# Patient Record
Sex: Female | Born: 1955 | Race: White | Hispanic: No | Marital: Married | Smoking: Never smoker
Health system: Southern US, Community
[De-identification: ages and names within clinical notes are randomized; demographics above are authoritative.]

## PROBLEM LIST (undated history)

## (undated) DIAGNOSIS — K219 Gastro-esophageal reflux disease without esophagitis: Secondary | ICD-10-CM

## (undated) DIAGNOSIS — IMO0001 Reserved for inherently not codable concepts without codable children: Secondary | ICD-10-CM

## (undated) DIAGNOSIS — T4145XA Adverse effect of unspecified anesthetic, initial encounter: Secondary | ICD-10-CM

## (undated) DIAGNOSIS — M1990 Unspecified osteoarthritis, unspecified site: Secondary | ICD-10-CM

## (undated) DIAGNOSIS — T8859XA Other complications of anesthesia, initial encounter: Secondary | ICD-10-CM

## (undated) DIAGNOSIS — G4730 Sleep apnea, unspecified: Secondary | ICD-10-CM

## (undated) DIAGNOSIS — R51 Headache: Secondary | ICD-10-CM

## (undated) DIAGNOSIS — F419 Anxiety disorder, unspecified: Secondary | ICD-10-CM

## (undated) DIAGNOSIS — F329 Major depressive disorder, single episode, unspecified: Secondary | ICD-10-CM

## (undated) DIAGNOSIS — F32A Depression, unspecified: Secondary | ICD-10-CM

## (undated) DIAGNOSIS — R519 Headache, unspecified: Secondary | ICD-10-CM

## (undated) DIAGNOSIS — I10 Essential (primary) hypertension: Secondary | ICD-10-CM

## (undated) DIAGNOSIS — R002 Palpitations: Secondary | ICD-10-CM

## (undated) HISTORY — DX: Reserved for inherently not codable concepts without codable children: IMO0001

## (undated) HISTORY — DX: Essential (primary) hypertension: I10

## (undated) HISTORY — PX: NOVASURE ABLATION: SHX5394

## (undated) HISTORY — DX: Palpitations: R00.2

## (undated) HISTORY — PX: KIDNEY STONE SURGERY: SHX686

## (undated) HISTORY — DX: Morbid (severe) obesity due to excess calories: E66.01

## (undated) HISTORY — DX: Depression, unspecified: F32.A

## (undated) HISTORY — DX: Gastro-esophageal reflux disease without esophagitis: K21.9

## (undated) HISTORY — DX: Major depressive disorder, single episode, unspecified: F32.9

## (undated) HISTORY — DX: Anxiety disorder, unspecified: F41.9

## (undated) HISTORY — PX: ANKLE FRACTURE SURGERY: SHX122

## (undated) HISTORY — PX: WISDOM TOOTH EXTRACTION: SHX21

## (undated) HISTORY — PX: OTHER SURGICAL HISTORY: SHX169

---

## 1999-06-06 ENCOUNTER — Encounter: Admission: RE | Admit: 1999-06-06 | Discharge: 1999-06-06 | Payer: Self-pay | Admitting: Obstetrics and Gynecology

## 1999-06-06 ENCOUNTER — Encounter: Payer: Self-pay | Admitting: Obstetrics and Gynecology

## 1999-10-30 ENCOUNTER — Other Ambulatory Visit: Admission: RE | Admit: 1999-10-30 | Discharge: 1999-10-30 | Payer: Self-pay | Admitting: Obstetrics and Gynecology

## 2000-04-10 ENCOUNTER — Encounter: Admission: RE | Admit: 2000-04-10 | Discharge: 2000-04-10 | Payer: Self-pay | Admitting: Emergency Medicine

## 2000-04-10 ENCOUNTER — Encounter: Payer: Self-pay | Admitting: Emergency Medicine

## 2007-06-05 ENCOUNTER — Encounter: Admission: RE | Admit: 2007-06-05 | Discharge: 2007-06-05 | Payer: Self-pay | Admitting: Emergency Medicine

## 2007-06-17 ENCOUNTER — Encounter: Admission: RE | Admit: 2007-06-17 | Discharge: 2007-06-17 | Payer: Self-pay | Admitting: Emergency Medicine

## 2007-06-17 ENCOUNTER — Encounter: Payer: Self-pay | Admitting: Family Medicine

## 2007-09-01 ENCOUNTER — Encounter: Admission: RE | Admit: 2007-09-01 | Discharge: 2007-09-01 | Payer: Self-pay | Admitting: Emergency Medicine

## 2007-09-03 ENCOUNTER — Encounter: Admission: RE | Admit: 2007-09-03 | Discharge: 2007-09-03 | Payer: Self-pay | Admitting: Cardiology

## 2008-08-26 ENCOUNTER — Ambulatory Visit: Payer: Self-pay | Admitting: Family Medicine

## 2008-08-26 DIAGNOSIS — I10 Essential (primary) hypertension: Secondary | ICD-10-CM | POA: Insufficient documentation

## 2008-08-26 DIAGNOSIS — M25519 Pain in unspecified shoulder: Secondary | ICD-10-CM | POA: Insufficient documentation

## 2009-01-13 ENCOUNTER — Ambulatory Visit: Payer: Self-pay | Admitting: Family Medicine

## 2009-01-13 DIAGNOSIS — J309 Allergic rhinitis, unspecified: Secondary | ICD-10-CM | POA: Insufficient documentation

## 2009-01-13 DIAGNOSIS — K219 Gastro-esophageal reflux disease without esophagitis: Secondary | ICD-10-CM | POA: Insufficient documentation

## 2009-03-15 ENCOUNTER — Ambulatory Visit: Payer: Self-pay | Admitting: Family Medicine

## 2009-03-15 DIAGNOSIS — R252 Cramp and spasm: Secondary | ICD-10-CM | POA: Insufficient documentation

## 2009-03-16 LAB — CONVERTED CEMR LAB
ALT: 14 units/L (ref 0–35)
AST: 13 units/L (ref 0–37)
CO2: 24 meq/L (ref 19–32)
Calcium: 9.1 mg/dL (ref 8.4–10.5)
Chloride: 107 meq/L (ref 96–112)
Lymphs Abs: 2.1 10*3/uL (ref 0.7–4.0)
Monocytes Relative: 6 % (ref 3–12)
Neutro Abs: 3.2 10*3/uL (ref 1.7–7.7)
Neutrophils Relative %: 55 % (ref 43–77)
Platelets: 295 10*3/uL (ref 150–400)
RBC: 4.55 M/uL (ref 3.87–5.11)
Sodium: 142 meq/L (ref 135–145)
Total Bilirubin: 0.5 mg/dL (ref 0.3–1.2)
Total CK: 54 units/L (ref 7–177)
Total Protein: 7 g/dL (ref 6.0–8.3)
WBC: 5.8 10*3/uL (ref 4.0–10.5)

## 2009-03-22 ENCOUNTER — Telehealth: Payer: Self-pay | Admitting: Family Medicine

## 2009-03-23 ENCOUNTER — Encounter: Payer: Self-pay | Admitting: Family Medicine

## 2009-03-26 LAB — CONVERTED CEMR LAB
Creatinine, Urine: 282.6 mg/dL
Microalb Creat Ratio: 3.5 mg/g (ref 0.0–30.0)
RBC / HPF: NONE SEEN (ref <3)

## 2009-05-23 ENCOUNTER — Ambulatory Visit: Payer: Self-pay | Admitting: Family Medicine

## 2009-05-23 DIAGNOSIS — M79609 Pain in unspecified limb: Secondary | ICD-10-CM | POA: Insufficient documentation

## 2009-05-24 ENCOUNTER — Encounter: Payer: Self-pay | Admitting: Family Medicine

## 2009-05-24 LAB — CONVERTED CEMR LAB
Folate: 10.9 ng/mL
LDL Cholesterol: 112 mg/dL — ABNORMAL HIGH (ref 0–99)
Triglycerides: 80 mg/dL (ref <150)
VLDL: 16 mg/dL (ref 0–40)
Vit D, 25-Hydroxy: 14 ng/mL — ABNORMAL LOW (ref 30–89)
Vitamin B-12: 237 pg/mL (ref 211–911)

## 2009-07-14 ENCOUNTER — Ambulatory Visit: Payer: Self-pay | Admitting: Family Medicine

## 2009-07-14 DIAGNOSIS — E559 Vitamin D deficiency, unspecified: Secondary | ICD-10-CM | POA: Insufficient documentation

## 2009-07-14 DIAGNOSIS — H6690 Otitis media, unspecified, unspecified ear: Secondary | ICD-10-CM | POA: Insufficient documentation

## 2009-07-15 ENCOUNTER — Encounter: Payer: Self-pay | Admitting: Family Medicine

## 2009-07-24 ENCOUNTER — Telehealth: Payer: Self-pay | Admitting: Family Medicine

## 2009-08-01 ENCOUNTER — Ambulatory Visit: Payer: Self-pay | Admitting: Family Medicine

## 2009-08-01 DIAGNOSIS — R002 Palpitations: Secondary | ICD-10-CM | POA: Insufficient documentation

## 2009-08-03 ENCOUNTER — Encounter: Payer: Self-pay | Admitting: Family Medicine

## 2009-08-04 ENCOUNTER — Encounter: Payer: Self-pay | Admitting: Family Medicine

## 2009-08-15 ENCOUNTER — Telehealth: Payer: Self-pay | Admitting: Family Medicine

## 2009-08-16 ENCOUNTER — Encounter: Payer: Self-pay | Admitting: Family Medicine

## 2009-08-22 ENCOUNTER — Telehealth: Payer: Self-pay | Admitting: Family Medicine

## 2009-10-25 ENCOUNTER — Telehealth: Payer: Self-pay | Admitting: Family Medicine

## 2009-11-03 ENCOUNTER — Telehealth: Payer: Self-pay | Admitting: Family Medicine

## 2009-11-07 ENCOUNTER — Ambulatory Visit: Payer: Self-pay | Admitting: Family Medicine

## 2009-11-07 DIAGNOSIS — F329 Major depressive disorder, single episode, unspecified: Secondary | ICD-10-CM | POA: Insufficient documentation

## 2009-11-07 DIAGNOSIS — F411 Generalized anxiety disorder: Secondary | ICD-10-CM | POA: Insufficient documentation

## 2010-01-09 ENCOUNTER — Ambulatory Visit: Payer: Self-pay | Admitting: Family Medicine

## 2010-01-12 ENCOUNTER — Telehealth: Payer: Self-pay | Admitting: Family Medicine

## 2010-07-15 ENCOUNTER — Encounter: Payer: Self-pay | Admitting: Emergency Medicine

## 2010-07-20 ENCOUNTER — Ambulatory Visit
Admission: RE | Admit: 2010-07-20 | Discharge: 2010-07-20 | Payer: Self-pay | Source: Home / Self Care | Attending: Family Medicine | Admitting: Family Medicine

## 2010-07-22 LAB — CONVERTED CEMR LAB
ALT: 12 units/L (ref 0–35)
AST: 11 units/L (ref 0–37)
Albumin: 4.6 g/dL (ref 3.5–5.2)
Alkaline Phosphatase: 71 units/L (ref 39–117)
MCHC: 32 g/dL (ref 30.0–36.0)
MCV: 90.4 fL (ref 78.0–100.0)
Platelets: 297 10*3/uL (ref 150–400)
Potassium: 4.2 meq/L (ref 3.5–5.3)
RBC: 4.98 M/uL (ref 3.87–5.11)
RDW: 13.9 % (ref 11.5–15.5)
Sodium: 139 meq/L (ref 135–145)
Total Bilirubin: 0.6 mg/dL (ref 0.3–1.2)
Total Protein: 7.2 g/dL (ref 6.0–8.3)

## 2010-07-24 ENCOUNTER — Telehealth (INDEPENDENT_AMBULATORY_CARE_PROVIDER_SITE_OTHER): Payer: Self-pay | Admitting: *Deleted

## 2010-07-24 ENCOUNTER — Telehealth: Payer: Self-pay | Admitting: Family Medicine

## 2010-07-24 NOTE — Progress Notes (Signed)
Summary: Cardiology Results  Phone Note Call from Patient Call back at Home Phone [phone]   Caller: Patient Call For: [name] MD Reason for Call: Lab or Test Results Summary of Call: Patient called and left voice message requesting cardiology test results.  Initial call taken by: Glendell Docker CMA,  August 22, 2009 1:47 PM  Follow-up for Phone Call        Call placed to patient she states she wore a heart monitor about 2 weeks, she states she was informed by Selena Batten the cardiologist that was to have read the test was out of town, and he had not signed off on it. Patient states she was calling back because she had not heard anything Follow-up by: Glendell Docker CMA,  August 22, 2009 1:50 PM  Additional Follow-up for Phone Call Additional follow up Details #1::        Please Call [facility] for report.   Additional Follow-up by: [name] MD,  August 22, 2009 2:48 PM    Additional Follow-up for Phone Call Additional follow up Details #2::    spoke with [facility] at [facility] medical records, she states the report has not been signed off on yet. She is going to check into and have the results faxed to the office once the report is signed off on Follow-up by: Glendell Docker CMA,  August 22, 2009 3:06 PM  Additional Follow-up for Phone Call Additional follow up Details #3:: Details for Additional Follow-up Action Taken: Call pt: WE called and got results today.  NSR, no arrythmias.  The palpitations is just an occ fast rate but not from an arrythmia which is good news. If still having the palpitations then can consider a betablocker to slow the HR down and often this will improve sxs. This would be a daily medication.   08/23/2009 @ 8:07am-Pt given results. Says it actually has not been happening lately and wonders if it was stressed related- I told her it could be but pt will hold off on the med for now and will call back if symptoms start back again. KJ  LPN Additional Follow-up by: [name] MD,  August 22, 2009 4:09 PM   Appended Document: Cardiology Results Pt [facility] stating that she refused medication for heart about 1 month ago but now thinks she needs to start med. Pt would like Rx sent if you still think she needs it. Please advise.Arvilla Market CMAMarcelino Duster October 20, 2009 2:42 PM   April 29, 20113:27 PM OK, med sent. F/U with Dr. B in 1 months to make sure working and tolerating it well. Jarrid Lienhard MD, Santina Evans [Prescriptions]   Pt aware of the above.Arvilla Market CMA, Michelle October 20, 2009 4:19 PM

## 2010-07-24 NOTE — Progress Notes (Signed)
  Phone Note Call from Patient   Summary of Call: Stopped the Metoprolol due to Dr. Cathey Endow thought it was due to caffeine and now heart racing has started back- instructed pt to restart the med and followup with Dr. Linford Arnold. Pt agrees to plan of care Initial call taken by: Kathlene November,  January 12, 2010 8:10 AM

## 2010-07-24 NOTE — Assessment & Plan Note (Signed)
Summary: LOM   Vital Signs:  Patient profile:   55 year old female Height:      67.2 inches Weight:      311 pounds Pulse rate:   84 / minute BP sitting:   130 / 71  (left arm) Cuff size:   regular  Vitals Entered By: Kathlene November (July 14, 2009 11:03 AM) CC: left earache- throbbing, popping and ringing in ear for 3 weeks now   Primary Care Provider:  [name] MD  CC:  left earache- throbbing and popping and ringing in ear for 3 weeks now.  History of Present Illness: left earache- throbbing, popping and ringing in ear for 3 weeks now.  Still feels stopped up.  Pain radiates into her neck.  Has had a dull HA behind the left eye. No fever. Feels "tight in her nose"  Inside of lips are cracking from sleeping wiht mouth open. No dysphagia.  No fever. Not taking any meds for it.  Feels like can't hear out of that ear well. No drainage. No worsenign sxs. Warm heating pad on that side of her face and ear helps her sxs. No ear trauma or prior hx of OM recently.    Current Medications (verified): 1)  Lisinopril 20 Mg Tabs (Lisinopril) .... Take One Tablet By Mouth Once A Day 2)  Calan Sr 240 Mg Cr-Tabs (Verapamil Hcl) .... Take 1 Tablet By Mouth Once A Day 3)  Torsemide 20 Mg Tabs (Torsemide) .... Take One Tablet By Mouth Once A Day As Needed 4)  Zyrtec Allergy 10 Mg Tabs (Cetirizine Hcl) .... Take One Tablet By Mouth Once A Day  Allergies (verified): No Known Drug Allergies  Comments:  Nurse/Medical Assistant: The patient's medications and allergies were reviewed with the patient and were updated in the Medication and Allergy Lists. 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  Has 1 daughter.   Physical Exam  General:  Well-developed,well-nourished,in no acute distress; alert,appropriate and cooperative throughout examination Head:   Normocephalic and atraumatic without obvious abnormalities. No apparent alopecia or balding. Eyes:  No corneal or conjunctival inflammation noted. EOMI. Perrla. Ears:  External ear exam shows no significant lesions or deformities.  Right TM is clear.  Left TM is retracted with yellow fluid. No ertythem or active driange.  Nose:  External nasal examination shows no deformity or inflammation. Nasal mucosa are pink and moist without lesions or exudates. Mouth:  good dentition and no gingival abnormalities.  Left tonsils is swollen.  Neck:  No deformities, masses, or tenderness noted. Lungs:  Normal respiratory effort, chest expands symmetrically. Lungs are clear to auscultation, no crackles or wheezes. Heart:  Normal rate and regular rhythm. S1 and S2 normal without gallop, murmur, click, rub or other extra sounds. Skin:  no rashes.   Cervical Nodes:  No lymphadenopathy noted Psych:  Cognition and judgment appear intact. Alert and cooperative with normal attention span and concentration. No apparent delusions, illusions, hallucinations   Impression & Recommendations:  Problem # 1:  LOM (ICD-382.9) Will treat with augmentin and nasal steroid. Call if not better in one week. Will refer to ENT if not better.  Her updated medication list for this problem includes:    Augmentin 875-125 Mg Tabs (Amoxicillin-pot clavulanate) .Marland Kitchen... Take 1 tablet by mouth two times a day for 10 days  Complete Medication List:  1)  Lisinopril 20 Mg Tabs (Lisinopril) .... Take one tablet by mouth once a day 2)  Calan Sr 240 Mg Cr-tabs (Verapamil hcl) .... Take 1 tablet by mouth once a day 3)  Torsemide 20 Mg Tabs (Torsemide) .... Take one tablet by mouth once a day as needed 4)  Zyrtec Allergy 10 Mg Tabs (Cetirizine hcl) .... Take one tablet by mouth once a day 5)  Augmentin 875-125 Mg Tabs (Amoxicillin-pot clavulanate) .... Take 1 tablet by mouth two times a day for 10 days 6)  Fluticasone Propionate 50 Mcg/act Susp  (Fluticasone propionate) .... 2 sprays in each nostril dialy  Other Orders: T-Vitamin D (25-Hydroxy) ([id number]) Prescriptions: FLUTICASONE PROPIONATE 50 MCG/ACT SUSP (FLUTICASONE PROPIONATE) 2 sprays in each nostril dialy  #1 bottle x 0   Entered and Authorized by:   [name] MD   Signed by:   [name] MD on 07/14/2009   Method used:   [facility]  [address] [phone]       [address]       [place], [place]  [address]       Ph: [phone] or [phone]       Fax: [phone]   RxID:   [id number] AUGMENTIN 875-125 MG TABS (AMOXICILLIN-POT CLAVULANATE) Take 1 tablet by mouth two times a day for 10 days  #20 x 0   Entered and Authorized by:   [name] MD   Signed by:   [name] MD on 07/14/2009   Method used:   [facility] [phone]* (retail)       [address]       [place], [place]  [address]       Ph: [phone] or [phone]       Fax: [phone]   RxID:   [id number]

## 2010-07-24 NOTE — Procedures (Signed)
Summary: [facility]   Imported By: Lanelle Bal 08/28/2009 14:18:34  _____________________________________________________________________  External Attachment:    Type:   Image     Comment:   External Document

## 2010-07-24 NOTE — Letter (Signed)
Summary: [facility]  [facility]   Imported By: Lanelle Bal 08/23/2009 10:13:15  _____________________________________________________________________  External Attachment:    Type:   Image     Comment:   External Document

## 2010-07-24 NOTE — Progress Notes (Signed)
Summary: No changes since starting metoprolol   Phone Note Call from Patient   Caller: Patient Summary of Call: Pt called stating she started the Metoprolol as directed for rapid heart beat. Pt states she has not noticed any changes in the heart rate or palpataions. She states she is still having rapid heart beat in the morning. I advised Pt that she has not been on medication very long and it may not have had enough time to get into her system but I would ask Dr. Cathey Endow if she agrees or has any other suggestions. Please advise. Initial call taken by: Payton Spark CMA,  Nov 03, 2009 9:39 AM  Follow-up for Phone Call        she has not been seen since Feb. Stay on current dose and schedule OV next wk. Follow-up by: Seymour Bars DO,  Nov 03, 2009 10:11 AM     Appended Document: No changes since starting metoprolol  Pt aware. Will call back to schedule apt

## 2010-07-24 NOTE — Progress Notes (Signed)
Summary: BP and meds  Phone Note Call from Patient Call back at Home Phone [phone]   Caller: Patient Call For: [name] MD Summary of Call: Pt calls and states you put her on Metoprolol and she was wondering does she need to keep taking her regular BP meds along with this. She says at times her BP is 112/72 and didn't want it to drop real low so confused as to what to do. Please advise Initial call taken by: Kathlene November,  Oct 25, 2009 8:50 AM  Follow-up for Phone Call        Yes this would be in addition to other meds.  BP is OK but if feeling lightheaded then can cut the lisinopril in half and this should compensate for the new medicine.  Follow-up by: [name] MD,  Oct 25, 2009 10:06 AM  Additional Follow-up for Phone Call Additional follow up Details #1::        Pt notified of above instructions. kJ LPN Additional Follow-up by: Kathlene November,  Oct 25, 2009 10:17 AM

## 2010-07-24 NOTE — Progress Notes (Signed)
Summary: Referral to ENT  Phone Note Call from Patient Call back at Home Phone [phone]   Caller: Patient Call For: [name] MD Summary of Call: pt seen and given antibiotic for her ear and has finished the med but still having pain behind the ear and was told you would refer if still hurting Initial call taken by: Kathlene November,  July 24, 2009 11:26 AM  Follow-up for Phone Call        OK. printed.  Follow-up by: [name] MD,  July 24, 2009 11:58 AM

## 2010-07-24 NOTE — Assessment & Plan Note (Signed)
Summary: f/u palptations/ wt   Vital Signs:  Patient profile:   55 year old female Height:      67.2 inches Weight:      314 pounds BMI:     49.06 O2 Sat:      100 % on Room air Pulse rate:   65 / minute BP sitting:   139 / 75  (left arm) Cuff size:   large  Vitals Entered By: Payton Spark CMA (Nov 07, 2009 11:03 AM)  O2 Flow:  Room air CC: Discuss palpitations and metoprolol   Primary Care Provider:  [name] MD  CC:  Discuss palpitations and metoprolol.  History of Present Illness: 55 yo WF presents for heart palpitations.  She wore  a heart monitor 2 mos ago and it was NORMAL.  She was tried on Metoprolol for the past month.  She thought that the metoprolol had helped.  She is having some palptations but they are not everyday.  She is only having them in the middle of the night which she thinks is from worrying too much.  She has GERD which sometimes causes CP or DOE.  She did not see the cardiologsit back after she did the testing.   She had normal labs in Feb - CBC, TSH, CMP. Her EKG was normal.  She is under a lot of stress lately.  She admits to feeling anxious.  She drinks a lot of caffeine.  She does not smoke.  Lately, she has not been feeling acid reflux with her heart palpitations but she is morbidly obese.    Current Medications (verified): 1)  Lisinopril 20 Mg Tabs (Lisinopril) .... Take One Tablet By Mouth Once A Day 2)  Calan Sr 240 Mg Cr-Tabs (Verapamil Hcl) .... Take 1 Tablet By Mouth Once A Day 3)  Torsemide 20 Mg Tabs (Torsemide) .... Take One Tablet By Mouth Once A Day As Needed 4)  Zyrtec Allergy 10 Mg Tabs (Cetirizine Hcl) .... Take One Tablet By Mouth Once A Day 5)  Fluticasone Propionate 50 Mcg/act Susp (Fluticasone Propionate) .... 2 Sprays in Each Nostril Dialy 6)  Metoprolol Succinate 25 Mg Xr24h-Tab (Metoprolol Succinate) .... Take 1 Tablet By Mouth Once A Day in The Am  Allergies (verified): No Known Drug Allergies  Past  History:  Past Medical History: Stress test 2009 depression / anxiety morbid obesity HTN reflux esophagitis  Past Surgical History: Reviewed history from 08/26/2008 and no changes required. None  Social History: Reviewed history from 08/01/2009 and no changes required. 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   Review of Systems      See HPI  Physical Exam  General:  morbidly obese WF in NAD Mouth:  pharynx pink and moist.   Neck:  no masses.   Lungs:  Normal respiratory effort, chest expands symmetrically. Lungs are clear to auscultation, no crackles or wheezes. Heart:  Normal rate and regular rhythm. S1 and S2 normal without gallop, murmur, click, rub or other extra sounds. Extremities:  trace ankle edema bilat Neurologic:  no tremor Skin:  color normal.   Psych:  flat affect.     Impression & Recommendations:  Problem # 1:  PALPITATIONS (ICD-785.1) Had a normal cardiac monitoring test in March and normal labs in Feb.  Improved a little on metoprolol.  Only occuring in the middle of th night suggesting association with anxiety or acid reflux.  Avoid caffeine  and late night eating.  Will work on stress reduction and treat mood (see #2).  Continue BB for now. Her updated medication list for this problem includes:    Metoprolol Succinate 25 Mg Xr24h-tab (Metoprolol succinate) .Marland Kitchen... Take 1 tablet by mouth once a day in the am  Problem # 2:  DEPRESSION, MILD (ICD-311) PHQ-9 score of 8 c/w mild depression with some anxiety which may be triggering nighttime heart palpitaitons and binge eating.   Will start her on Wellbutrin once a day.  Call if any problems.  RTC in 1 month to see how she is doing and work on wt [employer]. Her updated medication list for this problem includes:    Wellbutrin Xl 150 Mg Xr24h-tab (Bupropion hcl) .Marland Kitchen... 1 tab by mouth qam  Problem # 3:  MORBID OBESITY (ICD-278.01) BMI 49 c/w class III  obesity. Spent 20 min face to face counseling about mood, weight managment and a plan to move her towards a healthier weight.  Complete Medication List: 1)  Lisinopril 20 Mg Tabs (Lisinopril) .... Take one tablet by mouth once a day 2)  Calan Sr 240 Mg Cr-tabs (Verapamil hcl) .... Take 1 tablet by mouth once a day 3)  Torsemide 20 Mg Tabs (Torsemide) .... Take one tablet by mouth once a day as needed 4)  Zyrtec Allergy 10 Mg Tabs (Cetirizine hcl) .... Take one tablet by mouth once a day 5)  Fluticasone Propionate 50 Mcg/act Susp (Fluticasone propionate) .... 2 sprays in each nostril dialy 6)  Metoprolol Succinate 25 Mg Xr24h-tab (Metoprolol succinate) .... Take 1 tablet by mouth once a day in the am 7)  Wellbutrin Xl 150 Mg Xr24h-tab (Bupropion hcl) .Marland Kitchen.. 1 tab by mouth qam  Patient Instructions: 1)  Stay on current meds. 2)  Add Wellbutrin once a day for mood. 3)  Work on [employer], regular physical activity. 4)  Avoid caffeine and late night eating. 5)  REturn for f/u in 1 month. Prescriptions: WELLBUTRIN XL 150 MG XR24H-TAB (BUPROPION HCL) 1 tab by mouth qAM  #30 x 1   Entered and Authorized by:   Seymour Bars DO   Signed by:   Seymour Bars DO on 11/07/2009   Method used:   [facility] [phone]* (retail)       [address]       [place], [place]  [address]       Ph: [phone] or [phone]       Fax: [phone]   RxID:   [id number]

## 2010-07-24 NOTE — Progress Notes (Signed)
Summary: Results  Phone Note Call from Patient Call back at Home Phone [phone]   Caller: Patient Call For: [name] MD Summary of Call: Pt calls and wanted to know recommendations from ENT that she went and seen and also results of halter monitor that she wore Initial call taken by: Kathlene November,  August 15, 2009 10:00 AM  Follow-up for Phone Call        I don't have the holter monitor results yet. Can we call and get those. Need to call [facility] if hasn't heard from MD. From the note I got he wanted to review your MRI first and then call you.  Follow-up by: [name] MD,  August 15, 2009 11:18 AM  Additional Follow-up for Phone Call Additional follow up Details #1::        Diane from [facility] stating MD has not signed off on report. Diane will have MD sign and fax  on Thurs. Additional Follow-up by: Payton Spark CMA,  August 15, 2009 4:20 PM    Additional Follow-up for Phone Call Additional follow up Details #2::    Pt notified that as soon as MD at cardiologists signs the report we will get a copy and then call her with results. Instructed her to call [facility] and ask MD she seen what the plan is once reviewed the MRI- pt agrees with plan Follow-up by: Kathlene November,  August 15, 2009 4:23 PM

## 2010-07-24 NOTE — Letter (Signed)
Summary: Depression Questionnaire  Depression Questionnaire   Imported By: Lanelle Bal 11/16/2009 13:19:20  _____________________________________________________________________  External Attachment:    Type:   Image     Comment:   External Document

## 2010-07-24 NOTE — Consult Note (Signed)
Summary: [facility]  [facility]   Imported By: Lanelle Bal 08/11/2009 10:13:23  _____________________________________________________________________  External Attachment:    Type:   Image     Comment:   External Document

## 2010-07-24 NOTE — Assessment & Plan Note (Signed)
Summary: f/u mood/ wt   Vital Signs:  Patient profile:   55 year old female Height:      67.2 inches Weight:      320 pounds BMI:     50.00 O2 Sat:      98 % on Room air Pulse rate:   64 / minute BP sitting:   144 / 72  (left arm) Cuff size:   large  Vitals Entered By: Payton Spark CMA (January 09, 2010 4:18 PM)  O2 Flow:  Room air CC: F/U mood and weight.   Primary Care Provider:  [name] MD  CC:  F/U mood and weight..  History of Present Illness: 55 yo WF presents for f/u mood and weight.  She is on Wellbutrin for her mood.  It has helped.  Her PHQ9 has improved from and 8-->5.  Her home BPs have been <140/90.  She wants to come off Metoprolol becuase she has cut back on her caffeiene which has improved her heart palpitations.  She takes other BP meds.  She has failed to lose any weight.      Current Medications (verified): 1)  Lisinopril 20 Mg Tabs (Lisinopril) .... Take One Tablet By Mouth Once A Day 2)  Calan Sr 240 Mg Cr-Tabs (Verapamil Hcl) .... Take 1 Tablet By Mouth Once A Day 3)  Torsemide 20 Mg Tabs (Torsemide) .... Take One Tablet By Mouth Once A Day As Needed 4)  Zyrtec Allergy 10 Mg Tabs (Cetirizine Hcl) .... Take One Tablet By Mouth Once A Day 5)  Fluticasone Propionate 50 Mcg/act Susp (Fluticasone Propionate) .... 2 Sprays in Each Nostril Dialy 6)  Metoprolol Succinate 25 Mg Xr24h-Tab (Metoprolol Succinate) .... Take 1 Tablet By Mouth Once A Day in The Am 7)  Wellbutrin Xl 150 Mg Xr24h-Tab (Bupropion Hcl) .Marland Kitchen.. 1 Tab By Mouth Qam  Allergies (verified): No Known Drug Allergies  Past History:  Past Medical History: Reviewed history from 11/07/2009 and no changes required. Stress test 2009 depression / anxiety morbid obesity HTN reflux esophagitis  Past Surgical History: Reviewed history from 08/26/2008 and no changes required. None  Family History: Reviewed history from 08/01/2009 and no changes required. PGM with cancer Father  with MI prior age 71, decreased.  Family History of CAD Female 1st degree relative <50 M aunt with DM  Mother with pacemaker for irregular HR.   Social History: Reviewed history from 08/01/2009 and no changes required. 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   Review of Systems      See HPI  Physical Exam  General:  alert, well-developed, well-nourished, and well-hydrated.  obese Head:  normocephalic and atraumatic.   Mouth:  good dentition and pharynx pink and moist.   Neck:  no masses.   Lungs:  Normal respiratory effort, chest expands symmetrically. Lungs are clear to auscultation, no crackles or wheezes. Heart:  Normal rate and regular rhythm. S1 and S2 normal without gallop, murmur, click, rub or other extra sounds. Skin:  color normal.   Cervical Nodes:  No lymphadenopathy noted Psych:  good eye contact, not anxious appearing, and not depressed appearing.     Impression & Recommendations:  Problem # 1:  PALPITATIONS (ICD-785.1) Will stop the Metoprolol.  She is on Verapamil anyway so should not need this.  Had a normal cardiac w/u. The following medications were removed from the medication list:    Metoprolol Succinate 25 Mg  Xr24h-tab (Metoprolol succinate) .Marland Kitchen... Take 1 tablet by mouth once a day in the am  Problem # 2:  ALLERGIC RHINITIS (ICD-477.9) Continue zyrtec and Flonase. Her updated medication list for this problem includes:    Zyrtec Allergy 10 Mg Tabs (Cetirizine hcl) .Marland Kitchen... Take one tablet by mouth once a day    Fluticasone Propionate 50 Mcg/act Susp (Fluticasone propionate) .Marland Kitchen... 2 sprays in each nostril dialy  Problem # 3:  DEPRESSION, MILD (ICD-311) PHQ -9 score of 5, down from 8.  Continue Wellbutrin.  Work on wt loss/ exercise. F/U in 4 mos with dr Judie Petit. Her updated medication list for this problem includes:    Wellbutrin Xl 150 Mg Xr24h-tab (Bupropion hcl) .Marland Kitchen... 1 tab by mouth qam  Complete  Medication List: 1)  Lisinopril 20 Mg Tabs (Lisinopril) .... 1/2 tab by mouth daily 2)  Calan Sr 240 Mg Cr-tabs (Verapamil hcl) .... Take 1 tablet by mouth once a day 3)  Torsemide 20 Mg Tabs (Torsemide) .... Take one tablet by mouth once a day as needed 4)  Zyrtec Allergy 10 Mg Tabs (Cetirizine hcl) .... Take one tablet by mouth once a day 5)  Fluticasone Propionate 50 Mcg/act Susp (Fluticasone propionate) .... 2 sprays in each nostril dialy 6)  Wellbutrin Xl 150 Mg Xr24h-tab (Bupropion hcl) .Marland Kitchen.. 1 tab by mouth qam  Patient Instructions: 1)  Stop Metoprolol. 2)  Return for follow up with Dr Linford Arnold in 4 mos. Prescriptions: WELLBUTRIN XL 150 MG XR24H-TAB (BUPROPION HCL) 1 tab by mouth qAM  #30 x 3   Entered and Authorized by:   Seymour Bars DO   Signed by:   Seymour Bars DO on 01/09/2010   Method used:   [facility]        [facility] [phone]* (retail)       [address]       [place], [place]  [address]       Ph: [phone] or [phone]       Fax: [phone]   RxID:   [id number] LISINOPRIL 20 MG TABS (LISINOPRIL) 1/2 tab by mouth daily  #30 x 3   Entered and Authorized by:   Seymour Bars DO   Signed by:   Seymour Bars DO on 01/09/2010   Method used:   [facility]        [facility] [phone]* (retail)       [address]       [place], [place]  [address]       Ph: [phone] or [phone]       Fax: [phone]   RxID:   [id number] CALAN SR 240 MG CR-TABS (VERAPAMIL HCL) Take 1 tablet by mouth once a day  #60 Tablet x 6   Entered and Authorized by:   Seymour Bars DO   Signed by:   Seymour Bars DO on 01/09/2010   Method used:   [facility] [phone]* (retail)       [address]       [place], [place]  [address]       Ph: [phone] or [phone]       Fax: [phone]   RxID:   [id number]

## 2010-07-24 NOTE — Assessment & Plan Note (Signed)
Summary: Palpitations   Vital Signs:  Patient profile:   55 year old female Height:      67.2 inches Weight:      308.75 pounds Pulse rate:   87 / minute BP sitting:   136 / 78  Vitals Entered By: Kandice Hams (August 01, 2009 2:51 PM) CC: c/o heart racing, early in am heartburn   Primary Care Provider:  [name] MD  CC:  c/o heart racing and early in am heartburn.  History of Present Illness: c/o heart racing, early in am heartburn. Will wake up in the AM and feel heartracing for about 1 minutes. Last night happened and checked her BP and it was elevated. Waited a few mintues and then the BP went back down.  Wore a heart monitor about 2 years and told had an irregular heartbeat and then saw Cardiology who felt there was nothing wrong.  Was having "skipped beast senastion" back then. No SOB but does have nasal congestion. No chage in Caffeine intake.  Also had a treadmill stress test about 2 years ago. Heart racing started a couple of weeks ago and has been most mornings but not every morning. No CP.    Allergies: No Known Drug Allergies  Past History:  Past Medical History: Last updated: 08/26/2008 Stress test 2009  Family History: Last updated: 08/01/2009 PGM with cancer Father with MI prior age 80, decreased.  Family History of CAD Female 1st degree relative <50 M aunt with DM  Mother with pacemaker for irregular HR.   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   Family History: PGM with cancer Father with MI prior age 65, decreased.  Family History of CAD Female 1st degree relative <50 M aunt with DM  Mother with pacemaker for irregular HR.   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  Married to [employer].  Has 1 daughter.   Physical Exam  General:  Well-developed,well-nourished,in no acute distress; alert,appropriate  and cooperative throughout examination Head:  Normocephalic and atraumatic without obvious abnormalities. No apparent alopecia or balding. Eyes:  No corneal or conjunctival inflammation noted. EOMI. Perrla.  Ears:  External ear exam shows no significant lesions or deformities.  Otoscopic examination reveals clear canals, tympanic membranes are intact bilaterally without bulging, retraction, inflammation or discharge. Hearing is grossly normal bilaterally. Nose:  External nasal examination shows no deformity or inflammation. Nasal mucosa are pink and moist without lesions or exudates. Mouth:  Oral mucosa and oropharynx without lesions or exudates.  Teeth in good repair. Neck:  No deformities, masses, or tenderness noted. No Tm.  Lungs:  Normal respiratory effort, chest expands symmetrically. Lungs are clear to auscultation, no crackles or wheezes. Heart:  Normal rate and regular rhythm. S1 and S2 normal without gallop, murmur, click, rub or other extra sounds. Pulses:  Radial 2+  Extremities:  No LE edema.  Neurologic:  alert & oriented X3.   Skin:  no rashes.   Psych:  Cognition and judgment appear intact. Alert and cooperative with normal attention span and concentration. No apparent delusions, illusions, hallucinations   Impression & Recommendations:  Problem # 1:  PALPITATIONS (ICD-785.1) EKG shows NSR, rate of 72bpm.  No acute changes.   Will rule out thyroid d/o or anemia. If normal then wil schedule for Heart monitor.   Does have some anxiety but doesn't  feel anything has changed from her baseline.  Orders: T-CBC No Diff ([id number]) T-Comprehensive Metabolic Panel ([id number]) T-TSH ([id number]) EKG w/ Interpretation (93000)  Complete Medication List: 1)  Lisinopril 20 Mg Tabs (Lisinopril) .... Take one tablet by mouth once a day 2)  Calan Sr 240 Mg Cr-tabs (Verapamil hcl) .... Take 1 tablet by mouth once a day 3)  Torsemide 20 Mg Tabs (Torsemide) .... Take one tablet by  mouth once a day as needed 4)  Zyrtec Allergy 10 Mg Tabs (Cetirizine hcl) .... Take one tablet by mouth once a day 5)  Augmentin 875-125 Mg Tabs (Amoxicillin-pot clavulanate) .... Take 1 tablet by mouth two times a day for 10 days 6)  Fluticasone Propionate 50 Mcg/act Susp (Fluticasone propionate) .... 2 sprays in each nostril dialy

## 2010-07-24 NOTE — Procedures (Signed)
Summary: [facility]  [facility]   Imported By: Lanelle Bal 08/29/2009 13:30:39  _____________________________________________________________________  External Attachment:    Type:   Image     Comment:   External Document

## 2010-07-25 ENCOUNTER — Encounter: Payer: Self-pay | Admitting: Family Medicine

## 2010-07-25 ENCOUNTER — Telehealth: Payer: Self-pay | Admitting: Family Medicine

## 2010-07-25 LAB — CONVERTED CEMR LAB
ALT: 15 units/L (ref 0–35)
Alkaline Phosphatase: 75 units/L (ref 39–117)
CO2: 23 meq/L (ref 19–32)
Cholesterol: 166 mg/dL (ref 0–200)
Creatinine, Ser: 0.94 mg/dL (ref 0.40–1.20)
HCT: 43.2 % (ref 36.0–46.0)
LDL Cholesterol: 96 mg/dL (ref 0–99)
MCHC: 32.9 g/dL (ref 30.0–36.0)
MCV: 91.9 fL (ref 78.0–100.0)
Platelets: 270 10*3/uL (ref 150–400)
Sodium: 142 meq/L (ref 135–145)
Total Bilirubin: 0.7 mg/dL (ref 0.3–1.2)
Total CHOL/HDL Ratio: 2.9
Triglycerides: 66 mg/dL (ref <150)
VLDL: 13 mg/dL (ref 0–40)
WBC: 6.2 10*3/uL (ref 4.0–10.5)

## 2010-07-26 NOTE — Assessment & Plan Note (Signed)
Summary: gastro issues, HTN   Vital Signs:  Patient profile:   55 year old female Height:      67.2 inches Weight:      322 pounds Pulse rate:   58 / minute BP sitting:   148 / 77  (right arm) Cuff size:   large  Vitals Entered By: Avon Gully CMA, Duncan Dull) (July 20, 2010 2:50 PM) CC: constipation, has the urge but cant go, since mon   Primary Care Provider:  [name] MD  CC:  constipation, has the urge but cant go, and since mon.  History of Present Illness: constipation, has the urge but cant go, since mon ( 5 days agO). Stomach churns and hurts but then either doesn't have a stool or passes a tiney amount. Tried miralax and sennot occ. Gets constipated occ with her meds.  Now afraid to eat.  No nausea or vomiting. No severe or sharp pain. Blood with wiping from straining.   Current Medications (verified): 1)  Lisinopril 20 Mg Tabs (Lisinopril) .... 1/2 Tab By Mouth Daily 2)  Calan Sr 240 Mg Cr-Tabs (Verapamil Hcl) .... Take 1 Tablet By Mouth Once A Day 3)  Torsemide 20 Mg Tabs (Torsemide) .... Take One Tablet By Mouth Once A Day As Needed 4)  Zyrtec Allergy 10 Mg Tabs (Cetirizine Hcl) .... Take One Tablet By Mouth Once A Day 5)  Fluticasone Propionate 50 Mcg/act Susp (Fluticasone Propionate) .... 2 Sprays in Each Nostril Dialy 6)  Wellbutrin Xl 150 Mg Xr24h-Tab (Bupropion Hcl) .Marland Kitchen.. 1 Tab By Mouth Qam 7)  Metoprolol Succinate 25 Mg Xr24h-Tab (Metoprolol Succinate) .... Take One Tablet By Mouth in The Am 8)  Prilosec 20 Mg Cpdr (Omeprazole)  Allergies (verified): No Known Drug Allergies  Past History:  Past Medical History: Last updated: 11/07/2009 Stress test 2009 depression / anxiety morbid obesity HTN reflux esophagitis  Past Surgical History: Last updated: 08/26/2008 None  Family History: Last updated: 08/01/2009 PGM with cancer Father with MI prior age 109, decreased.  Family History of CAD Female 1st degree relative <50 M aunt with DM    Mother with pacemaker for irregular HR.   Physical Exam  General:  Well-developed,well-nourished,in no acute distress; alert,appropriate and cooperative throughout examination. Morbidly obese.  Lungs:  Normal respiratory effort, chest expands symmetrically. Lungs are clear to auscultation, no crackles or wheezes. Heart:  Normal rate and regular rhythm. S1 and S2 normal without gallop, murmur, click, rub or other extra sounds. Abdomen:  Bowel sounds positive,abdomen soft and non-tender without masses, organomegaly or hernias noted.   Impression & Recommendations:  Problem # 1:  CONSTIPATION (ICD-564.00) Assessment New Dsicussed acute regimen of mirlax two times a day and sennokot 2 tabs three times a day. Also try an enema and rpeat if needed over the weekend. If nto better return on Monday. Discussed oncisder donig rectal exam in the office but pt preferred to try a bowel regimen first adn then return if not better. Also inc water and natural fiber in the diet as well. Stick with soft foods and liquids until the bowels are moving more normallly. consider KUB if no resolution of sxs. No red flag sxs for abdominal pain to suggest illeus, etc.   Problem # 2:  ESSENTIAL HYPERTENSION, BENIGN (ICD-401.1) Inc lisinopril to whole tab. Recheck BP in one month.  Her updated medication list for this problem includes:    Lisinopril 20 Mg Tabs (Lisinopril) .Marland Kitchen... Take 1 tablet by mouth once a day  Calan Sr 240 Mg Cr-tabs (Verapamil hcl) .Marland Kitchen... Take 1 tablet by mouth once a day    Torsemide 20 Mg Tabs (Torsemide) .Marland Kitchen... Take one tablet by mouth once a day as needed    Metoprolol Succinate 25 Mg Xr24h-tab (Metoprolol succinate) .Marland Kitchen... Take one tablet by mouth in the am  Orders: T-Comprehensive Metabolic Panel [id number]) T-Lipid Profile ([id number]) T-TSH ([id number])  BP today: 148/77 Prior BP: 144/72 (01/09/2010)  Prior 10 Yr Risk Heart Disease: Not enough information (05/23/2009)  Labs  Reviewed: K+: 4.2 (08/01/2009) Creat: : 0.77 (08/01/2009)   Chol: 181 (05/24/2009)   HDL: 53 (05/24/2009)   LDL: 112 (05/24/2009)   TG: 80 (05/24/2009)  Complete Medication List: 1)  Lisinopril 20 Mg Tabs (Lisinopril) .... Take 1 tablet by mouth once a day 2)  Calan Sr 240 Mg Cr-tabs (Verapamil hcl) .... Take 1 tablet by mouth once a day 3)  Torsemide 20 Mg Tabs (Torsemide) .... Take one tablet by mouth once a day as needed 4)  Zyrtec Allergy 10 Mg Tabs (Cetirizine hcl) .... Take one tablet by mouth once a day 5)  Fluticasone Propionate 50 Mcg/act Susp (Fluticasone propionate) .... 2 sprays in each nostril dialy 6)  Wellbutrin Xl 150 Mg Xr24h-tab (Bupropion hcl) .Marland Kitchen.. 1 tab by mouth qam 7)  Metoprolol Succinate 25 Mg Xr24h-tab (Metoprolol succinate) .... Take one tablet by mouth in the am 8)  Prilosec 20 Mg Cpdr (Omeprazole)  Other Orders: T-CBC No Diff ([id number])  Patient Instructions: 1)  Increase the miralax to two times a day  2)  Can increase dose on the Sennokot as well 3)  Try an enema and can repeat 4 hours later if not significant results.  4)  Drink pleny of water and when you eat try to eat soup broth, jello and soft digestable foods.    Orders Added: 1)  T-Comprehensive Metabolic Panel [[id number]] 2)  T-Lipid Profile [[id number]] 3)  T-TSH [[id number]] 4)  T-CBC No Diff [[id number]] 5)  Est. Patient Level IV [02725]

## 2010-08-01 NOTE — Progress Notes (Signed)
----   Converted from flag ---- ---- 07/20/2010 5:30 PM, [name] MD wrote: Call pt: Need to inc lisinopril to whole tab.  Last 2 times here BP has been high. Then f/u for BP recheck in one month. ------------------------------ 07/24/10 acm 11:22 pt notified

## 2010-08-01 NOTE — Progress Notes (Signed)
Summary: cold sx  Phone Note Call from Patient   Caller: Patient Call For: [name] MD Summary of Call: pt would like to know what she can take for a cold that will not make her BP go up. Pt has head congestion and drainage Initial call taken by: Avon Gully CMA, Duncan Dull),  July 25, 2010 9:25 AM  Follow-up for Phone Call        [place] cold med. The pharmacist can help her find it if has difficulty finding it.  Follow-up by: [name] MD,  July 25, 2010 10:12 AM  Additional Follow-up for Phone Call Additional follow up Details #1::        pt notified Additional Follow-up by: Avon Gully CMA, Duncan Dull),  July 25, 2010 10:34 AM

## 2010-08-01 NOTE — Progress Notes (Signed)
Summary: [facility]-Heart racing  Phone Note Call from Patient Call back at [facility] Phone [phone]   Caller: Patient Reason for Call: Talk to Nurse Summary of Call: Pt was given metoprolol for racing heart and has been doing well.  About 2 weeks ago, pt's heart began racing again at about 4 or 5 o'clock in the morning.  Pt has no new changes in routine, getting same amount of sleep, no new or increased  stress.  Pt continues to take 1/2 tab of lisinopril--BP this morning was 122/66. Initial call taken by: Francee Piccolo CMA Duncan Dull),  July 24, 2010 9:05 AM  Follow-up for Phone Call        Will increase her metoprolol and rec f/u in 1-2 mohths.  Follow-up by: [name] MD,  July 24, 2010 12:45 PM  Additional Follow-up for Phone Call Additional follow up Details #1::        Advised pt of the above.  Also clarified lisinopril directions.  Pt voices understanding and is agreeable. Additional Follow-up by: Francee Piccolo CMA Duncan Dull),  July 24, 2010 2:47 PM    New/Updated Medications: TOPROL XL 50 MG XR24H-TAB (METOPROLOL SUCCINATE) Take 1 tablet by mouth once a day Prescriptions: TOPROL XL 50 MG XR24H-TAB (METOPROLOL SUCCINATE) Take 1 tablet by mouth once a day  #90 x 1   Entered and Authorized by:   [name] MD   Signed by:   [name] MD on 07/24/2010   Method used:   Electronically to        [facility] [phone]* (retail)       [address]       [place], [place]  [address]       Ph: [phone] or [phone]       Fax: [phone]   RxID:   [id number]

## 2010-08-17 ENCOUNTER — Ambulatory Visit (INDEPENDENT_AMBULATORY_CARE_PROVIDER_SITE_OTHER): Payer: BC Managed Care – PPO | Admitting: Family Medicine

## 2010-08-17 ENCOUNTER — Encounter: Payer: Self-pay | Admitting: Family Medicine

## 2010-08-17 DIAGNOSIS — I10 Essential (primary) hypertension: Secondary | ICD-10-CM

## 2010-08-17 DIAGNOSIS — R002 Palpitations: Secondary | ICD-10-CM

## 2010-08-21 NOTE — Assessment & Plan Note (Signed)
Summary: HTN, palps   Vital Signs:  Patient profile:   55 year old female Height:      67.2 inches Weight:      324.50 pounds BMI:     50.70 Temp:     98.4 degrees F oral Pulse rate:   64 / minute BP sitting:   147 / 78  (right arm) Cuff size:   large  Vitals Entered By: Glendell Docker CMA (August 17, 2010 3:10 PM)  Serial Vital Signs/Assessments:  Time      Position  BP       Pulse  Resp  Temp     By 3:40 PM             142/78                         Glendell Docker CMA  CC: follow-up visit   Primary Care Provider:  [name] MD  CC:  follow-up visit.  History of Present Illness: BP has been running 106-126 at home.  She did bring in her home cuff today.  Her machine runs about 10 mm pressure less than our machine.    Still getting the heart racing. Fluttering for a few seconds.  Happened today several times. No caffeine today.    Allergies (verified): No Known Drug Allergies  Physical Exam  General:  Well-developed,well-nourished,in no acute distress; alert,appropriate and cooperative throughout examination Lungs:  Normal respiratory effort, chest expands symmetrically. Lungs are clear to auscultation, no crackles or wheezes. Heart:  Normal rate and regular rhythm. S1 and S2 normal without gallop, murmur, click, rub or other extra sounds. Skin:  no rashes.     Impression & Recommendations:  Problem # 1:  ESSENTIAL HYPERTENSION, BENIGN (ICD-401.1) Elevated her today but home BPs have been well controlled at home even if add 10 ponits to her home BPS to adjust for the differences in machines. We will continue to monitor this.   Her updated medication list for this problem includes:    Lisinopril 20 Mg Tabs (Lisinopril) .Marland Kitchen... Take 1 tablet by mouth once a day    Calan Sr 240 Mg Cr-tabs (Verapamil hcl) .Marland Kitchen... Take 1 tablet by mouth once a day    Torsemide 20 Mg Tabs (Torsemide) .Marland Kitchen... Take one tablet by mouth once a day as needed    Toprol Xl 50 Mg Xr24h-tab  (Metoprolol succinate) .Marland Kitchen... Take 1 tablet by mouth once a day  Problem # 2:  PALPITATIONS (ICD-785.1) Had a holter monitor and showed PACs.  Had a stress test in 2009 that was normal. I recommend referral to cadiology to possible exclude structure abnormality like MVP with possible echo.  Her updated medication list for this problem includes:    Toprol Xl 50 Mg Xr24h-tab (Metoprolol succinate) .Marland Kitchen... Take 1 tablet by mouth once a day  Orders: Cardiology Referral (Cardiology)  Complete Medication List: 1)  Lisinopril 20 Mg Tabs (Lisinopril) .... Take 1 tablet by mouth once a day 2)  Calan Sr 240 Mg Cr-tabs (Verapamil hcl) .... Take 1 tablet by mouth once a day 3)  Torsemide 20 Mg Tabs (Torsemide) .... Take one tablet by mouth once a day as needed 4)  Zyrtec Allergy 10 Mg Tabs (Cetirizine hcl) .... Take one tablet by mouth once a day 5)  Fluticasone Propionate 50 Mcg/act Susp (Fluticasone propionate) .... 2 sprays in each nostril dialy 6)  Wellbutrin Xl 150 Mg Xr24h-tab (Bupropion hcl) .Marland Kitchen.. 1 tab by  mouth qam 7)  Toprol Xl 50 Mg Xr24h-tab (Metoprolol succinate) .... Take 1 tablet by mouth once a day 8)  Prilosec 20 Mg Cpdr (Omeprazole)  Patient Instructions: 1)  Recheck BP in 2 months.    Orders Added: 1)  Cardiology Referral [Cardiology] 2)  Est. Patient Level III [60454]    Current Allergies (reviewed today): No known allergies

## 2010-08-29 ENCOUNTER — Encounter: Payer: Self-pay | Admitting: Cardiology

## 2010-09-12 ENCOUNTER — Ambulatory Visit (INDEPENDENT_AMBULATORY_CARE_PROVIDER_SITE_OTHER): Payer: BC Managed Care – PPO | Admitting: Cardiology

## 2010-09-12 ENCOUNTER — Encounter: Payer: Self-pay | Admitting: Cardiology

## 2010-09-12 VITALS — BP 140/78 | HR 60 | Resp 18 | Ht 67.0 in | Wt 309.0 lb

## 2010-09-12 DIAGNOSIS — I10 Essential (primary) hypertension: Secondary | ICD-10-CM

## 2010-09-12 DIAGNOSIS — R002 Palpitations: Secondary | ICD-10-CM

## 2010-09-12 MED ORDER — METOPROLOL SUCCINATE ER 100 MG PO TB24: 100.0000 mg | ORAL_TABLET | Freq: Every day | ORAL | Status: DC

## 2010-09-12 NOTE — Patient Instructions (Signed)
Your physician has requested that you have an echocardiogram. Echocardiography is a painless test that uses sound waves to create images of your heart. It provides your doctor with information about the size and shape of your heart and how well your heart's chambers and valves are working. This procedure takes approximately one hour. There are no restrictions for this procedure. Your physician has recommended that you wear an event monitor. Event monitors are medical devices that record the heart's electrical activity. Doctors most often [place] these monitors to diagnose arrhythmias. Arrhythmias are problems with the speed or rhythm of the heartbeat. The monitor is a small, portable device. You can wear one while you do your normal daily activities. This is usually used to diagnose what is causing palpitations/syncope (passing out).  STOP VERAPAMILL INCREASE METOPROLOL SUCC TO 100MG  ONCE DAILY FOLLOW UP IN 6 WEEKS

## 2010-09-12 NOTE — Assessment & Plan Note (Signed)
Etiology unclear. Schedule for further evaluation. Schedule echocardiogram to quantify LV function. Discontinue verapamil. Increase toprol to 100 mg p.o. Daily.

## 2010-09-12 NOTE — Progress Notes (Signed)
HPI: 55 year old female for evaluation of palpitations. Patient had an exercise treadmill by Dr. Donnie Aho in March 2009 which was negative. She had a Holter monitor in Feb 2011 that showed sinus with pacs. Patient describes occasional palpitations. There occur predominantly in the morning. They are described as a brief flutter. She has some dyspnea on exertion but no orthopnea, PND, exertional chest pain or history of syncope. She occasionally has mild edema in her right lower extremity. Because of the above we will asked to further evaluate. Note recent potassium and TSH in February 2012 normal.  Current Outpatient Prescriptions  Medication Sig Dispense Refill  . buPROPion (WELLBUTRIN SR) 150 MG 12 hr tablet Take 150 mg by mouth daily.        . cetirizine (ZYRTEC) 10 MG tablet Take 10 mg by mouth daily.        Marland Kitchen lisinopril (PRINIVIL,ZESTRIL) 20 MG tablet Take 20 mg by mouth daily.        . metoprolol (TOPROL-XL) 50 MG 24 hr tablet Take 50 mg by mouth daily.        Marland Kitchen omeprazole (PRILOSEC) 20 MG capsule Take 20 mg by mouth daily.        Marland Kitchen torsemide (DEMADEX) 20 MG tablet Take 20 mg by mouth daily. prn       . DISCONTD: verapamil (CALAN-SR) 240 MG CR tablet Take 240 mg by mouth at bedtime.        Marland Kitchen DISCONTD: fluticasone (VERAMYST) 27.5 MCG/SPRAY nasal spray 2 sprays by Nasal route daily.          No Known Allergies  Past Medical History  Diagnosis Date  . Depression   . Anxiety   . Morbid obesity   . Hypertension   . Reflux     Past Surgical History  Procedure Date  . Ankle fracture surgery   . Wisdom tooth extraction   . Cyst removed     History   Social History  . Marital Status: Married    Spouse Name: Tim    Number of Children: 1  . Years of Education: N/A   Occupational History  . [employer]    Social History Main Topics  . Smoking status: Never Smoker   . Smokeless tobacco: Not on file  . Alcohol Use: Yes  . Drug Use: No  . Sexually Active: Not on file   Other Topics  Concern  . Not on file   Social History Narrative  . No narrative on file    Family History  Problem Relation Age of Onset  . Heart attack Father     at age 61  . Cancer Paternal Grandmother   . Coronary artery disease    . Arrhythmia Mother     pacemaker insertion  . Diabetes Maternal Aunt     ROS: no fevers or chills, productive cough, hemoptysis, dysphasia, odynophagia, melena, hematochezia, dysuria, hematuria, rash, seizure activity, orthopnea, PND, pedal edema, claudication. Remaining systems are negative.  Physical Exam: General:  Well developed/well nourished in NAD Skin warm/dry Patient not depressed No peripheral clubbing Back-normal HEENT-normal/normal eyelids Neck supple/normal carotid upstroke bilaterally; no bruits; no JVD; no thyromegaly chest - CTA/ normal expansion CV - RRR/normal S1 and S2; no murmurs, rubs or gallops;  PMI nondisplaced Abdomen -NT/ND, no HSM, no mass, + bowel sounds, no bruit 2+ femoral pulses, no bruits Ext-no edema, chords, 2+ DP Neuro-grossly nonfocal  ECG NSR with no ST changes.

## 2010-09-12 NOTE — Assessment & Plan Note (Signed)
Discontinue verapamil and increase Toprol. This should help with palpitations. Monitor blood pressure and adjust regimen as needed.

## 2010-09-13 ENCOUNTER — Telehealth: Payer: Self-pay | Admitting: Cardiology

## 2010-09-13 ENCOUNTER — Other Ambulatory Visit (HOSPITAL_COMMUNITY): Payer: Self-pay | Admitting: Cardiology

## 2010-09-13 DIAGNOSIS — R002 Palpitations: Secondary | ICD-10-CM

## 2010-09-13 NOTE — Telephone Encounter (Signed)
Pt. States her mouth was burning at the beginning after taken the medication Metoprolol. now It is better. Metoprolol succinate dose was increased to 100 mg  on last visit.  I review medication side effects with  Marilynn Rail PH-D to make sure. Kennon Rounds verify that burning sensation in mouth should not be this medication's side effects specially that pt. has been taken this medication before. Pt. Verbalized understanding.

## 2010-09-13 NOTE — Telephone Encounter (Signed)
Pt taking metoprolol 50mg , was told to take 100 mg after visit yesterday, took this am and mouth burning now, is this normal?

## 2010-09-14 ENCOUNTER — Encounter (INDEPENDENT_AMBULATORY_CARE_PROVIDER_SITE_OTHER): Payer: Federal, State, Local not specified - PPO

## 2010-09-14 ENCOUNTER — Ambulatory Visit (HOSPITAL_COMMUNITY): Payer: Federal, State, Local not specified - PPO | Attending: Cardiology | Admitting: Radiology

## 2010-09-14 DIAGNOSIS — R609 Edema, unspecified: Secondary | ICD-10-CM | POA: Insufficient documentation

## 2010-09-14 DIAGNOSIS — R002 Palpitations: Secondary | ICD-10-CM

## 2010-09-14 DIAGNOSIS — R0989 Other specified symptoms and signs involving the circulatory and respiratory systems: Secondary | ICD-10-CM

## 2010-09-14 DIAGNOSIS — R0609 Other forms of dyspnea: Secondary | ICD-10-CM | POA: Insufficient documentation

## 2010-09-17 ENCOUNTER — Telehealth: Payer: Self-pay | Admitting: *Deleted

## 2010-09-17 NOTE — Telephone Encounter (Signed)
Message copied by Deliah Goody on Mon Sep 17, 2010  4:48 PM ------      Message from: Olga Millers      Created: Fri Sep 14, 2010  7:19 PM       ok

## 2010-09-17 NOTE — Telephone Encounter (Signed)
pt aware of results  

## 2010-09-26 ENCOUNTER — Other Ambulatory Visit: Payer: Self-pay | Admitting: Family Medicine

## 2010-10-04 ENCOUNTER — Other Ambulatory Visit: Payer: Self-pay | Admitting: Family Medicine

## 2010-10-09 ENCOUNTER — Telehealth: Payer: Self-pay | Admitting: *Deleted

## 2010-10-09 NOTE — Telephone Encounter (Signed)
Spoke with pt, made her aware monitor reviewed by dr Jens Som shows sinus to sinus brady with occ PVC Rachael Hancock

## 2010-10-09 NOTE — Telephone Encounter (Signed)
Pt will be following up with Dr. Jens Som on 5/9.  Med list at last visit on 09/12/10 shows 1 tab daily.

## 2010-10-30 ENCOUNTER — Encounter: Payer: Self-pay | Admitting: *Deleted

## 2010-10-30 ENCOUNTER — Encounter: Payer: Self-pay | Admitting: Cardiology

## 2010-10-31 ENCOUNTER — Encounter: Payer: Self-pay | Admitting: Cardiology

## 2010-10-31 ENCOUNTER — Ambulatory Visit (INDEPENDENT_AMBULATORY_CARE_PROVIDER_SITE_OTHER): Payer: Federal, State, Local not specified - PPO | Admitting: Cardiology

## 2010-10-31 DIAGNOSIS — I10 Essential (primary) hypertension: Secondary | ICD-10-CM

## 2010-10-31 DIAGNOSIS — R002 Palpitations: Secondary | ICD-10-CM

## 2010-10-31 NOTE — Assessment & Plan Note (Signed)
Blood pressure controlled with present medications. Will continue. 

## 2010-10-31 NOTE — Progress Notes (Signed)
HPI: 55 year old female I saw in March 2012 for evaluation of palpitations. Patient had an exercise treadmill by Dr. Donnie Aho in March 2009 which was negative. She had a Holter monitor in Feb 2011 that showed sinus with pacs. Note potassium and TSH in February 2012 normal. Echocardiogram in March of 2012 showed normal LV function and mild biatrial enlargement. CardioNet showed sinus to sinus bradycardia with occasional PVC. Since I last saw her, she denies dyspnea, chest pain or syncope. Her palpitations have improved significantly. She rarely feels a "skip". There are no sustained palpitations.  Current Outpatient Prescriptions  Medication Sig Dispense Refill  . buPROPion (WELLBUTRIN SR) 150 MG 12 hr tablet Take 150 mg by mouth daily.        . cetirizine (ZYRTEC) 10 MG tablet Take 10 mg by mouth daily.        Marland Kitchen lisinopril (PRINIVIL,ZESTRIL) 20 MG tablet Take 1 tablet (20 mg total) by mouth daily.  30 tablet  3  . metoprolol (TOPROL-XL) 100 MG 24 hr tablet Take 1 tablet (100 mg total) by mouth daily.  30 tablet  12  . omeprazole (PRILOSEC) 20 MG capsule Take 20 mg by mouth daily.        Marland Kitchen torsemide (DEMADEX) 20 MG tablet Take 20 mg by mouth daily. prn         No Known Allergies  Past Medical History  Diagnosis Date  . Depression   . Anxiety   . Morbid obesity   . Hypertension   . Reflux     Past Surgical History  Procedure Date  . Ankle fracture surgery   . Wisdom tooth extraction   . Cyst removed     History   Social History  . Marital Status: Married    Spouse Name: Tim    Number of Children: 1  . Years of Education: 3 YRS COLL   Occupational History  . CUSTOMER SERVICE REP     [employer]   Social History Main Topics  . Smoking status: Never Smoker   . Smokeless tobacco: Not on file  . Alcohol Use: Yes  . Drug Use: No  . Sexually Active: Not on file   Other Topics Concern  . 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 Heart attack Father     at age 93  . Cancer Paternal Grandmother   . Coronary artery disease    . Arrhythmia Mother     pacemaker insertion  . Diabetes Maternal Aunt     ROS: no fevers or chills, productive cough, hemoptysis, dysphasia, odynophagia, melena, hematochezia, dysuria, hematuria, rash, seizure activity, orthopnea, PND, pedal edema, claudication. Remaining systems are negative.  Physical Exam: General:  Well developed/obese in NAD Skin warm/dry Patient not depressed No peripheral clubbing Back-normal HEENT-normal/normal eyelids Neck supple/normal carotid upstroke bilaterally; no bruits; no JVD; no thyromegaly chest - CTA/ normal expansion CV - RRR/normal S1 and S2; no murmurs, rubs or gallops;  PMI nondisplaced Abdomen -NT/ND, no HSM, no mass, + bowel sounds, no bruit 2+ femoral pulses, no bruits Ext-no edema, chords, 2+ DP Neuro-grossly nonfocal

## 2010-10-31 NOTE — Assessment & Plan Note (Signed)
Much improved with higher dose beta blocker. LV function normal. Cardiac showed rare PVC. No further workup at this time.

## 2010-11-28 ENCOUNTER — Other Ambulatory Visit: Payer: Self-pay | Admitting: Family Medicine

## 2010-12-28 ENCOUNTER — Telehealth: Payer: Self-pay | Admitting: Cardiology

## 2010-12-28 NOTE — Telephone Encounter (Signed)
Per pt calling, blood pressure medication was changed. B/p on today 147/6  Another time 145/72.

## 2010-12-28 NOTE — Telephone Encounter (Signed)
Continue present meds for now and follow BP. Rachael Hancock

## 2010-12-28 NOTE — Telephone Encounter (Signed)
Spoke with pt, she will cont to track her bp and let us know how it is running [employer]

## 2010-12-28 NOTE — Telephone Encounter (Signed)
Spoke with pt, she has noticed her bp running 126/70 to 147/62. When we last saw her we stopped her verapamil and increased her metoprolol. She wanted to know if she needed to change something. Will forward for dr Jens Som review Rachael Hancock

## 2011-03-04 ENCOUNTER — Other Ambulatory Visit: Payer: Self-pay | Admitting: Family Medicine

## 2011-03-07 ENCOUNTER — Telehealth: Payer: Self-pay | Admitting: Cardiology

## 2011-03-07 NOTE — Telephone Encounter (Signed)
Pt calling c/o high BP. Pt says last time she was in the office Dr. Jens Som changed pt medicine. Pt said the last few days pt BP has been running what she considers to be high.   Today 154/76 Another day this week is 146/43  Pt wants to know what it is advised that pt do. Please return call to discuss further.

## 2011-03-07 NOTE — Telephone Encounter (Signed)
Spoke with pt, her bp has ranged from 121/71 to 148/73. Her toprol was increased to 100 mg at last office visit and her verapamil was stopped. Pulse is usually in the 60's. Will forward for dr Jens Som review Rachael Hancock

## 2011-03-08 NOTE — Telephone Encounter (Signed)
Increase toprol to 125 mg po daily Rachael Hancock

## 2011-03-11 MED ORDER — METOPROLOL SUCCINATE ER 25 MG PO TB24: 25.0000 mg | ORAL_TABLET | Freq: Every day | ORAL | Status: DC

## 2011-03-11 NOTE — Telephone Encounter (Signed)
Pt rtn your call/lg °

## 2011-03-11 NOTE — Telephone Encounter (Signed)
Spoke with pt, aware to increase metoprolol. She will track her bp and pulse and let usd know of any problems Rachael Hancock

## 2011-03-25 ENCOUNTER — Telehealth: Payer: Self-pay | Admitting: Cardiology

## 2011-03-25 MED ORDER — LISINOPRIL 20 MG PO TABS: 20.0000 mg | ORAL_TABLET | Freq: Every day | ORAL | Status: DC

## 2011-03-25 NOTE — Telephone Encounter (Signed)
Pt needs refill on lisinopril 20mg  qd [facility] [address] [phone]

## 2011-03-29 ENCOUNTER — Telehealth: Payer: Self-pay | Admitting: Cardiology

## 2011-03-29 DIAGNOSIS — I10 Essential (primary) hypertension: Secondary | ICD-10-CM

## 2011-03-29 MED ORDER — LISINOPRIL 40 MG PO TABS: 40.0000 mg | ORAL_TABLET | Freq: Every day | ORAL | Status: DC

## 2011-03-29 NOTE — Telephone Encounter (Signed)
Increase lisinopril to 40 mg daily; bmet one week Rachael Hancock

## 2011-03-29 NOTE — Telephone Encounter (Signed)
Pt started Metoprolol and heart rate went really low into the 40's.  She has not taken any this week.  She felt really light headed while taking it.  What should she do next?  Please call her back.

## 2011-03-29 NOTE — Telephone Encounter (Signed)
Spoke with pt, aware of the change. Will mail order to pt for labs to be done in [place] [employer]

## 2011-03-29 NOTE — Telephone Encounter (Signed)
Spoke with pt, we recently increased her metprolol to 125 mg due to elevated bp. She reports her pulse lowering to 40's and get lightheaded. She has returned to the 100 mg dosage but her bp is up. Will forward for dr Jens Som review Rachael Hancock

## 2011-04-04 ENCOUNTER — Encounter: Payer: Self-pay | Admitting: Cardiology

## 2011-04-04 ENCOUNTER — Other Ambulatory Visit: Payer: Self-pay | Admitting: Cardiology

## 2011-04-05 LAB — BASIC METABOLIC PANEL WITH GFR
BUN: 19 mg/dL (ref 6–23)
Calcium: 9.2 mg/dL (ref 8.4–10.5)
GFR, Est African American: >60 mL/min (ref >60)
GFR, Est Non African American: >60 mL/min (ref >60)
Glucose, Bld: 80 mg/dL (ref 70–99)
Sodium: 140 mEq/L (ref 135–145)

## 2011-04-10 ENCOUNTER — Telehealth: Payer: Self-pay | Admitting: *Deleted

## 2011-04-10 NOTE — Telephone Encounter (Signed)
PT AWARE OF LAB RESULTS./CY 

## 2011-05-26 ENCOUNTER — Other Ambulatory Visit: Payer: Self-pay | Admitting: Family Medicine

## 2011-07-04 ENCOUNTER — Telehealth: Payer: Self-pay | Admitting: Cardiology

## 2011-07-04 NOTE — Telephone Encounter (Signed)
Spoke with pt, she is having palpitations similar to what she has had before. Mainly they are early in the am, her heart will race for few seconds then stop. It will at times reoccur. Her bp is running 133/74 to 129/72, her pulse is usually in the 50's. meds confirmed. Will forward for dr Jens Som review

## 2011-07-04 NOTE — Telephone Encounter (Signed)
Continue beta blocker and fu  Rachael Hancock

## 2011-07-04 NOTE — Telephone Encounter (Signed)
Spoke with pt, she will try taking the toprol in the evening to see if that helps with the palpitations. Follow up appt made.

## 2011-07-04 NOTE — Telephone Encounter (Signed)
New msg Pt called and she said Dr Jens Som changed her meds and heart is still racing. Please call

## 2011-07-10 ENCOUNTER — Ambulatory Visit: Payer: Federal, State, Local not specified - PPO | Admitting: Cardiology

## 2011-07-24 ENCOUNTER — Encounter: Payer: Self-pay | Admitting: Cardiology

## 2011-07-24 ENCOUNTER — Ambulatory Visit (INDEPENDENT_AMBULATORY_CARE_PROVIDER_SITE_OTHER): Payer: Federal, State, Local not specified - PPO | Admitting: Cardiology

## 2011-07-24 DIAGNOSIS — I10 Essential (primary) hypertension: Secondary | ICD-10-CM

## 2011-07-24 DIAGNOSIS — R002 Palpitations: Secondary | ICD-10-CM

## 2011-07-24 NOTE — Assessment & Plan Note (Signed)
We discussed the importance of weight loss. I've asked her to followup with her primary care physician as she may need a sleep study to rule out sleep apnea in the future. This could certainly be contributing to hypertension.

## 2011-07-24 NOTE — Assessment & Plan Note (Signed)
Patient checks her blood pressure routinely at home. She is concerned that he is occasionally 150. I reviewed her values today and in general her systolic blood pressure is 120-140 and her diastolic 80-85. We will continue with her present medications and she will follow her pressure at home. Increase medications as needed. Check potassium and renal function.

## 2011-07-24 NOTE — Assessment & Plan Note (Signed)
Reasonably well controlled. Continue beta blocker. 

## 2011-07-24 NOTE — Progress Notes (Signed)
   HPI: Pleasant female I saw in March 2012 for evaluation of palpitations. Patient had an exercise treadmill by Dr. Donnie Aho in March 2009 which was negative. She had a Holter monitor in Feb 2011 that showed sinus with pacs. Note potassium and TSH in February 2012 normal. Echocardiogram in March of 2012 showed normal LV function and mild biatrial enlargement. CardioNet showed sinus to sinus bradycardia with occasional PVC. Since I last saw her in May of 2012, she has occasional palpitations but seemed to be tolerable. She has dyspnea with more extreme activities but not routine activities. No orthopnea or PND but she does have chronic mild pedal edema controlled with diuretics. No chest pain or syncope.   Current Outpatient Prescriptions  Medication Sig Dispense Refill  . buPROPion (WELLBUTRIN XL) 150 MG 24 hr tablet TAKE 1 TABLET BY MOUTH EVERY MORNING  30 tablet  2  . cetirizine (ZYRTEC) 10 MG tablet Take 10 mg by mouth daily.        Marland Kitchen lisinopril (PRINIVIL,ZESTRIL) 40 MG tablet Take 1 tablet (40 mg total) by mouth daily.  30 tablet  12  . metoprolol (TOPROL-XL) 100 MG 24 hr tablet Take 1 tablet (100 mg total) by mouth daily.  30 tablet  12  . omeprazole (PRILOSEC) 20 MG capsule Take 20 mg by mouth daily.        Marland Kitchen torsemide (DEMADEX) 20 MG tablet Take 20 mg by mouth daily. prn          Past Medical History  Diagnosis Date  . Depression   . Anxiety   . Morbid obesity   . Hypertension   . Reflux   . Palpitations     Past Surgical History  Procedure Date  . Ankle fracture surgery   . Wisdom tooth extraction   . Cyst removed     History   Social History  . Marital Status: Married    Spouse Name: Tim    Number of Children: 1  . Years of Education: 3 YRS COLL   Occupational History  . CUSTOMER SERVICE REP     [employer]   Social History Main Topics  . Smoking status: Never Smoker   . Smokeless tobacco: Not on file  . Alcohol Use: Yes  . Drug Use: No  . Sexually  Active: Not on file   Other Topics Concern  . 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 Remaining systems are negative.  Physical Exam: Well-developed obese in no acute distress.  Skin is warm and dry.  HEENT is normal.  Neck is supple. No thyromegaly.  Chest is clear to auscultation with normal expansion.  Cardiovascular exam is regular rate and rhythm.  Abdominal exam nontender or distended. No masses palpated. Extremities show trace edema. neuro grossly intact  ECG normal sinus rhythm at a rate of 69. Axis normal. No ST changes.

## 2011-07-24 NOTE — Patient Instructions (Signed)
Your physician wants you to follow-up in: ONE YEAR  You will receive a reminder letter in the mail two months in advance. If you don't receive a letter, please call our office to schedule the follow-up appointment.   Your physician recommends that you return for lab work in: TODAY  

## 2011-07-25 LAB — BASIC METABOLIC PANEL WITH GFR
BUN: 16 mg/dL (ref 6–23)
Creat: 0.91 mg/dL (ref 0.50–1.10)
GFR, Est African American: 83 mL/min
GFR, Est Non African American: 72 mL/min

## 2011-08-13 ENCOUNTER — Encounter: Payer: Self-pay | Admitting: Family Medicine

## 2011-08-13 ENCOUNTER — Ambulatory Visit
Admission: RE | Admit: 2011-08-13 | Discharge: 2011-08-13 | Disposition: A | Discharge status: Home / Self Care | Payer: Federal, State, Local not specified - PPO | Source: Ambulatory Visit | Attending: Family Medicine | Admitting: Family Medicine

## 2011-08-13 ENCOUNTER — Other Ambulatory Visit: Payer: Self-pay | Admitting: Family Medicine

## 2011-08-13 ENCOUNTER — Ambulatory Visit (INDEPENDENT_AMBULATORY_CARE_PROVIDER_SITE_OTHER): Payer: Federal, State, Local not specified - PPO | Admitting: Family Medicine

## 2011-08-13 VITALS — BP 152/68 | HR 75 | Temp 98.1°F | Wt 325.0 lb

## 2011-08-13 DIAGNOSIS — R079 Chest pain, unspecified: Secondary | ICD-10-CM

## 2011-08-13 DIAGNOSIS — R0602 Shortness of breath: Secondary | ICD-10-CM

## 2011-08-13 DIAGNOSIS — I10 Essential (primary) hypertension: Secondary | ICD-10-CM

## 2011-08-13 DIAGNOSIS — R0683 Snoring: Secondary | ICD-10-CM

## 2011-08-13 MED ORDER — AMLODIPINE BESYLATE 5 MG PO TABS: 5.0000 mg | ORAL_TABLET | Freq: Every day | ORAL | Status: DC

## 2011-08-13 NOTE — Progress Notes (Signed)
  Subjective:    Patient ID: Rachael Hancock, female    DOB: [birth year], 56 y.o.   MRN: [id number]  HPI Left intermittant ear pain for about 2 weeks.  Also occ feels SOB. No palpitations.  Some occ CP but moving around.  More like hard to take a deep breath. No wheezing.  Left side of ribs hurts some.  That started about 4 days ago as well. nO DRAINAGE FROM THE EAR.    She is also very concerned about her BP.  Saw cardiology about 2 weeks ago.  Has been taking her BP and meds.  Says BP is higher in the AM.  She does snore.    Review of Systems     Objective:   Physical Exam  Constitutional: She is oriented to person, place, and time. She appears well-developed and well-nourished.  HENT:  Head: Normocephalic and atraumatic.  Right Ear: External ear normal.  Left Ear: External ear normal.  Nose: Nose normal.  Mouth/Throat: Oropharynx is clear and moist.       TMs and canals are clear.   Eyes: Conjunctivae and EOM are normal. Pupils are equal, round, and reactive to light.  Neck: Neck supple. No thyromegaly present.  Cardiovascular: Normal rate, regular rhythm and normal heart sounds.        No carotid bruits.   Pulmonary/Chest: Effort normal and breath sounds normal. She has no wheezes.  Musculoskeletal: She exhibits no edema.  Lymphadenopathy:    She has no cervical adenopathy.  Neurological: She is alert and oriented to person, place, and time.  Skin: Skin is warm and dry.  Psychiatric: She has a normal mood and affect. Her behavior is normal.          Assessment & Plan:  EAr Pain - Ear exam is normal. Gave reassurance. If her pain worsens or she starts having drainage from the ears and please let us know. I think this can be radiating from her neck. He massage and possibly a muscle relaxer could be helpful.  HTN - BP elevated today.  Move lisinopril to AM. Call me ion one week with BPs. Will consider adding amlodipine if not well controlled.Also consider testing for  sleep apnea. She is obse and does snore.  No regular exercise.     SOB - Will get CXR.  We will also check a d-dimer to rule out possible PE. Though, PE unlikely. If all of her tests are normal including a CBC and consider spirometry for further evaluation if the shortness of breath persists for another week or 2.Maybe secondary to her obesity. Also consider anxiety as a possible cause.  Atypical chest pain-please see comment above for shortness of breath. She also had an EKG at the cardiologist's office 2 weeks ago which was reassuring. Also her pain moves to different parts of her chest and is usually very brief period this is also reassuring that it is noncardiac.

## 2011-08-13 NOTE — Patient Instructions (Signed)
We will call you with your lab results. If you don't here from us in about a week then please give us a call at [phone].  

## 2011-08-14 LAB — CBC WITH DIFFERENTIAL/PLATELET
Basophils Absolute: 0 10*3/uL (ref 0.0–0.1)
Basophils Relative: 1 % (ref 0–1)
Eosinophils Absolute: 0.1 10*3/uL (ref 0.0–0.7)
Eosinophils Relative: 1 % (ref 0–5)
MCH: 30.1 pg (ref 26.0–34.0)
MCHC: 32.4 g/dL (ref 30.0–36.0)
MCV: 92.8 fL (ref 78.0–100.0)
Platelets: 264 10*3/uL (ref 150–400)
RDW: 13.3 % (ref 11.5–15.5)

## 2011-08-15 ENCOUNTER — Telehealth: Payer: Self-pay | Admitting: *Deleted

## 2011-08-15 NOTE — Telephone Encounter (Signed)
Went to [facility] last night and had chest CT and they could not find anything. Just wanted to let you know. BP is 157/75 this morning. Last night BP at hospital was 140/66.Started taking the new med amlodipine this morning.

## 2011-08-22 ENCOUNTER — Telehealth: Payer: Self-pay | Admitting: *Deleted

## 2011-08-22 NOTE — Telephone Encounter (Signed)
Try taking lisinopril in AM and amlodipinein PM and keep working on salt.

## 2011-08-22 NOTE — Telephone Encounter (Signed)
Pt states that she is concerned about her BP. States she is taking Lisinopril 40 mg and Amlodipine 5 mg and her BP are running anywhere from 118/70's to as high as 157/75. Pt states she is supposed to f/u in a month but wants to know if she needs to do anything differently. Pt states she is trying to cut back on sodium. Please advise.

## 2011-08-22 NOTE — Telephone Encounter (Signed)
Pt informed

## 2011-10-01 ENCOUNTER — Other Ambulatory Visit: Payer: Self-pay | Admitting: Cardiology

## 2011-10-01 DIAGNOSIS — I10 Essential (primary) hypertension: Secondary | ICD-10-CM

## 2011-10-01 MED ORDER — METOPROLOL SUCCINATE ER 100 MG PO TB24: 100.0000 mg | ORAL_TABLET | ORAL | Status: DC

## 2011-10-01 MED ORDER — LISINOPRIL 40 MG PO TABS: 40.0000 mg | ORAL_TABLET | Freq: Every day | ORAL | Status: DC

## 2011-10-01 NOTE — Telephone Encounter (Signed)
Pt needs refill on Lisinopril 40mg  and metoprolol 100mg  [facility] [place] she is out of meds and needs this called in asap

## 2011-10-09 ENCOUNTER — Ambulatory Visit: Payer: Federal, State, Local not specified - PPO | Admitting: Family Medicine

## 2011-11-06 ENCOUNTER — Telehealth: Payer: Self-pay | Admitting: Family Medicine

## 2011-11-06 NOTE — Telephone Encounter (Signed)
Dr. Sim Boast office will contact her.

## 2011-11-06 NOTE — Telephone Encounter (Signed)
Pt notified and ok to see sleep specialist

## 2011-11-06 NOTE — Telephone Encounter (Signed)
Call patient: I did receive the results of her sleep study. She does have mild obstructive sleep apnea with oxygen desaturation to 84%. She also has moderately severe snoring and some mild limb jerks. She had difficulty falling asleep and staying asleep. I do recommend starting her on CPAP for treatment of the apnea in addition to weight loss which will make a big difference. I would like her to to see Dr. Caren Hazy who is a sleep specialist that read her study to further discuss the results and potential treatment options.

## 2011-12-23 ENCOUNTER — Encounter: Payer: Self-pay | Admitting: Family Medicine

## 2011-12-23 DIAGNOSIS — G4733 Obstructive sleep apnea (adult) (pediatric): Secondary | ICD-10-CM | POA: Insufficient documentation

## 2012-03-12 ENCOUNTER — Other Ambulatory Visit: Payer: Self-pay | Admitting: Family Medicine

## 2012-03-12 NOTE — Telephone Encounter (Signed)
Pt needs f/u appt before future refills 

## 2012-03-13 ENCOUNTER — Encounter: Payer: Self-pay | Admitting: Family Medicine

## 2012-03-13 ENCOUNTER — Ambulatory Visit (INDEPENDENT_AMBULATORY_CARE_PROVIDER_SITE_OTHER): Payer: Federal, State, Local not specified - PPO | Admitting: Family Medicine

## 2012-03-13 VITALS — BP 148/71 | HR 61 | Wt 328.0 lb

## 2012-03-13 DIAGNOSIS — Z23 Encounter for immunization: Secondary | ICD-10-CM

## 2012-03-13 DIAGNOSIS — I10 Essential (primary) hypertension: Secondary | ICD-10-CM

## 2012-03-13 DIAGNOSIS — E669 Obesity, unspecified: Secondary | ICD-10-CM

## 2012-03-13 LAB — LIPID PANEL
Cholesterol: 206 mg/dL — ABNORMAL HIGH (ref 0–200)
Triglycerides: 106 mg/dL (ref <150)

## 2012-03-13 MED ORDER — HYDROCHLOROTHIAZIDE 12.5 MG PO TABS: 12.5000 mg | ORAL_TABLET | Freq: Every day | ORAL | Status: DC

## 2012-03-13 NOTE — Progress Notes (Signed)
  Subjective:    Patient ID: Rachael Hancock, female    DOB: [birth year], 56 y.o.   MRN: [id number]  HPI HTN- last few weeks BP running 120-140s.  No chest pain or shortness of breath. She's taking her medications regularly.  Obesity-she does have some concerns would like to discuss options for weight loss. She recently underwent hypnotism. She does feel it was helpful. She says she feels it has reduced her craving for carbohydrates.   Review of Systems     Objective:   Physical Exam  Constitutional: She is oriented to person, place, and time. She appears well-developed and well-nourished.  HENT:  Head: Normocephalic and atraumatic.  Cardiovascular: Normal rate, regular rhythm and normal heart sounds.   Pulmonary/Chest: Effort normal and breath sounds normal.  Neurological: She is alert and oriented to person, place, and time.  Skin: Skin is warm and dry.  Psychiatric: She has a normal mood and affect. Her behavior is normal.          Assessment & Plan:  HTN-  uncontrolled. She only uses her demedex about 4-5 times a year, not daily so will add hctz 12.5mg  daily.  Continue all the medications. Followup in one month. We discussed working on diet and exercise.  Obesity-she had hypnotism done recently to help her. She would like to see how that does in the next month. We also discussed other options such as seeing Dr. Seymour Bars for weight loss management or considering a medication in addition to diet and exercise. She says she has tried to make some good changes but she's not actively exercising right now.  Flu shot given today.

## 2012-03-14 LAB — COMPLETE METABOLIC PANEL WITH GFR
BUN: 14 mg/dL (ref 6–23)
CO2: 28 mEq/L (ref 19–32)
Calcium: 9.5 mg/dL (ref 8.4–10.5)
Chloride: 104 mEq/L (ref 96–112)
Creat: 0.87 mg/dL (ref 0.50–1.10)
GFR, Est African American: 87 mL/min
Total Bilirubin: 0.8 mg/dL (ref 0.3–1.2)

## 2012-04-08 ENCOUNTER — Other Ambulatory Visit: Payer: Self-pay | Admitting: Family Medicine

## 2012-04-16 ENCOUNTER — Ambulatory Visit: Payer: Federal, State, Local not specified - PPO | Admitting: Family Medicine

## 2012-04-21 ENCOUNTER — Encounter: Payer: Self-pay | Admitting: Family Medicine

## 2012-04-21 ENCOUNTER — Ambulatory Visit (INDEPENDENT_AMBULATORY_CARE_PROVIDER_SITE_OTHER): Payer: Federal, State, Local not specified - PPO | Admitting: Family Medicine

## 2012-04-21 VITALS — BP 119/64 | HR 68 | Ht 67.0 in | Wt 323.0 lb

## 2012-04-21 DIAGNOSIS — I10 Essential (primary) hypertension: Secondary | ICD-10-CM

## 2012-04-21 DIAGNOSIS — F39 Unspecified mood [affective] disorder: Secondary | ICD-10-CM

## 2012-04-21 MED ORDER — LISINOPRIL 40 MG PO TABS: 40.0000 mg | ORAL_TABLET | Freq: Every day | ORAL | Status: DC

## 2012-04-21 MED ORDER — AMLODIPINE BESYLATE 5 MG PO TABS: 5.0000 mg | ORAL_TABLET | Freq: Every day | ORAL | Status: DC

## 2012-04-21 NOTE — Progress Notes (Signed)
  Subjective:    Patient ID: Rachael Hancock, female    DOB: Apr 08, 1956, 56 y.o.   MRN: [id number]  HPI HTN- She was feeling rally dizzy so she cut her metoprolol in half and feels much better. Home BPs are runnin in the 120-130s/60-70.  No CP or SOB. Has lost 5 lbs.    Wants to know if needs her wellbutrin.  Her mood is well controlled.  Has been off for moods.    Review of Systems     Objective:   Physical Exam  Constitutional: She is oriented to person, place, and time. She appears well-developed and well-nourished.       + obese   HENT:  Head: Normocephalic and atraumatic.  Cardiovascular: Normal rate, regular rhythm and normal heart sounds.   Pulmonary/Chest: Effort normal and breath sounds normal.  Neurological: She is alert and oriented to person, place, and time.  Skin: Skin is warm and dry.  Psychiatric: She has a normal mood and affect. Her behavior is normal.          Assessment & Plan:  HTN - Well controlled. Has f/u with cardiololy in January. Needs refill for lisinopril. Otherwise followup in 6 months.  Mood disorder-I think at this point she's done well off the medication for several months. Will remove it from her medication list.  Morbid obesity-she's doing fantastic. She's lost 5 pounds and she was here a month ago. She has been getting on the treadmill and has noticed that her endurance has improved. This is fantastic. Keep current regimen.

## 2012-05-07 ENCOUNTER — Other Ambulatory Visit: Payer: Self-pay | Admitting: Family Medicine

## 2012-05-18 ENCOUNTER — Telehealth: Payer: Self-pay | Admitting: *Deleted

## 2012-05-18 DIAGNOSIS — K649 Unspecified hemorrhoids: Secondary | ICD-10-CM

## 2012-05-18 MED ORDER — HYDROCORTISONE 2.5 % RE CREA: TOPICAL_CREAM | Freq: Two times a day (BID) | RECTAL | Status: DC

## 2012-05-18 NOTE — Telephone Encounter (Signed)
Pt notified med sent to pharmacy

## 2012-05-18 NOTE — Telephone Encounter (Signed)
Pt calls and states that she is having problems with hemorroids. No pain or bleeding just a lot of itching. Has tried the OTC meds for the itching and nothing is helping that much- any suggestions. Going out of town this week for the holidays

## 2012-05-18 NOTE — Telephone Encounter (Signed)
Sent over rx cream.

## 2012-06-18 ENCOUNTER — Other Ambulatory Visit: Payer: Self-pay | Admitting: Family Medicine

## 2012-07-17 ENCOUNTER — Encounter: Payer: Self-pay | Admitting: Family Medicine

## 2012-07-17 ENCOUNTER — Ambulatory Visit (INDEPENDENT_AMBULATORY_CARE_PROVIDER_SITE_OTHER): Payer: Federal, State, Local not specified - PPO | Admitting: Family Medicine

## 2012-07-17 VITALS — BP 117/68 | HR 68 | Resp 18 | Wt 323.0 lb

## 2012-07-17 DIAGNOSIS — R002 Palpitations: Secondary | ICD-10-CM

## 2012-07-17 DIAGNOSIS — N912 Amenorrhea, unspecified: Secondary | ICD-10-CM

## 2012-07-17 LAB — COMPLETE METABOLIC PANEL WITH GFR
AST: 16 U/L (ref 0–37)
Albumin: 4.2 g/dL (ref 3.5–5.2)
BUN: 24 mg/dL — ABNORMAL HIGH (ref 6–23)
Calcium: 9.4 mg/dL (ref 8.4–10.5)
Chloride: 102 mEq/L (ref 96–112)
Creat: 0.97 mg/dL (ref 0.50–1.10)
GFR, Est Non African American: 66 mL/min
Glucose, Bld: 95 mg/dL (ref 70–99)

## 2012-07-17 LAB — ESTRADIOL: Estradiol: 31.8 pg/mL

## 2012-07-17 LAB — CBC
MCV: 88.3 fL (ref 78.0–100.0)
Platelets: 307 10*3/uL (ref 150–400)
RBC: 4.72 MIL/uL (ref 3.87–5.11)
RDW: 12.9 % (ref 11.5–15.5)
WBC: 6.1 10*3/uL (ref 4.0–10.5)

## 2012-07-17 LAB — PROGESTERONE: Progesterone: <0.2 ng/mL

## 2012-07-17 LAB — TSH: TSH: 4.293 u[IU]/mL (ref 0.350–4.500)

## 2012-07-17 LAB — FERRITIN: Ferritin: 76 ng/mL (ref 10–291)

## 2012-07-17 NOTE — Progress Notes (Signed)
Subjective:    Patient ID: Rachael Hancock, female    DOB: Jun 29, 1955, 57 y.o.   MRN: [id number]  HPI Says has been feeling her palpitations again.Having been well controlled up until this week. They are not painful but feels like it is coming "over her body".  Has been more emotional this week.  No cough meds or decongestant.  No blood loss.  No known anemia.  No changes in sleep.  Says does't feel like these are panic attacks.  She says she doesn't feel overly stressed or anxious right now. Nothing major going on in her life. She also had NovaSure procedure says she really doesn't have periods anymore. She's 56 and wonders if she could also be going through menopause. She has no GI symptoms with these episodes. They're very brief and only last a couple minutes at the most. The edges happening multiple times a day over the last week. She denies any new medication changes. She did try increasing her metoprolol one day and her blood pressure dropped Under 100 so went back to half of a tab.   Review of Systems No chest pain, no short of breath, no hot flashes or sweats.  BP 117/68  Pulse 68  Resp 18  Wt 323 lb (146.512 kg)  SpO2 98%    Allergies  Allergen Reactions  . Verapamil Other (See Comments)    Constipation.     Past Medical History  Diagnosis Date  . Depression   . Anxiety   . Morbid obesity   . Hypertension   . Reflux   . Palpitations     Past Surgical History  Procedure Date  . Ankle fracture surgery   . Wisdom tooth extraction   . Cyst removed     History   Social History  . Marital Status: Married    Spouse Name: Tim    Number of Children: 1  . Years of Education: 3 YRS COLL   Occupational History  . CUSTOMER SERVICE REP     [employer]   Social History Main Topics  . Smoking status: Never Smoker   . Smokeless tobacco: Not on file  . Alcohol Use: Yes  . Drug Use: No  . Sexually Active: Not on file   Other Topics Concern  . 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 Heart attack Father     at age 11  . Cancer Paternal Grandmother   . Coronary artery disease    . Arrhythmia Mother     pacemaker insertion  . Diabetes Maternal Aunt     Outpatient Encounter Prescriptions as of 07/17/2012  Medication Sig Dispense Refill  . amLODipine (NORVASC) 5 MG tablet Take 1 tablet (5 mg total) by mouth daily.  90 tablet  1  . cetirizine (ZYRTEC) 10 MG tablet Take 10 mg by mouth daily.        . hydrochlorothiazide (MICROZIDE) 12.5 MG capsule TAKE ONE CAPSULE BY MOUTH EVERY DAY  30 capsule  0  . hydrocortisone (ANUSOL-HC) 2.5 % rectal cream Place rectally 2 (two) times daily.  30 g  0  . lisinopril (PRINIVIL,ZESTRIL) 40 MG tablet Take 1 tablet (40 mg total) by mouth daily.  90 tablet  1  . metoprolol succinate (TOPROL-XL) 100 MG 24 hr tablet Take 50 mg by mouth 1 day or  1 dose. Take with or immediately following a meal.      . omeprazole (PRILOSEC) 20 MG capsule Take 20 mg by mouth daily.        Marland Kitchen torsemide (DEMADEX) 20 MG tablet Take 20 mg by mouth daily. prn               Objective:   Physical Exam  Constitutional: She is oriented to person, place, and time. She appears well-developed and well-nourished.  HENT:  Head: Normocephalic and atraumatic.  Right Ear: External ear normal.  Left Ear: External ear normal.  Nose: Nose normal.  Mouth/Throat: Oropharynx is clear and moist.       TMs and canals are clear.   Eyes: Conjunctivae normal and EOM are normal. Pupils are equal, round, and reactive to light.  Neck: Neck supple. No thyromegaly present.  Cardiovascular: Normal rate, regular rhythm and normal heart sounds.   Pulmonary/Chest: Effort normal and breath sounds normal. She has no wheezes.  Abdominal: Soft. Bowel sounds are normal. She exhibits no distension and no mass. There is no  tenderness. There is no rebound and no guarding.  Musculoskeletal: She exhibits no edema.  Lymphadenopathy:    She has no cervical adenopathy.  Neurological: She is alert and oriented to person, place, and time.  Skin: Skin is warm and dry.  Psychiatric: She has a normal mood and affect.          Assessment & Plan:  Palpitations-unclear etiology at this point. I'm not even sure that she's really having increased heart rate with these episodes. She felt the sensation come over her in the room we were talking and I checked her pulse and it was in the 60s. At this point hold off on any changes to her medication including her metoprolol. We will check her thyroid, check for anemia, check her iron stores, and also check hormone levels to see if she could be going into menopause. Also consider if all blood work is fairly normal but this could be related to her history of anxiety. She has no major triggers right now,  but certainly anxiety could still be causing problems. She was on Paxil for years and does want to go back on this but would consider something else if I felt that she needed it.  EKG rate of 62 beats per minute, normal sinus rhythm. Normal axis. No acute changes. There was one PVC on the EKG. It is possible she could be having episodes of PVCs that she is sensing as palpitations.

## 2012-07-20 ENCOUNTER — Telehealth: Payer: Self-pay | Admitting: *Deleted

## 2012-07-20 ENCOUNTER — Other Ambulatory Visit: Payer: Self-pay | Admitting: Family Medicine

## 2012-07-20 MED ORDER — PANTOPRAZOLE SODIUM 40 MG PO TBEC: 40.0000 mg | DELAYED_RELEASE_TABLET | Freq: Every day | ORAL | Status: DC

## 2012-07-21 ENCOUNTER — Encounter: Payer: Self-pay | Admitting: *Deleted

## 2012-07-21 ENCOUNTER — Other Ambulatory Visit: Payer: Self-pay | Admitting: Family Medicine

## 2012-07-21 NOTE — Telephone Encounter (Signed)
Error

## 2012-07-31 ENCOUNTER — Ambulatory Visit: Payer: Federal, State, Local not specified - PPO | Admitting: Family Medicine

## 2012-08-06 ENCOUNTER — Ambulatory Visit: Payer: Federal, State, Local not specified - PPO | Admitting: Family Medicine

## 2012-08-24 ENCOUNTER — Other Ambulatory Visit: Payer: Self-pay | Admitting: Family Medicine

## 2012-09-21 ENCOUNTER — Other Ambulatory Visit: Payer: Self-pay | Admitting: Family Medicine

## 2012-10-07 ENCOUNTER — Other Ambulatory Visit: Payer: Self-pay | Admitting: Family Medicine

## 2012-10-20 ENCOUNTER — Other Ambulatory Visit: Payer: Self-pay | Admitting: Family Medicine

## 2012-10-27 ENCOUNTER — Other Ambulatory Visit: Payer: Self-pay | Admitting: *Deleted

## 2012-10-27 MED ORDER — HYDROCHLOROTHIAZIDE 12.5 MG PO CAPS: ORAL_CAPSULE | ORAL | Status: DC

## 2012-10-28 ENCOUNTER — Other Ambulatory Visit: Payer: Self-pay | Admitting: *Deleted

## 2012-10-28 MED ORDER — METOPROLOL SUCCINATE ER 100 MG PO TB24: 100.0000 mg | ORAL_TABLET | Freq: Every day | ORAL | Status: DC

## 2013-01-13 ENCOUNTER — Other Ambulatory Visit: Payer: Self-pay

## 2013-01-13 ENCOUNTER — Other Ambulatory Visit: Payer: Self-pay | Admitting: Family Medicine

## 2013-01-13 MED ORDER — HYDROCHLOROTHIAZIDE 12.5 MG PO CAPS: ORAL_CAPSULE | ORAL | Status: DC

## 2013-01-13 NOTE — Telephone Encounter (Signed)
Needs refill on HCTZ ° °

## 2013-03-03 ENCOUNTER — Ambulatory Visit (INDEPENDENT_AMBULATORY_CARE_PROVIDER_SITE_OTHER): Payer: Federal, State, Local not specified - PPO | Admitting: Family Medicine

## 2013-03-03 ENCOUNTER — Encounter: Payer: Self-pay | Admitting: Family Medicine

## 2013-03-03 VITALS — BP 123/66 | HR 78 | Wt 329.0 lb

## 2013-03-03 DIAGNOSIS — K219 Gastro-esophageal reflux disease without esophagitis: Secondary | ICD-10-CM

## 2013-03-03 DIAGNOSIS — Z23 Encounter for immunization: Secondary | ICD-10-CM

## 2013-03-03 DIAGNOSIS — R635 Abnormal weight gain: Secondary | ICD-10-CM

## 2013-03-03 DIAGNOSIS — I10 Essential (primary) hypertension: Secondary | ICD-10-CM

## 2013-03-03 MED ORDER — METOPROLOL SUCCINATE ER 100 MG PO TB24: 100.0000 mg | ORAL_TABLET | Freq: Every day | ORAL | Status: DC

## 2013-03-03 MED ORDER — OLMESARTAN-AMLODIPINE-HCTZ 20-5-12.5 MG PO TABS: 1.0000 | ORAL_TABLET | Freq: Every day | ORAL | Status: DC

## 2013-03-03 MED ORDER — PHENTERMINE HCL 37.5 MG PO CAPS: 37.5000 mg | ORAL_CAPSULE | ORAL | Status: DC

## 2013-03-03 NOTE — Progress Notes (Signed)
Subjective:    Patient ID: Rachael Hancock, female    DOB: 1955/08/06, 57 y.o.   MRN: [id number]  HPI HTN -  Pt denies chest pain, SOB, dizziness, or heart palpitations.  Taking meds as directed w/o problems.  Denies medication side effects.   Has been walking on the treadmill everyday.    GERD - Still having some reflux lately. Taking protonix occassionally. Cut out her soda. Does stil have coffee in the AM but only one cup.    Abnormal weight gain-she's frustrated and at that point she really wants to do something additional to try to lose weight. She does a good job with her diet overall but if anything actually eats probably too few calories. She works out for 10 minutes a day on the treadmill. Even though she feels that she's doing the right things she's been slowly gaining weight. Review of Systems     BP 123/66  Pulse 78  Wt 329 lb (149.233 kg)  BMI 51.52 kg/m2    Allergies  Allergen Reactions  . Verapamil Other (See Comments)    Constipation.     Past Medical History  Diagnosis Date  . Depression   . Anxiety   . Morbid obesity   . Hypertension   . Reflux   . Palpitations     Past Surgical History  Procedure Laterality Date  . Ankle fracture surgery    . Wisdom tooth extraction    . Cyst removed      History   Social History  . Marital Status: Married    Spouse Name: Tim    Number of Children: 1  . Years of Education: 3 YRS COLL   Occupational History  . CUSTOMER SERVICE REP     [employer]   Social History Main Topics  . Smoking status: Never Smoker   . Smokeless tobacco: Not on file  . Alcohol Use: Yes  . Drug Use: No  . Sexual Activity: Not on file   Other Topics Concern  . 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 Heart attack Father     at age 57  . Cancer Paternal  Grandmother   . Coronary artery disease    . Arrhythmia Mother     pacemaker insertion  . Diabetes Maternal Aunt     Outpatient Encounter Prescriptions as of 03/03/2013  Medication Sig Dispense Refill  . hydrocortisone (ANUSOL-HC) 2.5 % rectal cream Place rectally 2 (two) times daily.  30 g  0  . loratadine (CLARITIN) 10 MG tablet Take 10 mg by mouth daily.      . metoprolol succinate (TOPROL-XL) 100 MG 24 hr tablet Take 1 tablet (100 mg total) by mouth daily. Take with or immediately following a meal.  30 tablet  3  . omeprazole (PRILOSEC) 40 MG capsule Take 40 mg by mouth daily.      . [DISCONTINUED] amLODipine (NORVASC) 5 MG tablet TAKE 1 TABLET (5 MG TOTAL) BY MOUTH DAILY.  90 tablet  1  . [DISCONTINUED] hydrochlorothiazide (MICROZIDE) 12.5 MG capsule TAKE ONE CAPSULE BY MOUTH EVERY DAY  90 capsule  0  . [DISCONTINUED] lisinopril (PRINIVIL,ZESTRIL) 40 MG tablet TAKE 1 TABLET (40 MG TOTAL) BY MOUTH DAILY.  90 tablet  1  . [DISCONTINUED] metoprolol succinate (TOPROL-XL)  100 MG 24 hr tablet Take 1 tablet (100 mg total) by mouth daily. Take with or immediately following a meal.  30 tablet  0  . Olmesartan-Amlodipine-HCTZ (TRIBENZOR) 20-5-12.5 MG TABS Take 1 tablet by mouth daily.  30 tablet  6  . phentermine 37.5 MG capsule Take 1 capsule (37.5 mg total) by mouth every morning.  30 capsule  0  . [DISCONTINUED] cetirizine (ZYRTEC) 10 MG tablet Take 10 mg by mouth daily.        . [DISCONTINUED] pantoprazole (PROTONIX) 40 MG tablet Take 1 tablet (40 mg total) by mouth daily.  30 tablet  2  . [DISCONTINUED] torsemide (DEMADEX) 20 MG tablet Take 20 mg by mouth daily. prn        No facility-administered encounter medications on file as of 03/03/2013.       Objective:   Physical Exam  Constitutional: She is oriented to person, place, and time. She appears well-developed and well-nourished.  HENT:  Head: Normocephalic and atraumatic.  Cardiovascular: Normal rate, regular rhythm and normal  heart sounds.   Pulmonary/Chest: Effort normal and breath sounds normal.  Neurological: She is alert and oriented to person, place, and time.  Skin: Skin is warm and dry.  Psychiatric: She has a normal mood and affect. Her behavior is normal.          Assessment & Plan:  HTN -  On CCB, diuretic, ACE, BBblocker.  Will change to Tribenzor  To make this more cost effective inconvenient. Samples given today and coupon card given. Should be $25 a month. She can take this in the morning and then take her metoprolol at bedtime. Followup in one month to recheck blood pressure I think she'll do well though.  Abnormal weight gain-we discussed different options. I recommend continuing with regular exercise but try to increase her duration of exercise at least a few days a week. Also encouraged her to start counting calories with the my fitness PAL application. We also discussed weight loss medications. We discussed the pros and cons of phentermine, Qsymia, and Belviq. Because of cost she would like to start the phentermine. She's not had any palms of cardiac months. She does have hypertension but it's very well controlled right now. We will monitor this very carefully and I'll see her back in one month. She needs to start the phentermine immediately if she has any chest pain or shortness of breath or palpitations on the medication.  Encouraged her to shoot for around 1400 calories per day.  GERD-overall improved. She may need to take her Prilosec consistently for a few weeks to get things back under control. Also recommend changing to low acid coffee.  Time spent 25 minutes, greater than 50% time spent counseling about weight, blood pressure and reflux.  She reports her mammogram is up-to-date and is scheduled for November.

## 2013-03-03 NOTE — Patient Instructions (Signed)
My Fitness Pal

## 2013-04-02 ENCOUNTER — Other Ambulatory Visit: Payer: Self-pay | Admitting: Family Medicine

## 2013-04-11 ENCOUNTER — Other Ambulatory Visit: Payer: Self-pay | Admitting: Family Medicine

## 2013-04-12 ENCOUNTER — Ambulatory Visit (INDEPENDENT_AMBULATORY_CARE_PROVIDER_SITE_OTHER): Payer: Federal, State, Local not specified - PPO | Admitting: Physician Assistant

## 2013-04-12 ENCOUNTER — Encounter: Payer: Self-pay | Admitting: Physician Assistant

## 2013-04-12 VITALS — BP 124/71 | HR 71 | Wt 324.0 lb

## 2013-04-12 DIAGNOSIS — J329 Chronic sinusitis, unspecified: Secondary | ICD-10-CM

## 2013-04-12 DIAGNOSIS — G43109 Migraine with aura, not intractable, without status migrainosus: Secondary | ICD-10-CM

## 2013-04-12 MED ORDER — AMOXICILLIN-POT CLAVULANATE 875-125 MG PO TABS: 1.0000 | ORAL_TABLET | Freq: Two times a day (BID) | ORAL | Status: DC

## 2013-04-12 MED ORDER — METHYLPREDNISOLONE SODIUM SUCC 125 MG IJ SOLR
125.0000 mg | Freq: Once | INTRAMUSCULAR | Status: AC
  Administered 2013-04-12: 125 mg via INTRAMUSCULAR

## 2013-04-12 MED ORDER — ONDANSETRON HCL 4 MG PO TABS: 4.0000 mg | ORAL_TABLET | Freq: Three times a day (TID) | ORAL | Status: DC | PRN

## 2013-04-12 NOTE — Patient Instructions (Addendum)
Aleve/excedrin migraine, stay hydrated.   Migraine Headache A migraine headache is an intense, throbbing pain on one or both sides of your head. A migraine can last for 30 minutes to several hours. CAUSES  The exact cause of a migraine headache is not always known. However, a migraine may be caused when nerves in the brain become irritated and release chemicals that cause inflammation. This causes pain. SYMPTOMS  Pain on one or both sides of your head.  Pulsating or throbbing pain.  Severe pain that prevents daily activities.  Pain that is aggravated by any physical activity.  Nausea, vomiting, or both.  Dizziness.  Pain with exposure to bright lights, loud noises, or activity.  General sensitivity to bright lights, loud noises, or smells. Before you get a migraine, you may get warning signs that a migraine is coming (aura). An aura may include:  Seeing flashing lights.  Seeing bright spots, halos, or zig-zag lines.  Having tunnel vision or blurred vision.  Having feelings of numbness or tingling.  Having trouble talking.  Having muscle weakness. MIGRAINE TRIGGERS  Alcohol.  Smoking.  Stress.  Menstruation.  Aged cheeses.  Foods or drinks that contain nitrates, glutamate, aspartame, or tyramine.  Lack of sleep.  Chocolate.  Caffeine.  Hunger.  Physical exertion.  Fatigue.  Medicines used to treat chest pain (nitroglycerine), birth control pills, estrogen, and some blood pressure medicines. DIAGNOSIS  A migraine headache is often diagnosed based on:  Symptoms.  Physical examination.  A CT scan or MRI of your head. TREATMENT Medicines may be given for pain and nausea. Medicines can also be given to help prevent recurrent migraines.  HOME CARE INSTRUCTIONS  Only take over-the-counter or prescription medicines for pain or discomfort as directed by your caregiver. The use of long-term narcotics is not recommended.  Lie down in a dark, quiet room  when you have a migraine.  Keep a journal to find out what may trigger your migraine headaches. For example, write down:  What you eat and drink.  How much sleep you get.  Any change to your diet or medicines.  Limit alcohol consumption.  Quit smoking if you smoke.  Get 7 to 9 hours of sleep, or as recommended by your caregiver.  Limit stress.  Keep lights dim if bright lights bother you and make your migraines worse. SEEK IMMEDIATE MEDICAL CARE IF:   Your migraine becomes severe.  You have a fever.  You have a stiff neck.  You have vision loss.  You have muscular weakness or loss of muscle control.  You start losing your balance or have trouble walking.  You feel faint or pass out.  You have severe symptoms that are different from your first symptoms. MAKE SURE YOU:   Understand these instructions.  Will watch your condition.  Will get help right away if you are not doing well or get worse. Document Released: 06/10/2005 Document Revised: 09/02/2011 Document Reviewed: 05/31/2011 [facility] Patient Information 2014 [place], [place].  Sinusitis Sinusitis is redness, soreness, and swelling (inflammation) of the paranasal sinuses. Paranasal sinuses are air pockets within the bones of your face (beneath the eyes, the middle of the forehead, or above the eyes). In healthy paranasal sinuses, mucus is able to drain out, and air is able to circulate through them by way of your nose. However, when your paranasal sinuses are inflamed, mucus and air can become trapped. This can allow bacteria and other germs to grow and cause infection. Sinusitis can develop quickly and  last only a short time (acute) or continue over a long period (chronic). Sinusitis that lasts for more than 12 weeks is considered chronic.  CAUSES  Causes of sinusitis include:  Allergies.  Structural abnormalities, such as displacement of the cartilage that separates your nostrils (deviated septum), which can  decrease the air flow through your nose and sinuses and affect sinus drainage.  Functional abnormalities, such as when the small hairs (cilia) that line your sinuses and help remove mucus do not work properly or are not present. SYMPTOMS  Symptoms of acute and chronic sinusitis are the same. The primary symptoms are pain and pressure around the affected sinuses. Other symptoms include:  Upper toothache.  Earache.  Headache.  Bad breath.  Decreased sense of smell and taste.  A cough, which worsens when you are lying flat.  Fatigue.  Fever.  Thick drainage from your nose, which often is green and may contain pus (purulent).  Swelling and warmth over the affected sinuses. DIAGNOSIS  Your caregiver will perform a physical exam. During the exam, your caregiver may:  Look in your nose for signs of abnormal growths in your nostrils (nasal polyps).  Tap over the affected sinus to check for signs of infection.  View the inside of your sinuses (endoscopy) with a special imaging device with a light attached (endoscope), which is inserted into your sinuses. If your caregiver suspects that you have chronic sinusitis, one or more of the following tests may be recommended:  Allergy tests.  Nasal culture A sample of mucus is taken from your nose and sent to a lab and screened for bacteria.  Nasal cytology A sample of mucus is taken from your nose and examined by your caregiver to determine if your sinusitis is related to an allergy. TREATMENT  Most cases of acute sinusitis are related to a viral infection and will resolve on their own within 10 days. Sometimes medicines are prescribed to help relieve symptoms (pain medicine, decongestants, nasal steroid sprays, or saline sprays).  However, for sinusitis related to a bacterial infection, your caregiver will prescribe antibiotic medicines. These are medicines that will help kill the bacteria causing the infection.  Rarely, sinusitis is  caused by a fungal infection. In theses cases, your caregiver will prescribe antifungal medicine. For some cases of chronic sinusitis, surgery is needed. Generally, these are cases in which sinusitis recurs more than 3 times per year, despite other treatments. HOME CARE INSTRUCTIONS   Drink plenty of water. Water helps thin the mucus so your sinuses can drain more easily.  Use a humidifier.  Inhale steam 3 to 4 times a day (for example, sit in the bathroom with the shower running).  Apply a warm, moist washcloth to your face 3 to 4 times a day, or as directed by your caregiver.  Use saline nasal sprays to help moisten and clean your sinuses.  Take over-the-counter or prescription medicines for pain, discomfort, or fever only as directed by your caregiver. SEEK IMMEDIATE MEDICAL CARE IF:  You have increasing pain or severe headaches.  You have nausea, vomiting, or drowsiness.  You have swelling around your face.  You have vision problems.  You have a stiff neck.  You have difficulty breathing. MAKE SURE YOU:   Understand these instructions.  Will watch your condition.  Will get help right away if you are not doing well or get worse. Document Released: 06/10/2005 Document Revised: 09/02/2011 Document Reviewed: 06/25/2011 [facility] Patient Information 2014 [place], [place].

## 2013-04-12 NOTE — Progress Notes (Signed)
  Subjective:    Patient ID: Rachael Hancock, female    DOB: 31-Mar-1956, 57 y.o.   MRN: [id number]  HPI Patient presents to clinic with migraine with aura. She has a history of migraines in childhood but they have been controlled with tylenol lately. She has had sinus pressure and facial pain for last week and half. She started having a migraine last Thursday, 4 days ago. Started with auras, took tylenol and went to bed. Woke up Friday morning and started with aura again. Took another tylenol and then full migraine started. HA is in sinuses to the backs of her eyes. HA went away and then came back Saturday morning. Took another tylenol and then somewhat resolved but had yet another migraine. Today her head fills sore but no migraine. Pain is behind eyes and sinuses. Denies any fever, chills, vomiting, ear pain SOB or wheezing. Other than tylenol she has not taken anything else.      Review of Systems     Objective:   Physical Exam  Constitutional: She is oriented to person, place, and time. She appears well-developed and well-nourished.  Morbid obesity.   HENT:  Head: Normocephalic and atraumatic.  Right Ear: External ear normal.  Left Ear: External ear normal.  Nose: Nose normal.  Mouth/Throat: Oropharynx is clear and moist. No oropharyngeal exudate.  TM's clear.  Bilateral maxillary and frontal tenderness to palpation.   Eyes: Conjunctivae are normal.  Clear watery discharge from both eyes.   Neck: Normal range of motion. Neck supple.  Cardiovascular: Normal rate, regular rhythm and normal heart sounds.   Pulmonary/Chest: Effort normal and breath sounds normal. She has no wheezes.  Lymphadenopathy:    She has no cervical adenopathy.  Neurological: She is alert and oriented to person, place, and time.  Skin: Skin is warm and dry.  Psychiatric: She has a normal mood and affect. Her behavior is normal.          Assessment & Plan:  Migraine with Aura/sinusitis- I think  sinusitis is triggering migraine. Treated with augmentin. Pt has nasocort OTC at home. Discussed using daily. Gave zofran for any future HA's. Pt has not been taking NSAIDs at onset of migraines and has no contraindications. Discussed trying aleve at onset of migraines. Gave solumedrol 125mg  in office today. Call if not improving or if migraines continuing.

## 2013-04-21 ENCOUNTER — Other Ambulatory Visit: Payer: Self-pay | Admitting: Family Medicine

## 2013-04-28 ENCOUNTER — Other Ambulatory Visit: Payer: Self-pay | Admitting: Physician Assistant

## 2013-04-28 ENCOUNTER — Telehealth: Payer: Self-pay | Admitting: *Deleted

## 2013-04-28 MED ORDER — PREDNISONE 50 MG PO TABS: ORAL_TABLET | ORAL | Status: DC

## 2013-04-28 NOTE — Telephone Encounter (Signed)
Pt states she has taken the abx but she is still having the aura w/a dull headache and would like to know what else she can do. Her BPs this am were 130/62 and later were 116/64. Please advise.  Meyer Cory, LPN

## 2013-04-28 NOTE — Telephone Encounter (Signed)
Ok I sent to pharmacy. If no improvement may need to consider preventative therapy for migraines.

## 2013-04-28 NOTE — Telephone Encounter (Signed)
She said that it would be fine for you to send the Prednisone. She trust you.  Meyer Cory, LPN

## 2013-04-28 NOTE — Telephone Encounter (Signed)
Lets try a short course of prednisone. Are you on board with that?

## 2013-05-11 ENCOUNTER — Ambulatory Visit (INDEPENDENT_AMBULATORY_CARE_PROVIDER_SITE_OTHER): Payer: Federal, State, Local not specified - PPO

## 2013-05-11 ENCOUNTER — Other Ambulatory Visit: Payer: Self-pay | Admitting: Family Medicine

## 2013-05-11 ENCOUNTER — Encounter: Payer: Self-pay | Admitting: Family Medicine

## 2013-05-11 ENCOUNTER — Ambulatory Visit (INDEPENDENT_AMBULATORY_CARE_PROVIDER_SITE_OTHER): Payer: Federal, State, Local not specified - PPO | Admitting: Family Medicine

## 2013-05-11 VITALS — BP 121/70 | HR 67 | Wt 261.0 lb

## 2013-05-11 DIAGNOSIS — I517 Cardiomegaly: Secondary | ICD-10-CM

## 2013-05-11 DIAGNOSIS — R0781 Pleurodynia: Secondary | ICD-10-CM

## 2013-05-11 DIAGNOSIS — R079 Chest pain, unspecified: Secondary | ICD-10-CM

## 2013-05-11 DIAGNOSIS — R1012 Left upper quadrant pain: Secondary | ICD-10-CM

## 2013-05-11 LAB — LIPASE: Lipase: 33 U/L (ref 0–75)

## 2013-05-11 LAB — TSH: TSH: 2.167 u[IU]/mL (ref 0.350–4.500)

## 2013-05-11 LAB — CBC WITH DIFFERENTIAL/PLATELET
Basophils Absolute: 0 10*3/uL (ref 0.0–0.1)
Basophils Relative: 1 % (ref 0–1)
Hemoglobin: 14.2 g/dL (ref 12.0–15.0)
Lymphocytes Relative: 28 % (ref 12–46)
MCHC: 34 g/dL (ref 30.0–36.0)
Neutro Abs: 3.8 10*3/uL (ref 1.7–7.7)
Neutrophils Relative %: 59 % (ref 43–77)
RDW: 13.4 % (ref 11.5–15.5)
WBC: 6.3 10*3/uL (ref 4.0–10.5)

## 2013-05-11 LAB — AMYLASE: Amylase: 44 U/L (ref 0–105)

## 2013-05-11 NOTE — Progress Notes (Signed)
Subjective:    Patient ID: Rachael Hancock, female    DOB: March 21, 1956, 57 y.o.   MRN: [id number]  HPI Pain under the edge of the left breast x 1 months. Has been happening more frequently. .  Says worse when bends over. Now also feels like pain in her left upper chest as well. Seems to be positional. Has been having a lot of problems with her neck.  Has been having frequent migraines. No relationship to eating. Does have hx of GERD.  It will hurt on and off all day. Says feels like something squeezing something.  Rates 6/10 at times.  Had mammo done on Saturday.  No nausea or diaphoresis.     Review of Systems BP 121/70  Pulse 67  Wt 261 lb (118.389 kg)    Allergies  Allergen Reactions  . Verapamil Other (See Comments)    Constipation.     Past Medical History  Diagnosis Date  . Depression   . Anxiety   . Morbid obesity   . Hypertension   . Reflux   . Palpitations     Past Surgical History  Procedure Laterality Date  . Ankle fracture surgery    . Wisdom tooth extraction    . Cyst removed      History   Social History  . Marital Status: Married    Spouse Name: Tim    Number of Children: 1  . Years of Education: 3 YRS COLL   Occupational History  . CUSTOMER SERVICE REP     [employer]   Social History Main Topics  . Smoking status: Never Smoker   . Smokeless tobacco: Not on file  . Alcohol Use: Yes  . Drug Use: No  . Sexual Activity: Not on file   Other Topics Concern  . 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 Heart attack Father     at age 61  . Cancer Paternal Grandmother   . Coronary artery disease    . Arrhythmia Mother     pacemaker insertion  . Diabetes Maternal Aunt     Outpatient Encounter Prescriptions as of 05/11/2013  Medication Sig  . amLODipine (NORVASC) 5 MG tablet TAKE  1 TABLET (5 MG TOTAL) BY MOUTH DAILY.  . hydrochlorothiazide (MICROZIDE) 12.5 MG capsule TAKE ONE CAPSULE BY MOUTH EVERY DAY  . hydrocortisone (ANUSOL-HC) 2.5 % rectal cream Place rectally 2 (two) times daily.  Marland Kitchen lisinopril (PRINIVIL,ZESTRIL) 40 MG tablet TAKE 1 TABLET (40 MG TOTAL) BY MOUTH DAILY.  Marland Kitchen loratadine (CLARITIN) 10 MG tablet Take 10 mg by mouth daily.  . Olmesartan-Amlodipine-HCTZ (TRIBENZOR) 20-5-12.5 MG TABS Take 1 tablet by mouth daily.  Marland Kitchen omeprazole (PRILOSEC) 40 MG capsule Take 40 mg by mouth daily.  . ondansetron (ZOFRAN) 4 MG tablet Take 1 tablet (4 mg total) by mouth every 8 (eight) hours as needed for nausea.  . phentermine 37.5 MG capsule Take 1 capsule (37.5 mg total) by mouth every morning.  . [DISCONTINUED] amoxicillin-clavulanate (AUGMENTIN) 875-125 MG per tablet Take 1 tablet by mouth 2 (two) times daily.  . [DISCONTINUED] metoprolol succinate (TOPROL-XL) 100 MG 24 hr tablet Take 1 tablet (100 mg total) by mouth daily. Take with or immediately following a meal.  . [DISCONTINUED] predniSONE (DELTASONE) 50 MG tablet Take one  tablet daily for 5 days.          Objective:   Physical Exam  Constitutional: She is oriented to person, place, and time. She appears well-developed and well-nourished.  HENT:  Head: Normocephalic and atraumatic.  Neck: Neck supple.  Cardiovascular: Normal rate, regular rhythm and normal heart sounds.   Pulmonary/Chest: Effort normal and breath sounds normal. She exhibits tenderness.  Tender over the left lower ribs under the left breast and mildly tender over the left upper chest near the sternum  Abdominal: Soft. Bowel sounds are normal. She exhibits no distension and no mass. There is no tenderness. There is no rebound and no guarding.  Neurological: She is alert and oriented to person, place, and time.  Skin: Skin is warm and dry.  Psychiatric: She has a normal mood and affect. Her behavior is normal.          Assessment & Plan:   Rib pain tenderness - Left lower anterior ribs below the left breast area. Will get xrays to ruleo out fracture since very tender.  EKG shows rate of 61 bpm, NSR, normal axis with 2 PVCs.  I really think her pain is more superficial over the ribs versus deep and the organs. I did go ahead and order blood work today to check for a pancreatic enzymes, troponin. She has been taking a little bit more caffeine than usual with her headaches so encouraged her to back off on this to make sure this isn't reflux related. If her x-rays are negative consider costochondritis. Would recommend an anti-inflammatory for the next one to 2 weeks to see if this improves her discomfort and pain.

## 2013-07-16 ENCOUNTER — Other Ambulatory Visit: Payer: Self-pay | Admitting: Family Medicine

## 2013-08-05 ENCOUNTER — Other Ambulatory Visit: Payer: Self-pay | Admitting: Family Medicine

## 2013-08-13 DIAGNOSIS — D126 Benign neoplasm of colon, unspecified: Secondary | ICD-10-CM | POA: Insufficient documentation

## 2013-08-13 DIAGNOSIS — I10 Essential (primary) hypertension: Secondary | ICD-10-CM | POA: Insufficient documentation

## 2013-08-20 ENCOUNTER — Encounter: Payer: Self-pay | Admitting: Physician Assistant

## 2013-08-20 ENCOUNTER — Ambulatory Visit (INDEPENDENT_AMBULATORY_CARE_PROVIDER_SITE_OTHER): Payer: Federal, State, Local not specified - PPO | Admitting: Physician Assistant

## 2013-08-20 VITALS — BP 130/59 | HR 73 | Temp 97.5°F | Ht 68.0 in | Wt 324.0 lb

## 2013-08-20 DIAGNOSIS — R1013 Epigastric pain: Secondary | ICD-10-CM

## 2013-08-20 DIAGNOSIS — K219 Gastro-esophageal reflux disease without esophagitis: Secondary | ICD-10-CM

## 2013-08-20 MED ORDER — SUCRALFATE 1 G PO TABS: 1.0000 g | ORAL_TABLET | Freq: Four times a day (QID) | ORAL | Status: DC

## 2013-08-20 NOTE — Patient Instructions (Addendum)
Stop omeprazole.  Start prontonix.  StOP Aleve carafate as needed.    Hiatal Hernia A hiatal hernia occurs when a part of the stomach slides above the diaphragm. The diaphragm is the thin muscle separating the belly (abdomen) from the chest. A hiatal hernia can be something you are born with or develop over time. Hiatal hernias may allow stomach acid to flow back into your esophagus, the tube which carries food from your mouth to your stomach. If this acid causes problems it is called GERD (gastro-esophageal reflux disease).  SYMPTOMS  Common symptoms of GERD are heartburn (burning in your chest). This is worse when lying down or bending over. It may also cause belching and indigestion. Some of the things which make GERD worse are:  Increased weight pushes on stomach making acid rise more easily.  Smoking markedly increases acid production.  Alcohol decreases lower esophageal sphincter pressure (valve between stomach and esophagus), allowing acid from stomach into esophagus.  Late evening meals and going to bed with a full stomach increases pressure.  Anything that causes an increase in acid production.  Lower esophageal sphincter incompetence. DIAGNOSIS  Hiatal hernia is often diagnosed with x-rays of your stomach and small bowel. This is called an UGI (upper gastrointestinal x-ray). Sometimes a gastroscopic procedure is done. This is a procedure where your caregiver uses a flexible instrument to look into the stomach and small bowel. HOME CARE INSTRUCTIONS   Try to achieve and maintain an ideal body weight.  Avoid drinking alcoholic beverages.  Stop smoking.  Put the head of your bed on 4 to 6 inch blocks. This will keep your head and esophagus higher than your stomach. If you cannot use blocks, sleep with several pillows under your head and shoulders.  Over-the-counter medications will decrease acid production. Your caregiver can also prescribe medications for this. Take as  directed.  1/2 to 1 teaspoon of an antacid taken every hour while awake, with meals and at bedtime, will neutralize acid.  Do not take aspirin, ibuprofen (Advil or Motrin), or other nonsteroidal anti-inflammatory drugs.  Do not wear tight clothing around your chest or stomach.  Eat smaller meals and eat more frequently. This keeps your stomach from getting too full. Eat slowly.  Do not lie down for 2 or 3 hours after eating. Do not eat or drink anything 1 to 2 hours before going to bed.  Avoid caffeine beverages (colas, coffee, cocoa, tea), fatty foods, citrus fruits and all other foods and drinks that contain acid and that seem to increase the problems.  Avoid bending over, especially after eating. Also avoid straining during bowel movements or when urinating or lifting things. Anything that increases the pressure in your belly increases the amount of acid that may be pushed up into your esophagus. SEEK IMMEDIATE MEDICAL CARE IF:  There is change in location (pain in arms, neck, jaw, teeth or back) of your pain, or the pain is getting worse.  You also experience nausea, vomiting, sweating (diaphoresis), or shortness of breath.  You develop continual vomiting, vomit blood or coffee ground material, have bright red blood in your stools, or have black tarry stools. Some of these symptoms could signal other problems such as heart disease. MAKE SURE YOU:   Understand these instructions.  Monitor your condition.  Contact your caregiver if you are not doing well or are getting worse. Document Released: 08/31/2003 Document Revised: 09/02/2011 Document Reviewed: 06/10/2005 [facility] Patient Information 2014 [place], [place].

## 2013-08-20 NOTE — Progress Notes (Signed)
   Subjective:    Patient ID: Rachael Hancock, female    DOB: 07-Oct-1955, 58 y.o.   MRN: [id number]  HPI  Pt presents to the clinic with epigastic pain. She has had on and off again epigastric pain for last month or so but last night seemed to radiate into right shoulder. She admits to having shoulder pain occasionally but this feels different. She denies any fever, nausea, vomiting. Her pain seems to be worse at night. She has tried tums and does seem to relieve symptoms. She takes omeprazole daily but was given protonix and never started. She has been taking more aleve for muscle aches. Denies any jaw pain, CP, palpiations, or SOB.      Review of Systems     Objective:   Physical Exam  Constitutional: She is oriented to person, place, and time. She appears well-developed and well-nourished.  Obese.  HENT:  Head: Normocephalic and atraumatic.  Cardiovascular: Normal rate, regular rhythm and normal heart sounds.   Pulmonary/Chest: Effort normal and breath sounds normal. She has no wheezes.  Abdominal: Soft. Bowel sounds are normal. She exhibits no distension and no mass. There is tenderness. There is no rebound and no guarding.  Tenderness over epigastric area. Negative murphys sign or RUQ tenderness.   Neurological: She is alert and oriented to person, place, and time.  Psychiatric: She has a normal mood and affect. Her behavior is normal.          Assessment & Plan:  Epigastric pain/GERD- Stop omeprazole. Start prontonix that pt already has. StOP Aleve. carafate as needed. If not improving in 5-7 days can consider getting Upper GI series to rule out hiatal hernia. Not tender over RUQ. Gallbladder disease is less likely.

## 2013-09-09 ENCOUNTER — Ambulatory Visit (INDEPENDENT_AMBULATORY_CARE_PROVIDER_SITE_OTHER): Payer: Federal, State, Local not specified - PPO | Admitting: Family Medicine

## 2013-09-09 ENCOUNTER — Encounter: Payer: Self-pay | Admitting: Family Medicine

## 2013-09-09 VITALS — BP 112/58 | HR 71 | Temp 97.7°F | Wt 323.0 lb

## 2013-09-09 DIAGNOSIS — J069 Acute upper respiratory infection, unspecified: Secondary | ICD-10-CM

## 2013-09-09 DIAGNOSIS — I10 Essential (primary) hypertension: Secondary | ICD-10-CM

## 2013-09-09 DIAGNOSIS — R1013 Epigastric pain: Secondary | ICD-10-CM

## 2013-09-09 MED ORDER — LISINOPRIL 20 MG PO TABS: 20.0000 mg | ORAL_TABLET | Freq: Every day | ORAL | Status: DC

## 2013-09-09 NOTE — Progress Notes (Signed)
Subjective:    Patient ID: Rachael Hancock, female    DOB: 1956-02-25, 58 y.o.   MRN: [id number]  HPI Eigastric pain for > 1 month. Comes and goes.  Worse after eats a meal.  Saw Jade a month ago and put on protonix. No nausea. HAs been belching.  Still occ getting LUQ pain. She says it's not painful to the point of doubling her over but sometimes just feels like she has to move her bra to get more comfortable. It definitely seems to be related to eating. She denies any actual vomiting or nausea with it. No recent change in bowels.  Also complains of some sinus pressure for the last 2-3 days. She says it feels better to put warm compresses on it. Voice has been really hoarse during the same timeframe. No fevers chills or sweats. She has had a little bit of nasal drip that has been clear. No sore throat..     Review of Systems  BP 112/58  Pulse 71  Temp(Src) 97.7 F (36.5 C)  Wt 323 lb (146.512 kg)  SpO2 98%    Allergies  Allergen Reactions  . Verapamil Other (See Comments)    Constipation.     Past Medical History  Diagnosis Date  . Depression   . Anxiety   . Morbid obesity   . Hypertension   . Reflux   . Palpitations     Past Surgical History  Procedure Laterality Date  . Ankle fracture surgery    . Wisdom tooth extraction    . Cyst removed      History   Social History  . Marital Status: Married    Spouse Name: Tim    Number of Children: 1  . Years of Education: 3 YRS COLL   Occupational History  . CUSTOMER SERVICE REP     [employer]   Social History Main Topics  . Smoking status: Never Smoker   . Smokeless tobacco: Not on file  . Alcohol Use: Yes  . Drug Use: No  . Sexual Activity: Not on file   Other Topics Concern  . 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 Heart attack Father      at age 65  . Cancer Paternal Grandmother   . Coronary artery disease    . Arrhythmia Mother     pacemaker insertion  . Diabetes Maternal Aunt     Outpatient Encounter Prescriptions as of 09/09/2013  Medication Sig  . amLODipine (NORVASC) 5 MG tablet TAKE 1 TABLET (5 MG TOTAL) BY MOUTH DAILY.  . hydrochlorothiazide (MICROZIDE) 12.5 MG capsule TAKE ONE CAPSULE BY MOUTH EVERY DAY  . lisinopril (PRINIVIL,ZESTRIL) 20 MG tablet Take 1 tablet (20 mg total) by mouth daily.  Marland Kitchen loratadine (CLARITIN) 10 MG tablet Take 10 mg by mouth daily.  . metoprolol succinate (TOPROL-XL) 100 MG 24 hr tablet TAKE 1/2 TABLET (100 MG TOTAL) BY MOUTH DAILY. TAKE WITH OR IMMEDIATELY FOLLOWING A MEAL.  . pantoprazole (PROTONIX) 40 MG tablet Take 40 mg by mouth daily.  . sucralfate (CARAFATE) 1 G tablet Take 1 tablet (1 g total) by mouth 4 (four) times daily.  . [DISCONTINUED] lisinopril (PRINIVIL,ZESTRIL) 40 MG tablet TAKE 1 TABLET (40 MG TOTAL) BY MOUTH DAILY.  . [DISCONTINUED] metoprolol succinate (  TOPROL-XL) 100 MG 24 hr tablet TAKE 1 TABLET (100 MG TOTAL) BY MOUTH DAILY. TAKE WITH OR IMMEDIATELY FOLLOWING A MEAL.  Marland Kitchen nystatin-triamcinolone ointment (MYCOLOG)   . [DISCONTINUED] hydrocortisone (ANUSOL-HC) 2.5 % rectal cream Place rectally 2 (two) times daily.  . [DISCONTINUED] Olmesartan-Amlodipine-HCTZ (TRIBENZOR) 20-5-12.5 MG TABS Take 1 tablet by mouth daily.  . [DISCONTINUED] ondansetron (ZOFRAN) 4 MG tablet Take 1 tablet (4 mg total) by mouth every 8 (eight) hours as needed for nausea.  . [DISCONTINUED] phentermine 37.5 MG capsule Take 1 capsule (37.5 mg total) by mouth every morning.          Objective:   Physical Exam  Constitutional: She is oriented to person, place, and time. She appears well-developed and well-nourished.  HENT:  Head: Normocephalic and atraumatic.  Right Ear: External ear normal.  Left Ear: External ear normal.  Nose: Nose normal.  Mouth/Throat: Oropharynx is clear and moist.   TMs and canals are clear.   Eyes: Conjunctivae and EOM are normal. Pupils are equal, round, and reactive to light.  Neck: Neck supple. No thyromegaly present.  Cardiovascular: Normal rate, regular rhythm and normal heart sounds.   Pulmonary/Chest: Effort normal and breath sounds normal. She has no wheezes.  Abdominal: Soft. Bowel sounds are normal. She exhibits no distension and no mass. There is tenderness. There is no rebound and no guarding.  She does have some mild epigastric tenderness and also little bit of tenderness over the lower end of the sternum.  Lymphadenopathy:    She has no cervical adenopathy.  Neurological: She is alert and oriented to person, place, and time.  Skin: Skin is warm and dry.  Psychiatric: She has a normal mood and affect.          Assessment & Plan:  Epigatric pain - REfer to GI for futher evaluation.  Increase protonix to BID for now. I really think we need to get her in to gastroneurology for further evaluation since she still having some breakthrough symptoms on her PPI. She has noticed a little bit of improvement on Protonix compared the omeprazole but still having significant breakthrough symptoms. I really think endoscopy is warranted at this time for further evaluation to look for things like Barrett's or gastritis. Also consider hiatal hernia. Also consider gallbladder there her symptoms are not classic. Will schedule for right upper quadrant ultrasound for further evaluation. She requests to see digestive health specialists who she saw for her recent colonoscopy which is up-to-date.  HTN- BP acutally running low. Will dec lisinopril to 20mg  and f/U in 3 months. I think this looks fantastic overall.  URI vs AR- she's not better in a week or he gets worse or she spikes a fever then please call us back and we'll treat her for an acute sinus infection. Also consider this could be somewhat allergic. On season has started. She can certainly try an  over-the-counter antihistamine if she would like. To me with warm compresses if that helps the sinus pain and pressure.

## 2013-09-10 ENCOUNTER — Ambulatory Visit (INDEPENDENT_AMBULATORY_CARE_PROVIDER_SITE_OTHER): Payer: Federal, State, Local not specified - PPO

## 2013-09-10 DIAGNOSIS — R1013 Epigastric pain: Secondary | ICD-10-CM

## 2013-09-10 DIAGNOSIS — I77811 Abdominal aortic ectasia: Secondary | ICD-10-CM

## 2013-09-14 ENCOUNTER — Other Ambulatory Visit: Payer: Self-pay | Admitting: Family Medicine

## 2013-09-14 ENCOUNTER — Other Ambulatory Visit: Payer: Self-pay | Admitting: Physician Assistant

## 2013-09-20 ENCOUNTER — Telehealth: Payer: Self-pay | Admitting: *Deleted

## 2013-09-20 NOTE — Telephone Encounter (Signed)
Pt called and wanted to know if she should keep appt w/GI I told her that it would be in her best interest to keep this appt. She voiced understanding and agreed.Rachael Hancock [place]

## 2013-10-21 ENCOUNTER — Other Ambulatory Visit: Payer: Self-pay | Admitting: Family Medicine

## 2013-11-10 ENCOUNTER — Other Ambulatory Visit: Payer: Self-pay | Admitting: Family Medicine

## 2013-12-08 ENCOUNTER — Other Ambulatory Visit: Payer: Self-pay | Admitting: Family Medicine

## 2014-01-05 ENCOUNTER — Other Ambulatory Visit: Payer: Self-pay | Admitting: Family Medicine

## 2014-01-10 ENCOUNTER — Telehealth: Payer: Self-pay

## 2014-01-10 NOTE — Telephone Encounter (Signed)
Pantoprazole prior Rachael Hancock has been approved until 01/11/2015.

## 2014-01-24 ENCOUNTER — Other Ambulatory Visit: Payer: Self-pay | Admitting: Family Medicine

## 2014-03-06 ENCOUNTER — Other Ambulatory Visit: Payer: Self-pay | Admitting: Family Medicine

## 2014-03-08 ENCOUNTER — Other Ambulatory Visit: Payer: Self-pay | Admitting: Family Medicine

## 2014-03-25 ENCOUNTER — Encounter: Payer: Self-pay | Admitting: Family Medicine

## 2014-03-25 ENCOUNTER — Ambulatory Visit (INDEPENDENT_AMBULATORY_CARE_PROVIDER_SITE_OTHER): Payer: Federal, State, Local not specified - PPO | Admitting: Family Medicine

## 2014-03-25 VITALS — BP 120/66 | HR 73 | Temp 97.7°F | Ht 68.0 in | Wt 328.0 lb

## 2014-03-25 DIAGNOSIS — R1011 Right upper quadrant pain: Secondary | ICD-10-CM

## 2014-03-25 DIAGNOSIS — Z13220 Encounter for screening for lipoid disorders: Secondary | ICD-10-CM | POA: Diagnosis not present

## 2014-03-25 DIAGNOSIS — Z23 Encounter for immunization: Secondary | ICD-10-CM | POA: Diagnosis not present

## 2014-03-25 DIAGNOSIS — I10 Essential (primary) hypertension: Secondary | ICD-10-CM

## 2014-03-25 DIAGNOSIS — R1013 Epigastric pain: Secondary | ICD-10-CM

## 2014-03-25 MED ORDER — AMLODIPINE BESYLATE 5 MG PO TABS: 5.0000 mg | ORAL_TABLET | Freq: Every day | ORAL | Status: DC

## 2014-03-25 MED ORDER — HYDROCHLOROTHIAZIDE 12.5 MG PO CAPS: ORAL_CAPSULE | ORAL | Status: DC

## 2014-03-25 MED ORDER — LISINOPRIL 20 MG PO TABS: ORAL_TABLET | ORAL | Status: DC

## 2014-03-25 MED ORDER — PANTOPRAZOLE SODIUM 40 MG PO TBEC: DELAYED_RELEASE_TABLET | ORAL | Status: DC

## 2014-03-25 MED ORDER — SUCRALFATE 1 G PO TABS: 1.0000 g | ORAL_TABLET | Freq: Three times a day (TID) | ORAL | Status: DC

## 2014-03-25 NOTE — Assessment & Plan Note (Signed)
Well-controlled. Continue current regimen. Refill sent to pharmacy. Followup in 6 months

## 2014-03-25 NOTE — Progress Notes (Signed)
Subjective:    Patient ID: Rachael Hancock, female    DOB: April 13, 1956, 58 y.o.   MRN: [id number]  HPI Had EGD in June and dx with gastritis and gastric polyps.  Was on Protonix. When ran out she switched to OTC prilosec.  Had an episode last week where she had a sudden pain in the RUQ and LUQ.  Then spread to her body and lasted several minutes. No CP, sob, sweats or palpitations.  Occ has hot flashes. Says her pain is throbbing.  No change in BM. Denies constipation  Hypertension- Pt denies chest pain, SOB, dizziness, or heart palpitations.  Taking meds as directed w/o problems.  Denies medication side effects.     Review of Systems     BP 120/66  Pulse 73  Temp(Src) 97.7 F (36.5 C)  Ht 5\' 8"  (1.727 m)  Wt 328 lb (148.78 kg)  BMI 49.88 kg/m2    Allergies  Allergen Reactions  . Verapamil Other (See Comments)    Constipation.     Past Medical History  Diagnosis Date  . Depression   . Anxiety   . Morbid obesity   . Hypertension   . Reflux   . Palpitations     Past Surgical History  Procedure Laterality Date  . Ankle fracture surgery    . Wisdom tooth extraction    . Cyst removed      History   Social History  . Marital Status: Married    Spouse Name: Tim    Number of Children: 1  . Years of Education: 3 YRS COLL   Occupational History  . CUSTOMER SERVICE REP     [employer]   Social History Main Topics  . Smoking status: Never Smoker   . Smokeless tobacco: Not on file  . Alcohol Use: Yes  . Drug Use: No  . Sexual Activity: Not on file   Other Topics Concern  . 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 Heart attack Father     at age 9  . Cancer Paternal Grandmother   . Coronary artery disease    . Arrhythmia Mother     pacemaker insertion  . Diabetes Maternal Aunt      Outpatient Encounter Prescriptions as of 03/25/2014  Medication Sig  . amLODipine (NORVASC) 5 MG tablet Take 1 tablet (5 mg total) by mouth daily.  . hydrochlorothiazide (MICROZIDE) 12.5 MG capsule TAKE ONE CAPSULE BY MOUTH EVERY DAY  . lisinopril (PRINIVIL,ZESTRIL) 20 MG tablet TAKE 1 TABLET (20 MG TOTAL) BY MOUTH DAILY.  Marland Kitchen loratadine (CLARITIN) 10 MG tablet Take 10 mg by mouth daily.  . metoprolol succinate (TOPROL-XL) 100 MG 24 hr tablet TAKE 1 TABLET (100 MG TOTAL) BY MOUTH DAILY. TAKE WITH OR IMMEDIATELY FOLLOWING A MEAL.  . pantoprazole (PROTONIX) 40 MG tablet TAKE 1 TABLET (40 MG TOTAL) BY MOUTH DAILY.  Marland Kitchen sucralfate (CARAFATE) 1 G tablet Take 1 tablet (1 g total) by mouth 4 (four) times daily -  with meals and at bedtime.  . [DISCONTINUED] amLODipine (NORVASC) 5 MG tablet TAKE 1 TABLET (5 MG TOTAL) BY MOUTH DAILY.  . [DISCONTINUED] hydrochlorothiazide (MICROZIDE) 12.5 MG capsule TAKE ONE CAPSULE BY MOUTH EVERY DAY  . [DISCONTINUED] lisinopril (PRINIVIL,ZESTRIL) 20 MG tablet TAKE 1 TABLET (20  MG TOTAL) BY MOUTH DAILY.  . [DISCONTINUED] metoprolol succinate (TOPROL-XL) 100 MG 24 hr tablet TAKE 1/2 TABLET (100 MG TOTAL) BY MOUTH DAILY. TAKE WITH OR IMMEDIATELY FOLLOWING A MEAL.  . [DISCONTINUED] nystatin-triamcinolone ointment (MYCOLOG)   . [DISCONTINUED] pantoprazole (PROTONIX) 40 MG tablet Take 40 mg by mouth daily.  . [DISCONTINUED] pantoprazole (PROTONIX) 40 MG tablet TAKE 1 TABLET (40 MG TOTAL) BY MOUTH DAILY.  . [DISCONTINUED] pantoprazole (PROTONIX) 40 MG tablet TAKE 1 TABLET (40 MG TOTAL) BY MOUTH DAILY.  . [DISCONTINUED] sucralfate (CARAFATE) 1 G tablet TAKE 1 TABLET (1 G TOTAL) BY MOUTH 4 (FOUR) TIMES DAILY.       Objective:   Physical Exam  Constitutional: She is oriented to person, place, and time. She appears well-developed and well-nourished.  HENT:  Head: Normocephalic and atraumatic.  Neck: Neck supple. No thyromegaly present.  Cardiovascular: Normal rate,  regular rhythm and normal heart sounds.   Pulmonary/Chest: Effort normal and breath sounds normal.  Abdominal: Soft. Bowel sounds are normal. She exhibits no distension and no mass. There is tenderness. There is no rebound and no guarding.  Tender in the RUQ and the epigastric.   Neurological: She is alert and oriented to person, place, and time.  Skin: Skin is warm and dry.  Psychiatric: She has a normal mood and affect. Her behavior is normal.          Assessment & Plan:  Epigastric Pain /RUQ pain- says that most likely related to her prior history of gastritis. Extremities likely just has a recurrence. We will switch her Prilosec 20 mg back to the Prilosec 40 mg. New prescriptions sent. We will also refill the Carafate. This was helpful at one period time. Reminded her to this only used for a short period of time. Take before each meal and at bedtime. Avoid irritants of the stomach and GI tract. Her bowels are moving normally which is good. We will check some blood work today just to make sure there is no sign of inflammation the liver, pancreas. Also CBC with differential to rule out infection.  Flu shot given today.

## 2014-03-26 LAB — COMPLETE METABOLIC PANEL WITH GFR
ALBUMIN: 4.1 g/dL (ref 3.5–5.2)
ALT: 19 U/L (ref 0–35)
AST: 17 U/L (ref 0–37)
Alkaline Phosphatase: 78 U/L (ref 39–117)
BILIRUBIN TOTAL: 0.8 mg/dL (ref 0.2–1.2)
BUN: 20 mg/dL (ref 6–23)
CO2: 26 mEq/L (ref 19–32)
Calcium: 9.5 mg/dL (ref 8.4–10.5)
Chloride: 102 mEq/L (ref 96–112)
Creat: 1.06 mg/dL (ref 0.50–1.10)
GFR, Est African American: 67 mL/min
GFR, Est Non African American: 58 mL/min — ABNORMAL LOW
Glucose, Bld: 89 mg/dL (ref 70–99)
POTASSIUM: 4.1 meq/L (ref 3.5–5.3)
Sodium: 138 mEq/L (ref 135–145)
TOTAL PROTEIN: 6.8 g/dL (ref 6.0–8.3)

## 2014-03-26 LAB — CBC WITH DIFFERENTIAL/PLATELET
BASOS PCT: 0 % (ref 0–1)
Basophils Absolute: 0 10*3/uL (ref 0.0–0.1)
EOS ABS: 0.1 10*3/uL (ref 0.0–0.7)
Eosinophils Relative: 2 % (ref 0–5)
HEMATOCRIT: 40.4 % (ref 36.0–46.0)
HEMOGLOBIN: 13.7 g/dL (ref 12.0–15.0)
Lymphocytes Relative: 26 % (ref 12–46)
Lymphs Abs: 1.6 10*3/uL (ref 0.7–4.0)
MCH: 29.7 pg (ref 26.0–34.0)
MCHC: 33.9 g/dL (ref 30.0–36.0)
MCV: 87.4 fL (ref 78.0–100.0)
MONOS PCT: 7 % (ref 3–12)
Monocytes Absolute: 0.4 10*3/uL (ref 0.1–1.0)
NEUTROS ABS: 4.1 10*3/uL (ref 1.7–7.7)
Neutrophils Relative %: 65 % (ref 43–77)
Platelets: 275 10*3/uL (ref 150–400)
RBC: 4.62 MIL/uL (ref 3.87–5.11)
RDW: 13.4 % (ref 11.5–15.5)
WBC: 6.3 10*3/uL (ref 4.0–10.5)

## 2014-03-26 LAB — LIPID PANEL
Cholesterol: 185 mg/dL (ref 0–200)
HDL: 50 mg/dL (ref >39)
LDL CALC: 116 mg/dL — AB (ref 0–99)
Total CHOL/HDL Ratio: 3.7 Ratio
Triglycerides: 93 mg/dL (ref <150)
VLDL: 19 mg/dL (ref 0–40)

## 2014-03-26 LAB — AMYLASE: Amylase: 40 U/L (ref 0–105)

## 2014-03-26 LAB — LIPASE: Lipase: 21 U/L (ref 0–75)

## 2014-04-06 ENCOUNTER — Other Ambulatory Visit: Payer: Self-pay | Admitting: Family Medicine

## 2014-04-06 ENCOUNTER — Other Ambulatory Visit: Payer: Self-pay | Admitting: Physician Assistant

## 2014-04-15 ENCOUNTER — Ambulatory Visit: Payer: Federal, State, Local not specified - PPO | Admitting: Family Medicine

## 2014-04-21 ENCOUNTER — Other Ambulatory Visit: Payer: Self-pay | Admitting: Family Medicine

## 2014-05-14 LAB — HM MAMMOGRAPHY

## 2014-05-16 ENCOUNTER — Encounter: Payer: Self-pay | Admitting: Family Medicine

## 2014-05-16 ENCOUNTER — Ambulatory Visit (INDEPENDENT_AMBULATORY_CARE_PROVIDER_SITE_OTHER): Payer: Federal, State, Local not specified - PPO | Admitting: Family Medicine

## 2014-05-16 ENCOUNTER — Ambulatory Visit (INDEPENDENT_AMBULATORY_CARE_PROVIDER_SITE_OTHER): Payer: Federal, State, Local not specified - PPO

## 2014-05-16 VITALS — BP 116/76 | HR 73 | Wt 328.0 lb

## 2014-05-16 DIAGNOSIS — M25561 Pain in right knee: Secondary | ICD-10-CM

## 2014-05-16 MED ORDER — MELOXICAM 15 MG PO TABS: 15.0000 mg | ORAL_TABLET | Freq: Every day | ORAL | Status: DC

## 2014-05-16 NOTE — Progress Notes (Signed)
Subjective:    Patient ID: Rachael Hancock, female    DOB: 1955-11-28, 58 y.o.   MRN: [id number]  HPI Right knee Pain -Says had sudden pain when she tried to stand up from the toilet.  Happened on wednesday she only has pain when she moves, no swelling.  Pain is mostly anterior. Sharp shooting pain.  Once she is standing she is able to ambulate normally.  She feels like it locks.  Not given out.   Has been able to walk on it.  No prior injury or surgery   Review of Systems     BP 116/76 mmHg  Pulse 73  Wt 328 lb (148.78 kg)    Allergies  Allergen Reactions  . Verapamil Other (See Comments)    Constipation.     Past Medical History  Diagnosis Date  . Depression   . Anxiety   . Morbid obesity   . Hypertension   . Reflux   . Palpitations     Past Surgical History  Procedure Laterality Date  . Ankle fracture surgery    . Wisdom tooth extraction    . Cyst removed      History   Social History  . Marital Status: Married    Spouse Name: Tim    Number of Children: 2  . Years of Education: 3 YRS COLL   Occupational History  . CUSTOMER SERVICE REP     [employer]   Social History Main Topics  . Smoking status: Never Smoker   . Smokeless tobacco: Not on file  . Alcohol Use: Yes  . Drug Use: No  . Sexual Activity: Not on file   Other Topics Concern  . 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 Heart attack Father     at age 80  . Cancer Paternal Grandmother   . Coronary artery disease    . Arrhythmia Mother     pacemaker insertion  . Diabetes Maternal Aunt     Outpatient Encounter Prescriptions as of 05/16/2014  Medication Sig  . amLODipine (NORVASC) 5 MG tablet Take 1 tablet (5 mg total) by mouth daily.  . hydrochlorothiazide (MICROZIDE) 12.5 MG capsule TAKE ONE CAPSULE BY MOUTH EVERY DAY   . lisinopril (PRINIVIL,ZESTRIL) 20 MG tablet TAKE 1 TABLET (20 MG TOTAL) BY MOUTH DAILY.  Marland Kitchen loratadine (CLARITIN) 10 MG tablet Take 10 mg by mouth daily.  . metoprolol succinate (TOPROL-XL) 100 MG 24 hr tablet TAKE 1 TABLET (100 MG TOTAL) BY MOUTH DAILY. TAKE WITH OR IMMEDIATELY FOLLOWING A MEAL.  . pantoprazole (PROTONIX) 40 MG tablet TAKE 1 TABLET (40 MG TOTAL) BY MOUTH DAILY.  Marland Kitchen sucralfate (CARAFATE) 1 G tablet TAKE 1 TABLET (1 G TOTAL) BY MOUTH 4 (FOUR) TIMES DAILY - WITH MEALS AND AT BEDTIME. (Patient taking differently: prn)  . triamcinolone cream (KENALOG) 0.1 %   . meloxicam (MOBIC) 15 MG tablet Take 1 tablet (15 mg total) by mouth daily.       Objective:   Physical Exam  Constitutional: She is oriented to person, place, and time. She appears well-developed and well-nourished.  HENT:  Head: Normocephalic and atraumatic.  Musculoskeletal:  Right knee-she's unable to completely flex. She is able to flex to about 90 but then experiences pain in the back  of the knee at the top of the calf muscle. She is mildly tender along the medial joint line. Nontender on the lateral joint line. No significant crepitus with flexion and extension. No anterior drawer laxity. Unable to perform a McMurray's secondary to resistance in the leg.  Neurological: She is alert and oriented to person, place, and time.  Skin: Skin is warm and dry.  Psychiatric: She has a normal mood and affect. Her behavior is normal.          Assessment & Plan:  Right knee Pain - concern for possible cartilage tear. Today in the go-ahead and get an x-ray since it's been very painful. No swelling. She has significant discomfort in the room and was unable to fully flex. We'll go ahead and put her on blocks the cam for anti-inflammatory effect. Refer to sports medicine for further evaluation.

## 2014-05-24 ENCOUNTER — Encounter: Payer: Federal, State, Local not specified - PPO | Admitting: Sports Medicine

## 2014-06-02 ENCOUNTER — Encounter: Payer: Federal, State, Local not specified - PPO | Admitting: Sports Medicine

## 2014-07-20 ENCOUNTER — Telehealth: Payer: Self-pay | Admitting: Family Medicine

## 2014-07-20 NOTE — Telephone Encounter (Addendum)
Please call patient: I did get a copy of her bone density test and it does look like she has thin bones based on her chest. I believe her OB/GYN may have ordered this, Dr. Yves Dill please see if he has been in touch with her about the results.

## 2014-07-26 NOTE — Telephone Encounter (Signed)
Pt told to increase vit d and calcium intake by her ob/gyn.Rachael Hancock [place]

## 2014-08-05 ENCOUNTER — Ambulatory Visit (INDEPENDENT_AMBULATORY_CARE_PROVIDER_SITE_OTHER): Payer: Federal, State, Local not specified - PPO | Admitting: Physician Assistant

## 2014-08-05 ENCOUNTER — Encounter: Payer: Self-pay | Admitting: Physician Assistant

## 2014-08-05 VITALS — BP 137/71 | HR 69

## 2014-08-05 DIAGNOSIS — H10211 Acute toxic conjunctivitis, right eye: Secondary | ICD-10-CM

## 2014-08-05 MED ORDER — PREDNISOLONE ACETATE 1 % OP SUSP: 1.0000 [drp] | Freq: Three times a day (TID) | OPHTHALMIC | Status: DC

## 2014-08-05 NOTE — Progress Notes (Signed)
   Subjective:    Patient ID: Rachael Hancock, female    DOB: [birth year], 59 y.o.   MRN: [id number]  HPI  Pt presents to the clinic with irritated right eye after a small amount of kaboom cleaner spraying her left eye. She denies any vision changes. Her eye burned a lot initally but not just feels scratchy. Called poison control and told her to flush eyes and use cool compresses. Her eye is improving.     Review of Systems  All other systems reviewed and are negative.      Objective:   Physical Exam  Eyes:            Assessment & Plan:  Right eye chemical irritation- fluroscin dye place and looked at with black light. No abrasion. Flushed eye with saline in office. Continue to use cool compresses. Stay away from make up. Use prednisone drops for next few days for inflammation. Reassurance given.

## 2014-08-05 NOTE — Patient Instructions (Signed)
Chemical Conjunctivitis Chemical conjunctivitis is an irritation of the underside of the eyelid and the white part of the eye. Conjunctivitis can be caused by infection, allergy or chemical irritation. In your case it has been caused by a chemical irritation of the eye. Symptoms almost always include: tearing, light sensitivity, gritty feeling (sensation) in the eyes, swelling of your eyelids, and often severe pain. In spite of the severe pain, this irritation will run its course and will improve within 24 hours.  HOME CARE INSTRUCTIONS   To ease discomfort apply a cool, clean wash cloth to your eye for 10 to 20 minutes, 3 to 4 times per day.  Do not rub your eyes.  Gently wipe away any discharge from the eyes with moistened tissues.  Wash your hands often with soap and use paper towels to dry.  Sunglasses may be helpful if light bothers your eyes.  Do not use eye make-up.  Do not use contact lenses until the irritation is gone.  Do not operate machinery or drive if your vision is blurred.  Take medications as directed by your caregiver. Artificial tears may ease discomfort.  Avoid the chemical or surroundings which caused the problem. Always use eye protection as necessary. SEEK MEDICAL CARE IF:   The eye is still pink (inflamed) 3 days after beginning treatment.  Pain in the eye increases.  You have discharge coming from either eye.  Your eyelids are stuck together in the morning.  You have an increased sensitivity to light.  An oral temperature above 102 F (38.9 C) develops.  You develop facial pain.  You have any problems that may be related to the medicine you are taking. SEEK IMMEDIATE MEDICAL CARE IF:   Your vision is getting worse.  You develop severe eye pain. MAKE SURE YOU:   Understand these instructions.  Will watch your condition.  Will get help right away if you are not doing well or get worse. Document Released: 03/20/2005 Document Revised:  09/02/2011 Document Reviewed: 01/27/2008 [facility] Patient Information 2015 [place], [place]. This information is not intended to replace advice given to you by your health care provider. Make sure you discuss any questions you have with your health care provider.

## 2014-09-01 ENCOUNTER — Encounter: Payer: Self-pay | Admitting: Family Medicine

## 2014-09-01 ENCOUNTER — Ambulatory Visit (INDEPENDENT_AMBULATORY_CARE_PROVIDER_SITE_OTHER): Payer: Federal, State, Local not specified - PPO | Admitting: Family Medicine

## 2014-09-01 VITALS — BP 109/64 | HR 70 | Wt 325.0 lb

## 2014-09-01 DIAGNOSIS — M722 Plantar fascial fibromatosis: Secondary | ICD-10-CM

## 2014-09-01 NOTE — Progress Notes (Signed)
   Subjective:    Patient ID: Rachael Hancock, female    DOB: April 11, 1956, 59 y.o.   MRN: [id number]  HPI 1 week of right heel pain. Says any pressure or standing on the foot is painful. She works from home and does know she walks around barefoot on tile floors. She says it feels like a bruise on the bottom of her heel. Worse when first put her weight on it first think in the AM.  Says better as has been up on it.  Sometimes it throbs.  Using IBU and helps some initially    Review of Systems     Objective:   Physical Exam  Constitutional: She is oriented to person, place, and time. She appears well-developed and well-nourished.  HENT:  Head: Normocephalic and atraumatic.  Musculoskeletal:  She has some tenderness right at the base of the heel going towards the arch. No pain into the arch. Nontender around the lateral or medial malleolus. Nontender over the Achilles tendon. Ankle with normal range of motion. Strength is 5 out of 5 in all directions. Toes with normal range of motion. Dorsal pedal pulse 2+.  Neurological: She is alert and oriented to person, place, and time.  Skin: Skin is warm and dry.  Psychiatric: She has a normal mood and affect. Her behavior is normal.          Assessment & Plan:  Plantar fasciitis, right-discussed diagnosis with patient and treatment options. Recommend anti-inflammatory for 1-2 weeks. Make sure take with food and water and stop immediately if any GI upset or irritation. We'll give her a home physical therapy sheet to do exercises on her own at home. Recommend using the tennis ball when she first gets up in the morning to massage the area and loosen up the tendons and tissue. Also recommend icing massage using a water bottle and described her how to do this. If she's not noticing some improvement in about 3-4 weeks then could consider an injection for pain relief. Though I explained that this is temporary. If she's making some improvement then  encourage her to continue with her regimen. Excellent her that it can take several months to control her symptoms. She is also working on weight loss and going to bariatric clinic in his artery lost 15 pounds. This is fantastic and may help with treatment of the planter fasciitis. She also walks around her home barefoot and encouraged her to get supportive shoe wear and not go barefoot.

## 2014-09-01 NOTE — Patient Instructions (Signed)
Plantar Fasciitis  Plantar fasciitis is a common condition that causes foot pain. It is soreness (inflammation) of the band of tough fibrous tissue on the bottom of the foot that runs from the heel bone (calcaneus) to the ball of the foot. The cause of this soreness may be from excessive standing, poor fitting shoes, running on hard surfaces, being overweight, having an abnormal walk, or overuse (this is common in runners) of the painful foot or feet. It is also common in aerobic exercise dancers and ballet dancers.  SYMPTOMS   Most people with plantar fasciitis complain of:   Severe pain in the morning on the bottom of their foot especially when taking the first steps out of bed. This pain recedes after a few minutes of walking.   Severe pain is experienced also during walking following a long period of inactivity.   Pain is worse when walking barefoot or up stairs  DIAGNOSIS    Your caregiver will diagnose this condition by examining and feeling your foot.   Special tests such as X-rays of your foot, are usually not needed.  PREVENTION    Consult a sports medicine professional before beginning a new exercise program.   Walking programs offer a good workout. With walking there is a lower chance of overuse injuries common to runners. There is less impact and less jarring of the joints.   Begin all new exercise programs slowly. If problems or pain develop, decrease the amount of time or distance until you are at a comfortable level.   Wear good shoes and replace them regularly.   Stretch your foot and the heel cords at the back of the ankle (Achilles tendon) both before and after exercise.   Run or exercise on even surfaces that are not hard. For example, asphalt is better than pavement.   Do not run barefoot on hard surfaces.   If using a treadmill, vary the incline.   Do not continue to workout if you have foot or joint problems. Seek professional help if they do not improve.  HOME CARE INSTRUCTIONS     Avoid activities that cause you pain until you recover.   Use ice or cold packs on the problem or painful areas after working out.   Only take over-the-counter or prescription medicines for pain, discomfort, or fever as directed by your caregiver.   Soft shoe inserts or athletic shoes with air or gel sole cushions may be helpful.   If problems continue or become more severe, consult a sports medicine caregiver or your own health care provider. Cortisone is a potent anti-inflammatory medication that may be injected into the painful area. You can discuss this treatment with your caregiver.  MAKE SURE YOU:    Understand these instructions.   Will watch your condition.   Will get help right away if you are not doing well or get worse.  Document Released: 03/05/2001 Document Revised: 09/02/2011 Document Reviewed: 05/04/2008  Exit[employer] Patient Information 2015 [employer]. This information is not intended to replace advice given to you by your health care provider. Make sure you discuss any questions you have with your health care provider.

## 2014-09-15 ENCOUNTER — Other Ambulatory Visit: Payer: Self-pay | Admitting: Family Medicine

## 2014-10-07 ENCOUNTER — Other Ambulatory Visit: Payer: Self-pay | Admitting: Family Medicine

## 2014-10-28 ENCOUNTER — Other Ambulatory Visit: Payer: Self-pay | Admitting: Family Medicine

## 2014-11-05 ENCOUNTER — Other Ambulatory Visit: Payer: Self-pay | Admitting: Family Medicine

## 2014-12-16 ENCOUNTER — Other Ambulatory Visit: Payer: Self-pay | Admitting: Family Medicine

## 2015-01-01 ENCOUNTER — Other Ambulatory Visit: Payer: Self-pay | Admitting: Family Medicine

## 2015-01-08 ENCOUNTER — Other Ambulatory Visit: Payer: Self-pay | Admitting: Family Medicine

## 2015-01-26 ENCOUNTER — Encounter: Payer: Self-pay | Admitting: *Deleted

## 2015-01-30 ENCOUNTER — Other Ambulatory Visit: Payer: Self-pay | Admitting: *Deleted

## 2015-01-30 ENCOUNTER — Other Ambulatory Visit: Payer: Self-pay | Admitting: Family Medicine

## 2015-01-30 MED ORDER — LISINOPRIL 20 MG PO TABS: ORAL_TABLET | ORAL | Status: DC

## 2015-01-30 MED ORDER — HYDROCHLOROTHIAZIDE 12.5 MG PO CAPS: 12.5000 mg | ORAL_CAPSULE | Freq: Every day | ORAL | Status: DC

## 2015-02-09 ENCOUNTER — Ambulatory Visit: Payer: Federal, State, Local not specified - PPO | Admitting: Family Medicine

## 2015-02-24 ENCOUNTER — Ambulatory Visit: Payer: Federal, State, Local not specified - PPO | Admitting: Family Medicine

## 2015-02-28 ENCOUNTER — Ambulatory Visit: Payer: Federal, State, Local not specified - PPO | Admitting: Family Medicine

## 2015-03-07 ENCOUNTER — Ambulatory Visit (INDEPENDENT_AMBULATORY_CARE_PROVIDER_SITE_OTHER): Payer: Federal, State, Local not specified - PPO | Admitting: Family Medicine

## 2015-03-07 ENCOUNTER — Encounter: Payer: Self-pay | Admitting: Family Medicine

## 2015-03-07 VITALS — BP 126/52 | HR 52 | Temp 98.2°F | Wt 291.0 lb

## 2015-03-07 DIAGNOSIS — Z1159 Encounter for screening for other viral diseases: Secondary | ICD-10-CM | POA: Diagnosis not present

## 2015-03-07 DIAGNOSIS — Z23 Encounter for immunization: Secondary | ICD-10-CM

## 2015-03-07 DIAGNOSIS — I10 Essential (primary) hypertension: Secondary | ICD-10-CM | POA: Diagnosis not present

## 2015-03-07 DIAGNOSIS — Z114 Encounter for screening for human immunodeficiency virus [HIV]: Secondary | ICD-10-CM

## 2015-03-07 DIAGNOSIS — Z6841 Body Mass Index (BMI) 40.0 and over, adult: Secondary | ICD-10-CM

## 2015-03-07 MED ORDER — LISINOPRIL 20 MG PO TABS: ORAL_TABLET | ORAL | Status: DC

## 2015-03-07 NOTE — Progress Notes (Signed)
   Subjective:    Patient ID: Rachael Hancock, female    DOB: 1956/06/19, 59 y.o.   MRN: [id number]  HPI Hypertension- Pt denies chest pain, SOB, dizziness, or heart palpitations.  Taking meds as directed w/o problems.  Denies medication side effects.   He is currently on country for weight loss. She has lost 34 pounds since she was last here. She would like to consider discontinuing the hydrochlorothiazide as she does feel like it makes her dizzy at times. She's also on lisinopril 20 mg as well as amlodipine 5.   Review of Systems     Objective:   Physical Exam  Constitutional: She is oriented to person, place, and time. She appears well-developed and well-nourished.  HENT:  Head: Normocephalic and atraumatic.  Cardiovascular: Normal rate, regular rhythm and normal heart sounds.   Pulmonary/Chest: Effort normal and breath sounds normal.  Neurological: She is alert and oriented to person, place, and time.  Skin: Skin is warm and dry.  Psychiatric: She has a normal mood and affect. Her behavior is normal.          Assessment & Plan:  Hypertension-well-controlled. We'll discontinue Hydrochlorothiazide secondary to side effects. We'll continue lisinopril and amlodipine for now. I'll up in 6 months.  Obesity/BMI 44- doing well on Contrave. She has lost 45 lbs awesome.  Trying to walk more.    Flu shot given today.

## 2015-03-08 LAB — COMPLETE METABOLIC PANEL WITH GFR
ALT: 20 U/L (ref 6–29)
AST: 20 U/L (ref 10–35)
Albumin: 4.1 g/dL (ref 3.6–5.1)
Alkaline Phosphatase: 72 U/L (ref 33–130)
BUN: 23 mg/dL (ref 7–25)
CALCIUM: 9 mg/dL (ref 8.6–10.4)
CHLORIDE: 104 mmol/L (ref 98–110)
CO2: 26 mmol/L (ref 20–31)
CREATININE: 0.97 mg/dL (ref 0.50–1.05)
GFR, Est African American: 74 mL/min (ref >=60)
GFR, Est Non African American: 65 mL/min (ref >=60)
GLUCOSE: 85 mg/dL (ref 65–99)
POTASSIUM: 3.8 mmol/L (ref 3.5–5.3)
SODIUM: 142 mmol/L (ref 135–146)
Total Bilirubin: 0.9 mg/dL (ref 0.2–1.2)
Total Protein: 6.8 g/dL (ref 6.1–8.1)

## 2015-03-08 LAB — LIPID PANEL
CHOLESTEROL: 196 mg/dL (ref 125–200)
HDL: 50 mg/dL (ref >=46)
LDL Cholesterol: 131 mg/dL — ABNORMAL HIGH (ref <130)
Total CHOL/HDL Ratio: 3.9 Ratio (ref <=5.0)
Triglycerides: 75 mg/dL (ref <150)
VLDL: 15 mg/dL (ref <30)

## 2015-03-08 LAB — HEPATITIS C ANTIBODY: HCV Ab: NEGATIVE

## 2015-03-08 LAB — HIV ANTIBODY (ROUTINE TESTING W REFLEX): HIV: NONREACTIVE

## 2015-03-21 ENCOUNTER — Encounter: Payer: Self-pay | Admitting: Physician Assistant

## 2015-03-21 ENCOUNTER — Ambulatory Visit (INDEPENDENT_AMBULATORY_CARE_PROVIDER_SITE_OTHER): Payer: Federal, State, Local not specified - PPO | Admitting: Physician Assistant

## 2015-03-21 VITALS — BP 151/60 | HR 58 | Ht 68.0 in | Wt 300.0 lb

## 2015-03-21 DIAGNOSIS — R600 Localized edema: Secondary | ICD-10-CM | POA: Insufficient documentation

## 2015-03-21 MED ORDER — FUROSEMIDE 10 MG/ML IJ SOLN
40.0000 mg | Freq: Once | INTRAMUSCULAR | Status: AC
  Administered 2015-03-21: 40 mg via INTRAMUSCULAR

## 2015-03-21 MED ORDER — FUROSEMIDE 20 MG PO TABS: 20.0000 mg | ORAL_TABLET | Freq: Every day | ORAL | Status: DC

## 2015-03-21 NOTE — Patient Instructions (Signed)

## 2015-03-21 NOTE — Progress Notes (Signed)
   Subjective:    Patient ID: Rachael Hancock, female    DOB: August 04, 1955, 59 y.o.   MRN: [id number]  HPI Patient is a 59 year old female who presents to the clinic with bilateral lower extremity swelling. She has had on and off swelling in the past but easily controlled with foot elevation. She recently got back from the beach and noticed the swelling started at the beach. It has continued to worsen until today. Her bilateral ankles feel tight and swollen. They're starting to ache. She denies any shortness of breath or difficulty breathing. She was seen by Dr. Charise Carwin on 03/07/2015. She was taking off hydrochlorothiazide for dizziness. She has certainly noticed the swelling started after this visit. She continues to take lisinopril and amlodipine pulling for blood pressure. She denies any chest pain or palpitations. She denies any increase in salt.    Review of Systems  All other systems reviewed and are negative.      Objective:   Physical Exam  Constitutional: She is oriented to person, place, and time. She appears well-developed and well-nourished.  HENT:  Head: Normocephalic and atraumatic.  Cardiovascular: Normal rate, regular rhythm and normal heart sounds.   Pulmonary/Chest: Effort normal and breath sounds normal. She has no wheezes.  Musculoskeletal:  2 + pitting edema in bilateral ankles up into bilateral calf.  No varicosities.  No tenderness or erythema.   Neurological: She is alert and oriented to person, place, and time.  Skin: Skin is dry.  Psychiatric: She has a normal mood and affect. Her behavior is normal.          Assessment & Plan:  Bilateral lower extremity edema- IM lasix 40mg  given in office. Will start lasix 20mg  daily. Discussed could be D/C of HCTZ that likely lead to edema. Consider SE of norvasc however she has been on medication for a while but always with HCTZ.  Continue to monitor BP. Discussed decrease salt, compression and elevation. Follow up in  2 weeks with PCP.

## 2015-03-24 ENCOUNTER — Telehealth: Payer: Self-pay | Admitting: *Deleted

## 2015-03-24 NOTE — Telephone Encounter (Signed)
Pt left vm yesterday stating that she noticed a change in her swelling at first after her visit with you but now her right ankle is swelling back up like it was at her office visit.  She wanted to know what suggestions you had for her before the weekend.  Please advise.

## 2015-03-24 NOTE — Telephone Encounter (Signed)
Notified pt of all recommendations.

## 2015-03-24 NOTE — Telephone Encounter (Signed)
Lasix may take up to 40mg  a day for swelling. Compression can can help with elevation. Make sure coming in to see PCP in next 1-2 weeks.

## 2015-03-25 ENCOUNTER — Other Ambulatory Visit: Payer: Self-pay | Admitting: Family Medicine

## 2015-04-16 ENCOUNTER — Other Ambulatory Visit: Payer: Self-pay | Admitting: Physician Assistant

## 2015-04-17 ENCOUNTER — Other Ambulatory Visit: Payer: Self-pay | Admitting: Family Medicine

## 2015-04-25 LAB — HM PAP SMEAR

## 2015-04-26 ENCOUNTER — Encounter: Payer: Self-pay | Admitting: Family Medicine

## 2015-05-31 ENCOUNTER — Encounter: Payer: Self-pay | Admitting: Family Medicine

## 2015-05-31 ENCOUNTER — Ambulatory Visit (INDEPENDENT_AMBULATORY_CARE_PROVIDER_SITE_OTHER): Payer: Federal, State, Local not specified - PPO | Admitting: Family Medicine

## 2015-05-31 VITALS — BP 120/56 | HR 57 | Temp 97.9°F | Resp 16 | Wt 276.7 lb

## 2015-05-31 DIAGNOSIS — J069 Acute upper respiratory infection, unspecified: Secondary | ICD-10-CM | POA: Diagnosis not present

## 2015-05-31 MED ORDER — BENZONATATE 100 MG PO CAPS: 100.0000 mg | ORAL_CAPSULE | Freq: Two times a day (BID) | ORAL | Status: DC | PRN

## 2015-05-31 MED ORDER — HYDROCODONE-HOMATROPINE 5-1.5 MG/5ML PO SYRP: 5.0000 mL | ORAL_SOLUTION | Freq: Every evening | ORAL | Status: DC | PRN

## 2015-05-31 NOTE — Patient Instructions (Signed)
Upper Respiratory Infection, Adult Most upper respiratory infections (URIs) are a viral infection of the air passages leading to the lungs. A URI affects the nose, throat, and upper air passages. The most common type of URI is nasopharyngitis and is typically referred to as "the common cold." URIs run their course and usually go away on their own. Most of the time, a URI does not require medical attention, but sometimes a bacterial infection in the upper airways can follow a viral infection. This is called a secondary infection. Sinus and middle ear infections are common types of secondary upper respiratory infections. Bacterial pneumonia can also complicate a URI. A URI can worsen asthma and chronic obstructive pulmonary disease (COPD). Sometimes, these complications can require emergency medical care and may be life threatening.  CAUSES Almost all URIs are caused by viruses. A virus is a type of germ and can spread from one person to another.  RISKS FACTORS You may be at risk for a URI if:   You smoke.   You have chronic heart or lung disease.  You have a weakened defense (immune) system.   You are very young or very old.   You have nasal allergies or asthma.  You work in crowded or poorly ventilated areas.  You work in health care facilities or schools. SIGNS AND SYMPTOMS  Symptoms typically develop 2-3 days after you come in contact with a cold virus. Most viral URIs last 7-10 days. However, viral URIs from the influenza virus (flu virus) can last 14-18 days and are typically more severe. Symptoms may include:   Runny or stuffy (congested) nose.   Sneezing.   Cough.   Sore throat.   Headache.   Fatigue.   Fever.   Loss of appetite.   Pain in your forehead, behind your eyes, and over your cheekbones (sinus pain).  Muscle aches.  DIAGNOSIS  Your health care provider may diagnose a URI by:  Physical exam.  Tests to check that your symptoms are not due to  another condition such as:  Strep throat.  Sinusitis.  Pneumonia.  Asthma. TREATMENT  A URI goes away on its own with time. It cannot be cured with medicines, but medicines may be prescribed or recommended to relieve symptoms. Medicines may help:  Reduce your fever.  Reduce your cough.  Relieve nasal congestion. HOME CARE INSTRUCTIONS   Take medicines only as directed by your health care provider.   Gargle warm saltwater or take cough drops to comfort your throat as directed by your health care provider.  Use a warm mist humidifier or inhale steam from a shower to increase air moisture. This may make it easier to breathe.  Drink enough fluid to keep your urine clear or pale yellow.   Eat soups and other clear broths and maintain good nutrition.   Rest as needed.   Return to work when your temperature has returned to normal or as your health care provider advises. You may need to stay home longer to avoid infecting others. You can also use a face mask and careful hand washing to prevent spread of the virus.  Increase the usage of your inhaler if you have asthma.   Do not use any tobacco products, including cigarettes, chewing tobacco, or electronic cigarettes. If you need help quitting, ask your health care provider. PREVENTION  The best way to protect yourself from getting a cold is to practice good hygiene.   Avoid oral or hand contact with people with cold   symptoms.   Wash your hands often if contact occurs.  There is no clear evidence that vitamin C, vitamin E, echinacea, or exercise reduces the chance of developing a cold. However, it is always recommended to get plenty of rest, exercise, and practice good nutrition.  SEEK MEDICAL CARE IF:   You are getting worse rather than better.   Your symptoms are not controlled by medicine.   You have chills.  You have worsening shortness of breath.  You have brown or red mucus.  You have yellow or brown nasal  discharge.  You have pain in your face, especially when you bend forward.  You have a fever.  You have swollen neck glands.  You have pain while swallowing.  You have white areas in the back of your throat. SEEK IMMEDIATE MEDICAL CARE IF:   You have severe or persistent:  Headache.  Ear pain.  Sinus pain.  Chest pain.  You have chronic lung disease and any of the following:  Wheezing.  Prolonged cough.  Coughing up blood.  A change in your usual mucus.  You have a stiff neck.  You have changes in your:  Vision.  Hearing.  Thinking.  Mood. MAKE SURE YOU:   Understand these instructions.  Will watch your condition.  Will get help right away if you are not doing well or get worse.   This information is not intended to replace advice given to you by your health care provider. Make sure you discuss any questions you have with your health care provider.   Document Released: 12/04/2000 Document Revised: 10/25/2014 Document Reviewed: 09/15/2013 Elsevier Interactive Patient Education 2016 [employer]  

## 2015-05-31 NOTE — Progress Notes (Signed)
   Subjective:    Patient ID: Rachael Hancock, female    DOB: [birth year], 59 y.o.   MRN: [id number]  HPI Cough, post nasal drip x 5 days. + runny nose. Sever nasal congestion. Phlegm has been loosening up.  Says the phlegm has a bad taste.  No fever, chills.  No GI sxs.  ST the first day but that is some better.  Ears hurt and itch.     Review of Systems     Objective:   Physical Exam  Constitutional: She is oriented to person, place, and time. She appears well-developed and well-nourished.  HENT:  Head: Normocephalic and atraumatic.  Right Ear: External ear normal.  Left Ear: External ear normal.  Nose: Nose normal.  Mouth/Throat: Oropharynx is clear and moist.  TMs and canals are clear.   Eyes: Conjunctivae and EOM are normal. Pupils are equal, round, and reactive to light.  Neck: Neck supple. No thyromegaly present.  Cardiovascular: Normal rate, regular rhythm and normal heart sounds.   Pulmonary/Chest: Effort normal and breath sounds normal. She has no wheezes.  Lymphadenopathy:    She has no cervical adenopathy.  Neurological: She is alert and oriented to person, place, and time.  Skin: Skin is warm and dry.  Psychiatric: She has a normal mood and affect.          Assessment & Plan:  URI - likely viral. Call if not better by the end of the week or by early Monday. Call sooner if feels like she suddenly getting worse. Recommend cough syrup for bedtime and over-the-counter symptomatic care such as a decongestion.

## 2015-06-02 ENCOUNTER — Telehealth: Payer: Self-pay | Admitting: Family Medicine

## 2015-06-02 MED ORDER — AMOXICILLIN-POT CLAVULANATE 875-125 MG PO TABS: 1.0000 | ORAL_TABLET | Freq: Two times a day (BID) | ORAL | Status: DC

## 2015-06-02 NOTE — Telephone Encounter (Signed)
She is getting worse.  I will send in an antibiotic. Her her head throbs.  Has hardly slept the last 2 nights and feels like the cog medicine is not really working. Will go ahead and send in a prescription for Augmentin. Follow-up in one week if not significantly better.

## 2015-06-02 NOTE — Telephone Encounter (Signed)
Pt was advised to contact office today if she was not feeling better. Pt reports she actually feels worse. States she is coughing up "green stuff" and the cough syrup is not helping. Will route to PCP for review.

## 2015-07-28 ENCOUNTER — Other Ambulatory Visit: Payer: Self-pay | Admitting: Family Medicine

## 2015-08-16 ENCOUNTER — Other Ambulatory Visit: Payer: Self-pay | Admitting: Family Medicine

## 2015-08-25 ENCOUNTER — Telehealth: Payer: Self-pay

## 2015-08-25 DIAGNOSIS — K648 Other hemorrhoids: Secondary | ICD-10-CM

## 2015-08-25 MED ORDER — HYDROCORTISONE 2.5 % RE CREA: TOPICAL_CREAM | Freq: Two times a day (BID) | RECTAL | Status: DC

## 2015-08-25 NOTE — Telephone Encounter (Signed)
Rachael Hancock complains of a bothersome hemorrhoid. She would like some medication sent in. She states she was prescribe something in the past. She has tried OTC medications without relief.

## 2015-08-25 NOTE — Telephone Encounter (Signed)
Patient advised.

## 2015-08-25 NOTE — Telephone Encounter (Signed)
Refilled anusol to [facility]

## 2015-09-07 ENCOUNTER — Encounter: Payer: Self-pay | Admitting: Family Medicine

## 2015-09-07 ENCOUNTER — Ambulatory Visit (INDEPENDENT_AMBULATORY_CARE_PROVIDER_SITE_OTHER): Payer: Federal, State, Local not specified - PPO | Admitting: Family Medicine

## 2015-09-07 VITALS — BP 142/56 | HR 54 | Ht 68.0 in | Wt 274.0 lb

## 2015-09-07 DIAGNOSIS — M25511 Pain in right shoulder: Secondary | ICD-10-CM

## 2015-09-07 DIAGNOSIS — M5030 Other cervical disc degeneration, unspecified cervical region: Secondary | ICD-10-CM

## 2015-09-07 DIAGNOSIS — M542 Cervicalgia: Secondary | ICD-10-CM

## 2015-09-07 MED ORDER — PREDNISONE 20 MG PO TABS: 40.0000 mg | ORAL_TABLET | Freq: Every day | ORAL | Status: DC

## 2015-09-07 NOTE — Patient Instructions (Signed)
Once you finish the prednisone then ok to start naproxen 2 tab by mouth twice a day as needed for inflammation.   If you are not improving after 2-3 weeks call to schedule with Dr. Georgina Snell or Dr. Darene Lamer.
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
## 2015-10-12 ENCOUNTER — Other Ambulatory Visit: Payer: Self-pay | Admitting: Family Medicine

## 2015-10-18 DIAGNOSIS — Z6841 Body Mass Index (BMI) 40.0 and over, adult: Secondary | ICD-10-CM | POA: Diagnosis not present

## 2015-10-19 ENCOUNTER — Encounter: Payer: Self-pay | Admitting: Family Medicine

## 2015-10-19 ENCOUNTER — Ambulatory Visit (INDEPENDENT_AMBULATORY_CARE_PROVIDER_SITE_OTHER): Payer: Federal, State, Local not specified - PPO | Admitting: Family Medicine

## 2015-10-19 VITALS — BP 124/70 | HR 56 | Wt 269.0 lb

## 2015-10-19 DIAGNOSIS — L819 Disorder of pigmentation, unspecified: Secondary | ICD-10-CM | POA: Diagnosis not present

## 2015-10-19 DIAGNOSIS — M545 Low back pain: Secondary | ICD-10-CM | POA: Diagnosis not present

## 2015-10-19 DIAGNOSIS — R319 Hematuria, unspecified: Secondary | ICD-10-CM

## 2015-10-19 DIAGNOSIS — Z23 Encounter for immunization: Secondary | ICD-10-CM

## 2015-10-19 DIAGNOSIS — I10 Essential (primary) hypertension: Secondary | ICD-10-CM

## 2015-10-19 LAB — POCT URINALYSIS DIPSTICK
Bilirubin, UA: NEGATIVE
Glucose, UA: NEGATIVE
Ketones, UA: NEGATIVE
Leukocytes, UA: NEGATIVE
Nitrite, UA: NEGATIVE
PH UA: 5.5
PROTEIN UA: NEGATIVE
Spec Grav, UA: 1.02
Urobilinogen, UA: 0.2

## 2015-10-19 MED ORDER — AMLODIPINE BESYLATE 5 MG PO TABS: ORAL_TABLET | ORAL | Status: DC

## 2015-10-19 MED ORDER — METOPROLOL SUCCINATE ER 100 MG PO TB24: ORAL_TABLET | ORAL | Status: DC

## 2015-10-19 MED ORDER — LISINOPRIL 20 MG PO TABS
ORAL_TABLET | ORAL | Status: DC
Start: 2015-10-19 — End: 2016-10-19

## 2015-10-19 MED ORDER — FUROSEMIDE 20 MG PO TABS: ORAL_TABLET | ORAL | Status: DC

## 2015-10-19 MED ORDER — HYDROCHLOROTHIAZIDE 12.5 MG PO CAPS: ORAL_CAPSULE | ORAL | Status: DC

## 2015-10-19 NOTE — Progress Notes (Signed)
   Subjective:    Patient ID: Rachael Hancock, female    DOB: Aug 11, 1955, 60 y.o.   MRN: [id number]  HPI She has a dark pigmented spot on her lower lip. Her dentist noitced it. She said she did not actually notice it herself. She denies any recent rash or irritation of the skin. It is not tender and it doesn't itch. She doesn't remember any specific injury or trauma per se.   She has been having some low back pain for 3 days. She said she woke up with it Tuesday morning and felt like a soreness in her low back. It does not radiate up or down the spine. She does notice that it's worse if she bends over the triceps to sit back up. She's been taking ibuprofen. No heat or ice. She denies any fevers chills etc.. She is worried about a UTI. She's not had any dysuria or frequency or hematuria. She has tried drinking water in the last couple days and does feel like the back pain has actually improved slightly.  Hypertension- Pt denies chest pain, SOB, dizziness, or heart palpitations.  Taking meds as directed w/o problems.  Denies medication side effects.     Review of Systems     Objective:   Physical Exam  Constitutional: She is oriented to person, place, and time. She appears well-developed and well-nourished.  HENT:  Head: Normocephalic and atraumatic.  Cardiovascular: Normal rate, regular rhythm and normal heart sounds.   Pulmonary/Chest: Effort normal and breath sounds normal.  Musculoskeletal:  Normal lumbar flexion and extension. Normal rotation right and left that she did have pain in the low back with rotation to the left. Normal sidebending that she did have pain with sidebending to the right at the lumbar spine. Hip, knee, ankle strength is 5 out of 5 bilaterally. Negative straight leg raise bilaterally.  Neurological: She is alert and oriented to person, place, and time.  Skin: Skin is warm and dry.  Psychiatric: She has a normal mood and affect. Her behavior is normal.         Assessment & Plan:   Pigmentation on the border of the lower lip-it actually seems fairly symmetric to me. I don't see anything that appears to be worrisome like a melanoma etc. I'm not sure if this would indicate an underlying medical problem or not. But I'll try to do some research and let her know.  Acute low back pain-I do feel like it's more likely musculoskeletal that her urinalysis was positive for trace blood so we'll send it for microscopic review and culture and call with results once available. In the meantime okay to continue with ibuprofen. Given handout to do some stretches on her own and encouraged her to use a heating pad to try to relax the muscles. Call if getting worse or not improving over the next 7-10 days. Next  Hypertension-well controlled. Looks fantastic. Check BMP. Refills sent to the pharmacy. Follow-up in 6 months.  Tdap given today.

## 2015-10-20 LAB — URINALYSIS, MICROSCOPIC ONLY
BACTERIA UA: NONE SEEN [HPF]
CASTS: NONE SEEN [LPF]
CRYSTALS: NONE SEEN [HPF]
YEAST: NONE SEEN [HPF]

## 2015-10-20 LAB — URINE CULTURE
COLONY COUNT: NO GROWTH
Organism ID, Bacteria: NO GROWTH

## 2015-10-23 DIAGNOSIS — I10 Essential (primary) hypertension: Secondary | ICD-10-CM | POA: Diagnosis not present

## 2015-10-24 LAB — BASIC METABOLIC PANEL
BUN: 22 mg/dL (ref 7–25)
CALCIUM: 8.8 mg/dL (ref 8.6–10.4)
CHLORIDE: 107 mmol/L (ref 98–110)
CO2: 23 mmol/L (ref 20–31)
Creat: 0.92 mg/dL (ref 0.50–1.05)
Glucose, Bld: 91 mg/dL (ref 65–99)
Potassium: 4.2 mmol/L (ref 3.5–5.3)
SODIUM: 141 mmol/L (ref 135–146)

## 2015-10-24 NOTE — Progress Notes (Signed)
Quick Note:  All labs are normal. ______ 

## 2015-12-20 DIAGNOSIS — Z6841 Body Mass Index (BMI) 40.0 and over, adult: Secondary | ICD-10-CM | POA: Diagnosis not present

## 2016-01-04 ENCOUNTER — Ambulatory Visit (INDEPENDENT_AMBULATORY_CARE_PROVIDER_SITE_OTHER): Payer: Federal, State, Local not specified - PPO | Admitting: Family Medicine

## 2016-01-04 ENCOUNTER — Encounter: Payer: Self-pay | Admitting: Family Medicine

## 2016-01-04 VITALS — BP 128/59 | HR 55 | Wt 269.0 lb

## 2016-01-04 DIAGNOSIS — L237 Allergic contact dermatitis due to plants, except food: Secondary | ICD-10-CM

## 2016-01-04 MED ORDER — TRIAMCINOLONE ACETONIDE 0.5 % EX OINT: 1.0000 "application " | TOPICAL_OINTMENT | Freq: Two times a day (BID) | CUTANEOUS | Status: DC

## 2016-01-04 NOTE — Progress Notes (Signed)
Subjective:    CC: Poison ivy  HPI: She was out in the yard working over the weekend. She used a wash to remove any plant oil after exposure to the poison ivy.  Then on Monday she noticed a rash breaking out around her neck. In the last couple days she's noticed a couple spots on her hands as well. She's been using a Caladryl as well as some type of foam over-the-counter medication. She says it does seem to help with the itching and helps her to not scratch but it's just not going away.   Objective:    General: Well Developed, well nourished, and in no acute distress.  Neuro: Alert and oriented x3, extra-ocular muscles intact, sensation grossly intact.  HEENT: Normocephalic, atraumatic  Skin: erythematous maculopapular rash on her neck and behind her left ear and some vesicles on several fingers.    Impression and Recommendations:    Poison ivy dermatitis-discussed options. Because the areas exposed are limited to her neck and a couple of fingers recommend topical treatment with triamcinolone 0.5% ointment. Call if not improving or getting worse over the weekend. H.O given.

## 2016-01-04 NOTE — Patient Instructions (Signed)

## 2016-03-24 DIAGNOSIS — K219 Gastro-esophageal reflux disease without esophagitis: Secondary | ICD-10-CM | POA: Diagnosis not present

## 2016-03-24 DIAGNOSIS — N201 Calculus of ureter: Secondary | ICD-10-CM | POA: Diagnosis not present

## 2016-03-24 DIAGNOSIS — R1032 Left lower quadrant pain: Secondary | ICD-10-CM | POA: Diagnosis not present

## 2016-03-24 DIAGNOSIS — N132 Hydronephrosis with renal and ureteral calculous obstruction: Secondary | ICD-10-CM | POA: Diagnosis not present

## 2016-03-24 DIAGNOSIS — R110 Nausea: Secondary | ICD-10-CM | POA: Diagnosis not present

## 2016-03-24 DIAGNOSIS — Z888 Allergy status to other drugs, medicaments and biological substances status: Secondary | ICD-10-CM | POA: Diagnosis not present

## 2016-03-24 DIAGNOSIS — K449 Diaphragmatic hernia without obstruction or gangrene: Secondary | ICD-10-CM | POA: Diagnosis not present

## 2016-03-24 DIAGNOSIS — Z79899 Other long term (current) drug therapy: Secondary | ICD-10-CM | POA: Diagnosis not present

## 2016-03-24 DIAGNOSIS — I10 Essential (primary) hypertension: Secondary | ICD-10-CM | POA: Diagnosis not present

## 2016-03-26 DIAGNOSIS — N200 Calculus of kidney: Secondary | ICD-10-CM | POA: Diagnosis not present

## 2016-03-26 DIAGNOSIS — N132 Hydronephrosis with renal and ureteral calculous obstruction: Secondary | ICD-10-CM | POA: Diagnosis not present

## 2016-03-26 DIAGNOSIS — Z466 Encounter for fitting and adjustment of urinary device: Secondary | ICD-10-CM | POA: Diagnosis not present

## 2016-03-26 DIAGNOSIS — Z79899 Other long term (current) drug therapy: Secondary | ICD-10-CM | POA: Diagnosis not present

## 2016-03-26 DIAGNOSIS — Z888 Allergy status to other drugs, medicaments and biological substances status: Secondary | ICD-10-CM | POA: Diagnosis not present

## 2016-03-26 DIAGNOSIS — K219 Gastro-esophageal reflux disease without esophagitis: Secondary | ICD-10-CM | POA: Diagnosis not present

## 2016-03-26 DIAGNOSIS — N201 Calculus of ureter: Secondary | ICD-10-CM | POA: Diagnosis not present

## 2016-03-26 DIAGNOSIS — I10 Essential (primary) hypertension: Secondary | ICD-10-CM | POA: Diagnosis not present

## 2016-03-26 DIAGNOSIS — G4730 Sleep apnea, unspecified: Secondary | ICD-10-CM | POA: Diagnosis not present

## 2016-03-27 DIAGNOSIS — N201 Calculus of ureter: Secondary | ICD-10-CM | POA: Diagnosis not present

## 2016-03-27 DIAGNOSIS — Z87442 Personal history of urinary calculi: Secondary | ICD-10-CM | POA: Diagnosis not present

## 2016-03-27 DIAGNOSIS — R1030 Lower abdominal pain, unspecified: Secondary | ICD-10-CM | POA: Diagnosis not present

## 2016-03-27 DIAGNOSIS — Z79899 Other long term (current) drug therapy: Secondary | ICD-10-CM | POA: Diagnosis not present

## 2016-03-27 DIAGNOSIS — I10 Essential (primary) hypertension: Secondary | ICD-10-CM | POA: Diagnosis not present

## 2016-03-27 DIAGNOSIS — K219 Gastro-esophageal reflux disease without esophagitis: Secondary | ICD-10-CM | POA: Diagnosis not present

## 2016-03-27 DIAGNOSIS — Z888 Allergy status to other drugs, medicaments and biological substances status: Secondary | ICD-10-CM | POA: Diagnosis not present

## 2016-03-27 DIAGNOSIS — R110 Nausea: Secondary | ICD-10-CM | POA: Diagnosis not present

## 2016-03-28 DIAGNOSIS — N201 Calculus of ureter: Secondary | ICD-10-CM | POA: Diagnosis not present

## 2016-04-02 DIAGNOSIS — N200 Calculus of kidney: Secondary | ICD-10-CM | POA: Diagnosis not present

## 2016-04-03 ENCOUNTER — Telehealth: Payer: Self-pay | Admitting: *Deleted

## 2016-04-08 DIAGNOSIS — K219 Gastro-esophageal reflux disease without esophagitis: Secondary | ICD-10-CM | POA: Diagnosis not present

## 2016-04-08 DIAGNOSIS — N200 Calculus of kidney: Secondary | ICD-10-CM | POA: Diagnosis not present

## 2016-04-08 DIAGNOSIS — Z79899 Other long term (current) drug therapy: Secondary | ICD-10-CM | POA: Diagnosis not present

## 2016-04-08 DIAGNOSIS — Z888 Allergy status to other drugs, medicaments and biological substances status: Secondary | ICD-10-CM | POA: Diagnosis not present

## 2016-04-08 DIAGNOSIS — I10 Essential (primary) hypertension: Secondary | ICD-10-CM | POA: Diagnosis not present

## 2016-04-08 DIAGNOSIS — G4730 Sleep apnea, unspecified: Secondary | ICD-10-CM | POA: Diagnosis not present

## 2016-04-08 DIAGNOSIS — N132 Hydronephrosis with renal and ureteral calculous obstruction: Secondary | ICD-10-CM | POA: Diagnosis not present

## 2016-04-08 DIAGNOSIS — Z833 Family history of diabetes mellitus: Secondary | ICD-10-CM | POA: Diagnosis not present

## 2016-04-08 DIAGNOSIS — N23 Unspecified renal colic: Secondary | ICD-10-CM | POA: Diagnosis not present

## 2016-04-08 DIAGNOSIS — Z6841 Body Mass Index (BMI) 40.0 and over, adult: Secondary | ICD-10-CM | POA: Diagnosis not present

## 2016-04-08 DIAGNOSIS — R002 Palpitations: Secondary | ICD-10-CM | POA: Diagnosis not present

## 2016-04-08 DIAGNOSIS — G43909 Migraine, unspecified, not intractable, without status migrainosus: Secondary | ICD-10-CM | POA: Diagnosis not present

## 2016-04-12 DIAGNOSIS — N200 Calculus of kidney: Secondary | ICD-10-CM | POA: Diagnosis not present

## 2016-04-16 ENCOUNTER — Other Ambulatory Visit: Payer: Self-pay | Admitting: Family Medicine

## 2016-04-19 NOTE — Telephone Encounter (Signed)
Encounter opened in error.Rachael Hancock

## 2016-05-20 DIAGNOSIS — N390 Urinary tract infection, site not specified: Secondary | ICD-10-CM | POA: Diagnosis not present

## 2016-05-20 DIAGNOSIS — R350 Frequency of micturition: Secondary | ICD-10-CM | POA: Diagnosis not present

## 2016-06-01 DIAGNOSIS — Z1231 Encounter for screening mammogram for malignant neoplasm of breast: Secondary | ICD-10-CM | POA: Diagnosis not present

## 2016-06-01 LAB — HM MAMMOGRAPHY

## 2016-07-02 DIAGNOSIS — Z01419 Encounter for gynecological examination (general) (routine) without abnormal findings: Secondary | ICD-10-CM | POA: Diagnosis not present

## 2016-07-02 DIAGNOSIS — Z6841 Body Mass Index (BMI) 40.0 and over, adult: Secondary | ICD-10-CM | POA: Diagnosis not present

## 2016-07-02 LAB — RESULTS CONSOLE HPV: CHL HPV: NEGATIVE

## 2016-07-02 LAB — HM PAP SMEAR: HM Pap smear: NEGATIVE

## 2016-09-01 ENCOUNTER — Encounter: Payer: Self-pay | Admitting: Family Medicine

## 2016-09-16 DIAGNOSIS — N760 Acute vaginitis: Secondary | ICD-10-CM | POA: Diagnosis not present

## 2016-09-16 DIAGNOSIS — R3915 Urgency of urination: Secondary | ICD-10-CM | POA: Diagnosis not present

## 2016-10-19 ENCOUNTER — Other Ambulatory Visit: Payer: Self-pay | Admitting: Family Medicine

## 2016-10-24 ENCOUNTER — Encounter: Payer: Self-pay | Admitting: Family Medicine

## 2016-10-24 ENCOUNTER — Other Ambulatory Visit: Payer: Self-pay | Admitting: Family Medicine

## 2016-10-24 ENCOUNTER — Ambulatory Visit (INDEPENDENT_AMBULATORY_CARE_PROVIDER_SITE_OTHER): Payer: Federal, State, Local not specified - PPO | Admitting: Family Medicine

## 2016-10-24 VITALS — BP 130/66 | HR 76 | Ht 68.0 in | Wt 260.0 lb

## 2016-10-24 DIAGNOSIS — R635 Abnormal weight gain: Secondary | ICD-10-CM

## 2016-10-24 DIAGNOSIS — Z6839 Body mass index (BMI) 39.0-39.9, adult: Secondary | ICD-10-CM | POA: Diagnosis not present

## 2016-10-24 DIAGNOSIS — I10 Essential (primary) hypertension: Secondary | ICD-10-CM

## 2016-10-24 LAB — LIPID PANEL
Cholesterol: 193 mg/dL (ref <200)
HDL: 59 mg/dL (ref >50)
LDL CALC: 118 mg/dL — AB (ref <100)
TRIGLYCERIDES: 78 mg/dL (ref <150)
Total CHOL/HDL Ratio: 3.3 Ratio (ref <5.0)
VLDL: 16 mg/dL (ref <30)

## 2016-10-24 LAB — COMPLETE METABOLIC PANEL WITH GFR
ALT: 14 U/L (ref 6–29)
AST: 14 U/L (ref 10–35)
Albumin: 4.1 g/dL (ref 3.6–5.1)
Alkaline Phosphatase: 71 U/L (ref 33–130)
BILIRUBIN TOTAL: 0.7 mg/dL (ref 0.2–1.2)
BUN: 22 mg/dL (ref 7–25)
CO2: 25 mmol/L (ref 20–31)
Calcium: 9.2 mg/dL (ref 8.6–10.4)
Chloride: 105 mmol/L (ref 98–110)
Creat: 0.86 mg/dL (ref 0.50–1.05)
GFR, EST AFRICAN AMERICAN: 86 mL/min (ref >=60)
GFR, EST NON AFRICAN AMERICAN: 74 mL/min (ref >=60)
Glucose, Bld: 91 mg/dL (ref 65–99)
POTASSIUM: 4 mmol/L (ref 3.5–5.3)
Sodium: 140 mmol/L (ref 135–146)
TOTAL PROTEIN: 6.9 g/dL (ref 6.1–8.1)

## 2016-10-24 LAB — TSH: TSH: 4.92 mIU/L — ABNORMAL HIGH

## 2016-10-24 MED ORDER — NALTREXONE-BUPROPION HCL ER 8-90 MG PO TB12: 1.0000 | ORAL_TABLET | Freq: Two times a day (BID) | ORAL | 1 refills | Status: DC

## 2016-10-24 NOTE — Progress Notes (Signed)
Subjective:    CC: HTN, needs refills.   HPI:  Hypertension- Pt denies chest pain, SOB, dizziness, or heart palpitations.  Taking meds as directed w/o problems.  Denies medication side effects.    Obesity, moribid - she is doing well.  Now off the Contrave for about 6 months. She been on it for well over a year previously.. She has lost about 40 lbs since 02/2015.  She's lost a total of about 75 pounds she originally started. She admits she hasn't been doing her best with exercising recently and has [place]by off on this. She normally gets on the treadmill at home. She feels like she is trying to do a good job with her diet and has been able to at least maintain her weight over the last 6 months. She wants know she could restart country.  Past medical history, Surgical history, Family history not pertinant except as noted below, Social history, Allergies, and medications have been entered into the medical record, reviewed, and corrections made.   Review of Systems: No fevers, chills, night sweats, weight loss, chest pain, or shortness of breath.   Objective:    General: Well Developed, well nourished, and in no acute distress.  Neuro: Alert and oriented x3, extra-ocular muscles intact, sensation grossly intact.  HEENT: Normocephalic, atraumatic  Skin: Warm and dry, no rashes. Cardiac: Regular rate and rhythm, no murmurs rubs or gallops, no lower extremity edema.  Respiratory: Clear to auscultation bilaterally. Not using accessory muscles, speaking in full sentences.   Impression and Recommendations:    HTN - Well controlled. Continue current regimen. Due for CMP and lipid panel. Follow-up in 6 months.  BMI 39 /abnormal weight gain-- I would certainly be happy to restart her Contrave.  This encourage her to get back on track with her regular exercise. Continue to work on [employer]. And follow-up in 2 months for nurse blood pressure check and weight visit. But if she's having difficulty did  encourage her to schedule with me instead. We'll also recheck TSH.  She did have her mammogram in December 2017 still get this entered into the system.

## 2016-10-28 LAB — T4, FREE: Free T4: 1 ng/dL (ref 0.8–1.8)

## 2016-10-29 ENCOUNTER — Telehealth: Payer: Self-pay

## 2016-10-29 ENCOUNTER — Other Ambulatory Visit: Payer: Self-pay

## 2016-10-29 ENCOUNTER — Other Ambulatory Visit: Payer: Self-pay | Admitting: Family Medicine

## 2016-10-29 MED ORDER — AMLODIPINE BESYLATE 5 MG PO TABS: 5.0000 mg | ORAL_TABLET | Freq: Every day | ORAL | 0 refills | Status: DC

## 2016-10-29 NOTE — Telephone Encounter (Signed)
Pt needs a refill on amlodipine, please advise.

## 2016-10-29 NOTE — Telephone Encounter (Signed)
OK to fill

## 2016-10-29 NOTE — Telephone Encounter (Signed)
Notified patient that refill was sent

## 2016-11-15 ENCOUNTER — Encounter: Payer: Self-pay | Admitting: Family Medicine

## 2016-12-04 ENCOUNTER — Other Ambulatory Visit: Payer: Self-pay | Admitting: Family Medicine

## 2017-01-27 ENCOUNTER — Other Ambulatory Visit: Payer: Self-pay | Admitting: Family Medicine

## 2017-06-02 ENCOUNTER — Other Ambulatory Visit: Payer: Self-pay | Admitting: Family Medicine

## 2017-06-07 DIAGNOSIS — Z1231 Encounter for screening mammogram for malignant neoplasm of breast: Secondary | ICD-10-CM | POA: Diagnosis not present

## 2017-07-08 DIAGNOSIS — Z01419 Encounter for gynecological examination (general) (routine) without abnormal findings: Secondary | ICD-10-CM | POA: Diagnosis not present

## 2017-07-08 DIAGNOSIS — Z6841 Body Mass Index (BMI) 40.0 and over, adult: Secondary | ICD-10-CM | POA: Diagnosis not present

## 2017-07-14 ENCOUNTER — Other Ambulatory Visit: Payer: Self-pay | Admitting: Family Medicine

## 2017-08-01 ENCOUNTER — Ambulatory Visit: Payer: Federal, State, Local not specified - PPO | Admitting: Physician Assistant

## 2017-08-01 ENCOUNTER — Encounter: Payer: Self-pay | Admitting: Physician Assistant

## 2017-08-01 VITALS — BP 143/69 | HR 79 | Temp 98.0°F | Ht 68.0 in | Wt 276.0 lb

## 2017-08-01 DIAGNOSIS — J0100 Acute maxillary sinusitis, unspecified: Secondary | ICD-10-CM

## 2017-08-01 MED ORDER — AMOXICILLIN-POT CLAVULANATE 875-125 MG PO TABS
1.0000 | ORAL_TABLET | Freq: Two times a day (BID) | ORAL | 0 refills | Status: DC
Start: 2017-08-01 — End: 2017-09-04

## 2017-08-01 MED ORDER — FLUTICASONE PROPIONATE 50 MCG/ACT NA SUSP: 2.0000 | Freq: Every day | NASAL | 6 refills | Status: DC

## 2017-08-01 MED ORDER — METHYLPREDNISOLONE 4 MG PO TBPK: ORAL_TABLET | ORAL | 0 refills | Status: DC

## 2017-08-01 NOTE — Progress Notes (Signed)
   Subjective:    Patient ID: Rachael Hancock, female    DOB: [birth year], 62 y.o.   MRN: [id number]  HPI  Pt is a 62 yo female with hx of allergic rhinitis who presents to the clinic with sinus pressure, sinus drainage, headache, lots of sputum production. Cough is dry. Denies any fever, chills, body aches, wheezing. She does have some ear pressure. She admits to sneezing. She is taking claritin and dayquil at this time. Symptoms have been present for about 10 days.   .. Active Ambulatory Problems    Diagnosis Date Noted  . VITAMIN D DEFICIENCY 07/14/2009  . MORBID OBESITY 11/07/2009  . ANXIETY STATE, UNSPECIFIED 11/07/2009  . DEPRESSION, MILD 11/07/2009  . ESSENTIAL HYPERTENSION, BENIGN 08/26/2008  . ALLERGIC RHINITIS 01/13/2009  . GERD 01/13/2009  . SHOULDER PAIN 08/26/2008  . LEG CRAMPS 03/15/2009  . PALPITATIONS 08/01/2009  . OSA (obstructive sleep apnea) 12/23/2011  . Bilateral lower extremity edema 03/21/2015   Resolved Ambulatory Problems    Diagnosis Date Noted  . LOM 07/14/2009  . LEG PAIN 05/23/2009   Past Medical History:  Diagnosis Date  . Anxiety   . Depression   . Hypertension   . Morbid obesity ([place])   . Palpitations   . Reflux      Review of Systems  All other systems reviewed and are negative.      Objective:   Physical Exam  Constitutional: She is oriented to person, place, and time. She appears well-developed and well-nourished.  HENT:  Head: Normocephalic and atraumatic.  Right Ear: External ear normal.  Left Ear: External ear normal.  TM's clear bilaterally.   Tenderness over maxillary sinuses and nasal bridge.   Oropharynx erythematous with red swollen nasal turbinates.   Eyes: Conjunctivae are normal. Right eye exhibits no discharge. Left eye exhibits no discharge.  Neck: Normal range of motion. Neck supple.  Cardiovascular: Normal rate, regular rhythm and normal heart sounds.  Pulmonary/Chest: Effort normal and breath sounds  normal. She has no wheezes.  Lymphadenopathy:    She has no cervical adenopathy.  Neurological: She is alert and oriented to person, place, and time.  Psychiatric: She has a normal mood and affect. Her behavior is normal.          Assessment & Plan:  Marland KitchenMarland KitchenBrooklyne was seen today for cough, headache, sinus pressure and nasal congestion.  Diagnoses and all orders for this visit:  Acute non-recurrent maxillary sinusitis -     fluticasone (FLONASE) 50 MCG/ACT nasal spray; Place 2 sprays into both nostrils daily. -     methylPREDNISolone (MEDROL) 4 MG TBPK tablet; Take as directed by package insert -     amoxicillin-clavulanate (AUGMENTIN) 875-125 MG tablet; Take 1 tablet by mouth 2 (two) times daily. For 10 days.   Discussed with the patient seemed allergic vs viral sinusitis at this time. Start with flonase and medrol dose pack. If not improving or if symptoms persistent then consider adding augmentin. HO given.

## 2017-08-01 NOTE — Patient Instructions (Signed)

## 2017-08-02 ENCOUNTER — Encounter: Payer: Self-pay | Admitting: Physician Assistant

## 2017-09-04 ENCOUNTER — Ambulatory Visit: Payer: Federal, State, Local not specified - PPO | Admitting: Family Medicine

## 2017-09-04 ENCOUNTER — Encounter: Payer: Self-pay | Admitting: Family Medicine

## 2017-09-04 ENCOUNTER — Ambulatory Visit (INDEPENDENT_AMBULATORY_CARE_PROVIDER_SITE_OTHER): Payer: Federal, State, Local not specified - PPO

## 2017-09-04 VITALS — BP 132/57 | HR 74 | Ht 68.0 in | Wt 280.0 lb

## 2017-09-04 DIAGNOSIS — M25511 Pain in right shoulder: Secondary | ICD-10-CM

## 2017-09-04 DIAGNOSIS — R7989 Other specified abnormal findings of blood chemistry: Secondary | ICD-10-CM

## 2017-09-04 MED ORDER — NAPROXEN 500 MG PO TABS: 500.0000 mg | ORAL_TABLET | Freq: Two times a day (BID) | ORAL | 1 refills | Status: DC
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
## 2017-09-05 LAB — TSH: TSH: 3.4 m[IU]/L (ref 0.40–4.50)

## 2017-09-05 LAB — T4, FREE: FREE T4: 1 ng/dL (ref 0.8–1.8)

## 2017-09-09 ENCOUNTER — Ambulatory Visit: Payer: Federal, State, Local not specified - PPO | Admitting: Sports Medicine

## 2017-09-09 ENCOUNTER — Encounter: Payer: Self-pay | Admitting: Sports Medicine

## 2017-09-09 DIAGNOSIS — M7501 Adhesive capsulitis of right shoulder: Secondary | ICD-10-CM

## 2017-09-09 MED ORDER — MELOXICAM 15 MG PO TABS: ORAL_TABLET | ORAL | 3 refills | Status: DC

## 2017-09-09 NOTE — Assessment & Plan Note (Addendum)
Right glenohumeral injection, formal physical therapy.   Return to see me in 1 month, if insufficient relief she will probably need a shoulder arthroplasty.
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
## 2017-09-15 ENCOUNTER — Other Ambulatory Visit: Payer: Self-pay | Admitting: *Deleted

## 2017-09-15 MED ORDER — LISINOPRIL 20 MG PO TABS: 20.0000 mg | ORAL_TABLET | Freq: Every day | ORAL | 0 refills | Status: DC

## 2017-10-09 ENCOUNTER — Telehealth: Payer: Self-pay

## 2017-10-09 NOTE — Telephone Encounter (Signed)
We did a shoulder joint injection at the last visit, if this is insufficient then we need to proceed to the next step, we did have a discussion in the office visit that we would need to try a stepwise approach, but if that did not work she would be a candidate for shoulder arthroplasty.  This is why I requested a 1 month follow-up to make sure she was doing better, if she is still having significant pain I am happy to do a referral to 1 of our shoulder surgeons.

## 2017-10-09 NOTE — Telephone Encounter (Signed)
Left message advising of recommendations.  

## 2017-10-09 NOTE — Telephone Encounter (Signed)
Rachael Hancock called and was very upset because she still has shoulder pain. She is only taking ibuprofen. I did advise patient that Dr Dianah Field noted for her to return in 4 weeks. She states she can not afford to keep spending $ 200 dollars for an appointment. I told her I can send a message for what would be the next step. She stated she needed to talk with her husband and hung up the phone.

## 2017-10-16 ENCOUNTER — Telehealth: Payer: Self-pay

## 2017-10-16 NOTE — Telephone Encounter (Signed)
Pt states she only received 3 days of relief after getting injection and her pain was to high to go to PT. Would like to know what she should do next. Please advise.

## 2017-10-16 NOTE — Telephone Encounter (Signed)
Probably time for shoulder replacement, I can do a referral to our shoulder surgeon if she would like.

## 2017-10-28 ENCOUNTER — Encounter: Payer: Self-pay | Admitting: Sports Medicine

## 2017-10-28 ENCOUNTER — Ambulatory Visit: Payer: Federal, State, Local not specified - PPO | Admitting: Sports Medicine

## 2017-10-28 DIAGNOSIS — M7501 Adhesive capsulitis of right shoulder: Secondary | ICD-10-CM | POA: Diagnosis not present

## 2017-10-28 MED ORDER — TRAMADOL HCL 50 MG PO TABS: 50.0000 mg | ORAL_TABLET | Freq: Three times a day (TID) | ORAL | 0 refills | Status: DC | PRN

## 2017-10-28 NOTE — Progress Notes (Signed)
Subjective:    CC: Follow-up  HPI: This is a pleasant 62 year old female, she has right shoulder pain, chronically, we did a glenohumeral injection that provided only a few days of relief, x-rays did show severe end-stage glenohumeral osteoarthritis.  She returns today with near complete return of her pain, meloxicam is ineffective.  Unable to sleep due to the pain.  I reviewed the past medical history, family history, social history, surgical history, and allergies today and no changes were needed.  Please see the problem list section below in epic for further details.  Past Medical History: Past Medical History:  Diagnosis Date  . Anxiety   . Depression   . Hypertension   . Morbid obesity ([place])   . Palpitations   . Reflux    Past Surgical History: Past Surgical History:  Procedure Laterality Date  . ANKLE FRACTURE SURGERY    . cyst removed    . WISDOM TOOTH EXTRACTION     Social History: Social History   Socioeconomic History  . Marital status: Married    Spouse name: Octavia Bruckner  . Number of children: 2  . Years of education: 3 YRS COLL  . Highest education level: Not on file  Occupational History  . Occupation: Barista SERVICE REP    Employer: SELF    Comment: [employer]  Social Needs  . Financial resource strain: Not on file  . Food insecurity:    Worry: Not on file    Inability: Not on file  . Transportation needs:    Medical: Not on file    Non-medical: Not on file  Tobacco Use  . Smoking status: Never Smoker  . Smokeless tobacco: Never Used  Substance and Sexual Activity  . Alcohol use: Yes    Alcohol/week: 0.0 oz  . Drug use: No  . Sexual activity: Not on file  Lifestyle  . Physical activity:    Days per week: Not on file    Minutes per session: Not on file  . Stress: Not on file  Relationships  . Social connections:    Talks on phone: Not on file    Gets together: Not on file    Attends religious service: Not on file    Active member  of club or organization: Not on file    Attends meetings of clubs or organizations: Not on file    Relationship status: Not on file  Other Topics Concern  . 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 Heart attack Father        at age 66  . Cancer Paternal Grandmother   . Coronary artery disease Unknown   . Arrhythmia Mother        pacemaker insertion  . Diabetes Maternal Aunt    Allergies: Allergies  Allergen Reactions  . Verapamil Other (See Comments)    Constipation.    Medications: See med rec.  Review of Systems: No fevers, chills, night sweats, weight loss, chest pain, or shortness of breath.   Objective:    General: Well Developed, well nourished, and in no acute distress.  Neuro: Alert and oriented x3, extra-ocular muscles intact, sensation grossly intact.  HEENT: Normocephalic, atraumatic, pupils equal round reactive to light, neck supple, no masses, no lymphadenopathy, thyroid nonpalpable.  Skin: Warm and dry, no  rashes. Cardiac: Regular rate and rhythm, no murmurs rubs or gallops, no lower extremity edema.  Respiratory: Clear to auscultation bilaterally. Not using accessory muscles, speaking in full sentences.  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 Only minimal improvement with meloxicam, adding tramadol. Referral to Dr. Tamera Punt for consideration of total shoulder arthroplasty. ___________________________________________ Gwen Her. Dianah Field, M.D., ABFM., CAQSM. Primary Care and [place] Instructor of [place] of [facility]

## 2017-10-28 NOTE — Assessment & Plan Note (Signed)
Severe end-stage osteoarthritis, failed an ultrasound-guided glenohumeral injection with only a few days of relief. Only minimal improvement with meloxicam, adding tramadol. Referral to Dr. Tamera Punt for consideration of total shoulder arthroplasty.

## 2017-11-05 ENCOUNTER — Encounter: Payer: Self-pay | Admitting: Physician Assistant

## 2017-11-05 ENCOUNTER — Ambulatory Visit (INDEPENDENT_AMBULATORY_CARE_PROVIDER_SITE_OTHER): Payer: Federal, State, Local not specified - PPO

## 2017-11-05 ENCOUNTER — Ambulatory Visit: Payer: Federal, State, Local not specified - PPO | Admitting: Physician Assistant

## 2017-11-05 VITALS — BP 137/59 | HR 81 | Ht 68.0 in | Wt 282.0 lb

## 2017-11-05 DIAGNOSIS — M25551 Pain in right hip: Secondary | ICD-10-CM

## 2017-11-05 DIAGNOSIS — R1032 Left lower quadrant pain: Secondary | ICD-10-CM

## 2017-11-05 DIAGNOSIS — M160 Bilateral primary osteoarthritis of hip: Secondary | ICD-10-CM | POA: Diagnosis not present

## 2017-11-05 DIAGNOSIS — M25552 Pain in left hip: Secondary | ICD-10-CM

## 2017-11-05 MED ORDER — NAPROXEN 500 MG PO TABS: 500.0000 mg | ORAL_TABLET | Freq: Two times a day (BID) | ORAL | 5 refills | Status: DC

## 2017-11-05 MED ORDER — LORCASERIN HCL ER 20 MG PO TB24: 1.0000 | ORAL_TABLET | Freq: Every day | ORAL | 1 refills | Status: DC

## 2017-11-05 NOTE — Progress Notes (Signed)
   Subjective:    Patient ID: Rachael Hancock, female    DOB: 08-28-1955, 62 y.o.   MRN: [id number]  HPI  Pt is a 62 yo morbidly obese female who presents to the clinic with left groin pain that has started fairly suddenly over past 2 weeks. No known injury. She is taking mobic for right shoulder frozen shoulder. She felt ibuprofen helped more than mobic. She has tramadol and does not seem to help but does put her to sleep which helps her a little with discomfort. At times the pain radiates down left leg. Denies any weakness of lower extremities or numbness and tingling. Pain is worse with external rotation.   .. Active Ambulatory Problems    Diagnosis Date Noted  . VITAMIN D DEFICIENCY 07/14/2009  . MORBID OBESITY 11/07/2009  . ANXIETY STATE, UNSPECIFIED 11/07/2009  . DEPRESSION, MILD 11/07/2009  . ESSENTIAL HYPERTENSION, BENIGN 08/26/2008  . ALLERGIC RHINITIS 01/13/2009  . GERD 01/13/2009  . SHOULDER PAIN 08/26/2008  . LEG CRAMPS 03/15/2009  . PALPITATIONS 08/01/2009  . OSA (obstructive sleep apnea) 12/23/2011  . Bilateral lower extremity edema 03/21/2015  . 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 LOM 07/14/2009  . LEG PAIN 05/23/2009   Past Medical History:  Diagnosis Date  . Anxiety   . Depression   . Hypertension   . Morbid obesity ([place])   . Palpitations   . Reflux      Review of Systems    see HPI.  Objective:   Physical Exam  Constitutional: She appears well-developed and well-nourished.  Obese.   Pulmonary/Chest: Effort normal.  Musculoskeletal:  No tenderness over lumbar spine. Pain over buttocks, SI joint, piriformis muscles.  Lower extremity strength 5/5.  Pain with external ROM left only.           Assessment & Plan:  Marland KitchenMarland KitchenDiagnoses and all orders for this visit:  Left groin pain -     DG HIPS BILAT WITH PELVIS 3-4 VIEWS -     naproxen (NAPROSYN)  500 MG tablet; Take 1 tablet (500 mg total) by mouth 2 (two) times daily with a meal.  Morbid obesity (HCC) -     Lorcaserin HCl ER (BELVIQ XR) 20 MG TB24; Take 1 tablet by mouth daily.   Pain sounds like left hip and possible OA. Her SI/hip/buttocks seem very tight which could be transferring pain. Will get xrays. Stop mobic. Start naprosyn. May need to consider PT.weight is also effecting her joints and bones. Discussed weight loss. Will add belviq. Coupon card given. Discussed diet changes and exercises. Follow up with PCP.   Marland Kitchen.Spent 30 minutes with patient and greater than 50 percent of visit spent counseling patient regarding treatment plan.

## 2017-11-05 NOTE — Patient Instructions (Signed)

## 2017-11-06 NOTE — Progress Notes (Signed)
Call pt: arthritis in both hips. I don't necessilary think that this amt of arthritis would be causing the pain you are having. I would like to try some PT for muscles around hip. What are your thoughts?

## 2017-11-09 ENCOUNTER — Other Ambulatory Visit: Payer: Self-pay | Admitting: Family Medicine

## 2017-11-09 DIAGNOSIS — R1032 Left lower quadrant pain: Secondary | ICD-10-CM | POA: Insufficient documentation

## 2017-11-15 DIAGNOSIS — M19011 Primary osteoarthritis, right shoulder: Secondary | ICD-10-CM | POA: Diagnosis not present

## 2017-11-18 ENCOUNTER — Telehealth: Payer: Self-pay

## 2017-11-18 DIAGNOSIS — E7800 Pure hypercholesterolemia, unspecified: Secondary | ICD-10-CM

## 2017-11-18 MED ORDER — LISINOPRIL 40 MG PO TABS: 40.0000 mg | ORAL_TABLET | Freq: Every day | ORAL | 0 refills | Status: DC

## 2017-11-18 NOTE — Telephone Encounter (Signed)
Rachael Hancock called and states on a visit with Dr Dianah Field her blood pressure was elevated. She reports Dr Madilyn Fireman advised her to increase Lisinopril 2 tablets of the 20mg  tablets daily. She needs a change in the prescription. She also wants to know when she should follow up with Dr Madilyn Fireman.

## 2017-11-18 NOTE — Telephone Encounter (Signed)
Sent new Rx for 40mg  to pharmacy.  Need to check CMP and lipids

## 2017-11-19 NOTE — Telephone Encounter (Signed)
Pt advised.

## 2017-11-20 DIAGNOSIS — E7800 Pure hypercholesterolemia, unspecified: Secondary | ICD-10-CM | POA: Diagnosis not present

## 2017-11-20 NOTE — Addendum Note (Signed)
Addended by: Narda Rutherford on: 11/20/2017 12:02 PM   Modules accepted: Orders

## 2017-11-21 LAB — COMPLETE METABOLIC PANEL WITH GFR
AG RATIO: 1.5 (calc) (ref 1.0–2.5)
ALT: 14 U/L (ref 6–29)
AST: 14 U/L (ref 10–35)
Albumin: 4.1 g/dL (ref 3.6–5.1)
Alkaline phosphatase (APISO): 72 U/L (ref 33–130)
BILIRUBIN TOTAL: 0.7 mg/dL (ref 0.2–1.2)
BUN: 22 mg/dL (ref 7–25)
CHLORIDE: 105 mmol/L (ref 98–110)
CO2: 26 mmol/L (ref 20–32)
Calcium: 9 mg/dL (ref 8.6–10.4)
Creat: 0.91 mg/dL (ref 0.50–0.99)
GFR, Est African American: 79 mL/min/{1.73_m2} (ref >=60)
GFR, Est Non African American: 69 mL/min/{1.73_m2} (ref >=60)
Globulin: 2.7 g/dL (calc) (ref 1.9–3.7)
Glucose, Bld: 68 mg/dL (ref 65–99)
POTASSIUM: 4.1 mmol/L (ref 3.5–5.3)
Sodium: 141 mmol/L (ref 135–146)
Total Protein: 6.8 g/dL (ref 6.1–8.1)

## 2017-11-21 LAB — LIPID PANEL W/REFLEX DIRECT LDL
CHOL/HDL RATIO: 3.2 (calc) (ref <5.0)
Cholesterol: 194 mg/dL (ref <200)
HDL: 61 mg/dL (ref >50)
LDL Cholesterol (Calc): 119 mg/dL (calc) — ABNORMAL HIGH
NON-HDL CHOLESTEROL (CALC): 133 mg/dL — AB (ref <130)
TRIGLYCERIDES: 53 mg/dL (ref <150)

## 2017-11-26 ENCOUNTER — Other Ambulatory Visit: Payer: Self-pay | Admitting: Orthopedic Surgery

## 2017-11-26 DIAGNOSIS — M19011 Primary osteoarthritis, right shoulder: Secondary | ICD-10-CM

## 2017-11-27 ENCOUNTER — Other Ambulatory Visit: Payer: Self-pay | Admitting: Sports Medicine

## 2017-11-27 DIAGNOSIS — M7501 Adhesive capsulitis of right shoulder: Secondary | ICD-10-CM

## 2017-12-02 ENCOUNTER — Other Ambulatory Visit: Payer: Self-pay | Admitting: Orthopedic Surgery

## 2017-12-04 ENCOUNTER — Ambulatory Visit
Admission: RE | Admit: 2017-12-04 | Discharge: 2017-12-04 | Disposition: A | Discharge status: Home / Self Care | Payer: Federal, State, Local not specified - PPO | Source: Ambulatory Visit | Attending: Orthopedic Surgery | Admitting: Orthopedic Surgery

## 2017-12-04 DIAGNOSIS — M19011 Primary osteoarthritis, right shoulder: Secondary | ICD-10-CM | POA: Diagnosis not present

## 2017-12-04 DIAGNOSIS — Z01818 Encounter for other preprocedural examination: Secondary | ICD-10-CM | POA: Diagnosis not present

## 2017-12-24 ENCOUNTER — Other Ambulatory Visit: Payer: Self-pay | Admitting: Family Medicine

## 2017-12-24 ENCOUNTER — Other Ambulatory Visit: Payer: Self-pay | Admitting: Sports Medicine

## 2017-12-24 DIAGNOSIS — M7501 Adhesive capsulitis of right shoulder: Secondary | ICD-10-CM

## 2017-12-31 ENCOUNTER — Encounter (HOSPITAL_COMMUNITY): Payer: Self-pay

## 2017-12-31 NOTE — Pre-Procedure Instructions (Signed)
Rachael Hancock  12/31/2017      [id number] - [place] - [address] [place] [address] Phone: [phone] Fax: [phone]    Your procedure is scheduled on  Thursday 01/08/18  Report to [facility] Admitting at 1100 A.M.  Call this number if you have problems the morning of surgery:  [phone]   Remember:  Do not eat or drink after midnight.    Take these medicines the morning of surgery with A SIP OF WATER- AMLODIIPINE (NORVASC), OMEPRAZOLE (PRILOSEC), TRAMADOL IF NEEDED   7 days prior to surgery STOP taking any Aspirin(unless otherwise instructed by your surgeon), Aleve, MELOXICAM/ MOBIC, Naproxen, Ibuprofen, Motrin, Advil, Goody's, BC's, all herbal medications, fish oil, and all vitamins   Do not wear jewelry, make-up or nail polish.  Do not wear lotions, powders, or perfumes, or deodorant.  Do not shave 48 hours prior to surgery.  Men may shave face and neck.  Do not bring valuables to the hospital.  [facility] is not responsible for any belongings or valuables.  Contacts, dentures or bridgework may not be worn into surgery.  Leave your suitcase in the car.  After surgery it may be brought to your room.  For patients admitted to the hospital, discharge time will be determined by your treatment team.  Patients discharged the day of surgery will not be allowed to drive home.   Name and phone number of your driver:    Special instructions:  [place] - Preparing for Surgery  Before surgery, you can play an important role.  Because skin is not sterile, your skin needs to be as free of germs as possible.  You can reduce the number of germs on you skin by washing with CHG (chlorahexidine gluconate) soap before surgery.  CHG is an antiseptic cleaner which kills germs and bonds with the skin to continue killing germs even after washing.  Oral Hygiene is also important in reducing the risk of infection.   Remember to brush your teeth with your regular toothpaste the morning of surgery.  Please DO NOT use if you have an allergy to CHG or antibacterial soaps.  If your skin becomes reddened/irritated stop using the CHG and inform your nurse when you arrive at Short Stay.  Do not shave (including legs and underarms) for at least 48 hours prior to the first CHG shower.  You may shave your face.  Please follow these instructions carefully:   1.  Shower with CHG Soap the night before surgery and the morning of Surgery.  2.  If you choose to wash your hair, wash your hair first as usual with your normal shampoo.  3.  After you shampoo, rinse your hair and body thoroughly to remove the shampoo. 4.  Use CHG as you would any other liquid soap.  You can apply chg directly to the skin and wash gently with a      scrungie or washcloth.           5.  Apply the CHG Soap to your body ONLY FROM THE NECK DOWN.   Do not use on open wounds or open sores. Avoid contact with your eyes, ears, mouth and genitals (private parts).  Wash genitals (private parts) with your normal soap.  6.  Wash thoroughly, paying special attention to the area where your surgery will be performed.  7.  Thoroughly rinse your body with warm water from the neck  down.  8.  DO NOT shower/wash with your normal soap after using and rinsing off the CHG Soap.  9.  Pat yourself dry with a clean towel.            10.  Wear clean pajamas.            11.  Place clean sheets on your bed the night of your first shower and do not sleep with pets.  Day of Surgery  Do not apply any lotions/deoderants the morning of surgery.   Please wear clean clothes to the hospital/surgery center. Remember to brush your teeth with toothpaste.     Please read over the following fact sheets that you were given. MRSA Information and Surgical Site Infection Prevention

## 2018-01-01 ENCOUNTER — Other Ambulatory Visit: Payer: Self-pay

## 2018-01-01 ENCOUNTER — Ambulatory Visit (HOSPITAL_COMMUNITY)
Admission: RE | Admit: 2018-01-01 | Discharge: 2018-01-01 | Disposition: A | Discharge status: Home / Self Care | Payer: Federal, State, Local not specified - PPO | Source: Ambulatory Visit | Attending: Orthopedic Surgery | Admitting: Orthopedic Surgery

## 2018-01-01 ENCOUNTER — Encounter (HOSPITAL_COMMUNITY): Payer: Self-pay | Admitting: *Deleted

## 2018-01-01 ENCOUNTER — Encounter (HOSPITAL_COMMUNITY)
Admission: RE | Admit: 2018-01-01 | Discharge: 2018-01-01 | Disposition: A | Discharge status: Home / Self Care | Payer: Federal, State, Local not specified - PPO | Source: Ambulatory Visit | Attending: Orthopedic Surgery | Admitting: Orthopedic Surgery

## 2018-01-01 DIAGNOSIS — M7501 Adhesive capsulitis of right shoulder: Secondary | ICD-10-CM | POA: Diagnosis not present

## 2018-01-01 DIAGNOSIS — M19011 Primary osteoarthritis, right shoulder: Secondary | ICD-10-CM | POA: Insufficient documentation

## 2018-01-01 DIAGNOSIS — Z01818 Encounter for other preprocedural examination: Secondary | ICD-10-CM | POA: Insufficient documentation

## 2018-01-01 DIAGNOSIS — Z01810 Encounter for preprocedural cardiovascular examination: Secondary | ICD-10-CM | POA: Diagnosis not present

## 2018-01-01 HISTORY — DX: Unspecified osteoarthritis, unspecified site: M19.90

## 2018-01-01 HISTORY — DX: Headache: R51

## 2018-01-01 HISTORY — DX: Sleep apnea, unspecified: G47.30

## 2018-01-01 HISTORY — DX: Other complications of anesthesia, initial encounter: T88.59XA

## 2018-01-01 HISTORY — DX: Gastro-esophageal reflux disease without esophagitis: K21.9

## 2018-01-01 HISTORY — DX: Headache, unspecified: R51.9

## 2018-01-01 HISTORY — DX: Adverse effect of unspecified anesthetic, initial encounter: T41.45XA

## 2018-01-01 LAB — CBC WITH DIFFERENTIAL/PLATELET
Abs Immature Granulocytes: 0 10*3/uL (ref 0.0–0.1)
Basophils Absolute: 0.1 10*3/uL (ref 0.0–0.1)
Basophils Relative: 1 %
EOS PCT: 3 %
Eosinophils Absolute: 0.2 10*3/uL (ref 0.0–0.7)
HEMATOCRIT: 44.8 % (ref 36.0–46.0)
Hemoglobin: 14.1 g/dL (ref 12.0–15.0)
Immature Granulocytes: 0 %
LYMPHS PCT: 30 %
Lymphs Abs: 2.3 10*3/uL (ref 0.7–4.0)
MCH: 29.6 pg (ref 26.0–34.0)
MCHC: 31.5 g/dL (ref 30.0–36.0)
MCV: 94.1 fL (ref 78.0–100.0)
MONO ABS: 0.6 10*3/uL (ref 0.1–1.0)
Monocytes Relative: 7 %
NEUTROS ABS: 4.5 10*3/uL (ref 1.7–7.7)
Neutrophils Relative %: 59 %
Platelets: 248 10*3/uL (ref 150–400)
RBC: 4.76 MIL/uL (ref 3.87–5.11)
RDW: 12.4 % (ref 11.5–15.5)
WBC: 7.7 10*3/uL (ref 4.0–10.5)

## 2018-01-01 LAB — URINALYSIS, ROUTINE W REFLEX MICROSCOPIC
Bilirubin Urine: NEGATIVE
Glucose, UA: NEGATIVE mg/dL
KETONES UR: NEGATIVE mg/dL
Nitrite: POSITIVE — AB
PROTEIN: NEGATIVE mg/dL
Specific Gravity, Urine: 1.01 (ref 1.005–1.030)
pH: 5 (ref 5.0–8.0)

## 2018-01-01 LAB — COMPREHENSIVE METABOLIC PANEL
ALK PHOS: 67 U/L (ref 38–126)
ALT: 18 U/L (ref 0–44)
ANION GAP: 12 (ref 5–15)
AST: 18 U/L (ref 15–41)
Albumin: 3.9 g/dL (ref 3.5–5.0)
BILIRUBIN TOTAL: 0.9 mg/dL (ref 0.3–1.2)
BUN: 21 mg/dL — ABNORMAL HIGH (ref 6–20)
CALCIUM: 9.7 mg/dL (ref 8.9–10.3)
CO2: 20 mmol/L — ABNORMAL LOW (ref 22–32)
Chloride: 108 mmol/L (ref 98–111)
Creatinine, Ser: 0.86 mg/dL (ref 0.44–1.00)
GLUCOSE: 97 mg/dL (ref 70–99)
Potassium: 3.8 mmol/L (ref 3.5–5.1)
Sodium: 140 mmol/L (ref 135–145)
TOTAL PROTEIN: 6.7 g/dL (ref 6.5–8.1)

## 2018-01-01 LAB — APTT: APTT: 32 s (ref 24–36)

## 2018-01-01 LAB — PROTIME-INR
INR: 1.02
Prothrombin Time: 13.3 seconds (ref 11.4–15.2)

## 2018-01-01 LAB — TYPE AND SCREEN
ABO/RH(D): A POS
Antibody Screen: NEGATIVE

## 2018-01-01 LAB — ABO/RH: ABO/RH(D): A POS

## 2018-01-01 LAB — SURGICAL PCR SCREEN
MRSA, PCR: NEGATIVE
Staphylococcus aureus: NEGATIVE

## 2018-01-01 NOTE — Progress Notes (Signed)
I called and spoke to [facility] in Dr Bettina Gavia office and informed her of Urine results.

## 2018-01-01 NOTE — Progress Notes (Signed)
CALLED AND NOTIFIED DR. CHANDLER'S OFFICE OF ABNORMAL UA RESULTS.  SPOKE WITH JUDY WHO WILL SEND A MESSAGE.

## 2018-01-02 ENCOUNTER — Ambulatory Visit: Payer: Federal, State, Local not specified - PPO | Admitting: Sports Medicine

## 2018-01-02 ENCOUNTER — Encounter: Payer: Self-pay | Admitting: Sports Medicine

## 2018-01-02 DIAGNOSIS — M7501 Adhesive capsulitis of right shoulder: Secondary | ICD-10-CM | POA: Diagnosis not present

## 2018-01-02 NOTE — Progress Notes (Signed)
Subjective:    CC: Surgical clearance  HPI: This is a pleasant 62 year old female, she is having her right total shoulder arthroplasty next Thursday, here for surgical clearance, she has greater than 4 metabolic equivalents of exercise tolerance, she had a ECG that was normal, routine labs preoperative that were normal, ready to proceed.  I reviewed the past medical history, family history, social history, surgical history, and allergies today and no changes were needed.  Please see the problem list section below in epic for further details.  Past Medical History: Past Medical History:  Diagnosis Date  . Anxiety   . Arthritis   . Complication of anesthesia    HA  WITH SPINAL, WISDOM TO AWHILE TO WAKE UP WHEN YOUNGER-   . Depression   . GERD (gastroesophageal reflux disease)   . Headache    MIGRAINE  . Hypertension   . Morbid obesity ([place])   . Palpitations   . Reflux   . Sleep apnea    BORDERLINE -  UNABLE TO USE CPAP 4-5 YRS AGO   Past Surgical History: Past Surgical History:  Procedure Laterality Date  . ANKLE FRACTURE SURGERY    . cyst removed    . KIDNEY STONE SURGERY    . NOVASURE ABLATION    . WISDOM TOOTH EXTRACTION     Social History: Social History   Socioeconomic History  . Marital status: Married    Spouse name: Octavia Bruckner  . Number of children: 2  . Years of education: 3 YRS COLL  . Highest education level: Not on file  Occupational History  . Occupation: Barista SERVICE REP    Employer: SELF    Comment: [employer]  Social Needs  . Financial resource strain: Not on file  . Food insecurity:    Worry: Not on file    Inability: Not on file  . Transportation needs:    Medical: Not on file    Non-medical: Not on file  Tobacco Use  . Smoking status: Never Smoker  . Smokeless tobacco: Never Used  Substance and Sexual Activity  . Alcohol use: Yes    Alcohol/week: 0.0 oz  . Drug use: No  . Sexual activity: Not on file  Lifestyle  . Physical  activity:    Days per week: Not on file    Minutes per session: Not on file  . Stress: Not on file  Relationships  . Social connections:    Talks on phone: Not on file    Gets together: Not on file    Attends religious service: Not on file    Active member of club or organization: Not on file    Attends meetings of clubs or organizations: Not on file    Relationship status: Not on file  Other Topics Concern  . 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 Heart attack Father        at age 12  . Cancer Paternal Grandmother   . Coronary artery disease Unknown   . Arrhythmia Mother        pacemaker insertion  . Diabetes Maternal Aunt    Allergies: Allergies  Allergen Reactions  . Verapamil Other (See Comments)    Constipation.    Medications: See med rec.  Review of Systems: No fevers, chills,  night sweats, weight loss, chest pain, or shortness of breath.   Objective:    General: Well Developed, well nourished, and in no acute distress.  Neuro: Alert and oriented x3, extra-ocular muscles intact, sensation grossly intact.  HEENT: Normocephalic, atraumatic, pupils equal round reactive to light, neck supple, no masses, no lymphadenopathy, thyroid nonpalpable.  Skin: Warm and dry, no rashes. Cardiac: Regular rate and rhythm, no murmurs rubs or gallops, no lower extremity edema.  Respiratory: Clear to auscultation bilaterally. Not using accessory muscles, speaking in full sentences.  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 Greater than 4 metabolic equivalents of exercise tolerance, normal EKG, normal preoperative labs. Cleared for surgery, return as needed.  I spent 25 minutes with this patient, greater  than 50% was face-to-face time counseling regarding the above diagnoses ___________________________________________ Gwen Her. Dianah Field, M.D., ABFM., CAQSM. Primary Care and [place] Instructor of [place] [facility]

## 2018-01-02 NOTE — Assessment & Plan Note (Signed)
Total shoulder arthroplasty scheduled for next Thursday. Greater than 4 metabolic equivalents of exercise tolerance, normal EKG, normal preoperative labs. Cleared for surgery, return as needed.

## 2018-01-07 MED ORDER — CEFAZOLIN SODIUM 10 G IJ SOLR
3.0000 g | INTRAMUSCULAR | Status: AC
  Administered 2018-01-08: 3 g via INTRAVENOUS
  Filled 2018-01-07: qty 3

## 2018-01-07 MED ORDER — TRANEXAMIC ACID 1000 MG/10ML IV SOLN
1000.0000 mg | INTRAVENOUS | Status: AC
  Administered 2018-01-08: 1000 mg via INTRAVENOUS
  Filled 2018-01-07: qty 1100

## 2018-01-08 ENCOUNTER — Inpatient Hospital Stay (HOSPITAL_COMMUNITY): Payer: Federal, State, Local not specified - PPO

## 2018-01-08 ENCOUNTER — Inpatient Hospital Stay (HOSPITAL_COMMUNITY): Payer: Federal, State, Local not specified - PPO | Admitting: Anesthesiology

## 2018-01-08 ENCOUNTER — Encounter (HOSPITAL_COMMUNITY)
Admission: RE | Disposition: A | Discharge status: Home / Self Care | Payer: Self-pay | Source: Home / Self Care | Attending: Orthopedic Surgery

## 2018-01-08 ENCOUNTER — Encounter (HOSPITAL_COMMUNITY): Payer: Self-pay | Admitting: Anesthesiology

## 2018-01-08 ENCOUNTER — Other Ambulatory Visit: Payer: Self-pay

## 2018-01-08 ENCOUNTER — Inpatient Hospital Stay (HOSPITAL_COMMUNITY)
Admission: RE | Admit: 2018-01-08 | Discharge: 2018-01-09 | DRG: 483 | Disposition: A | Discharge status: Home / Self Care | Payer: Federal, State, Local not specified - PPO | Attending: Orthopedic Surgery | Admitting: Orthopedic Surgery

## 2018-01-08 DIAGNOSIS — Z791 Long term (current) use of non-steroidal anti-inflammatories (NSAID): Secondary | ICD-10-CM | POA: Diagnosis not present

## 2018-01-08 DIAGNOSIS — K219 Gastro-esophageal reflux disease without esophagitis: Secondary | ICD-10-CM | POA: Diagnosis not present

## 2018-01-08 DIAGNOSIS — Z8249 Family history of ischemic heart disease and other diseases of the circulatory system: Secondary | ICD-10-CM

## 2018-01-08 DIAGNOSIS — Z96611 Presence of right artificial shoulder joint: Secondary | ICD-10-CM | POA: Diagnosis not present

## 2018-01-08 DIAGNOSIS — M19012 Primary osteoarthritis, left shoulder: Secondary | ICD-10-CM

## 2018-01-08 DIAGNOSIS — M19011 Primary osteoarthritis, right shoulder: Principal | ICD-10-CM | POA: Diagnosis present

## 2018-01-08 DIAGNOSIS — I10 Essential (primary) hypertension: Secondary | ICD-10-CM | POA: Diagnosis present

## 2018-01-08 DIAGNOSIS — G8918 Other acute postprocedural pain: Secondary | ICD-10-CM | POA: Diagnosis not present

## 2018-01-08 DIAGNOSIS — Z471 Aftercare following joint replacement surgery: Secondary | ICD-10-CM | POA: Diagnosis not present

## 2018-01-08 HISTORY — PX: TOTAL SHOULDER ARTHROPLASTY: SHX126

## 2018-01-08 SURGERY — ARTHROPLASTY, SHOULDER, TOTAL
Anesthesia: General | Site: Shoulder | Laterality: Right

## 2018-01-08 MED ORDER — DEXAMETHASONE SODIUM PHOSPHATE 10 MG/ML IJ SOLN
INTRAMUSCULAR | Status: DC | PRN
  Administered 2018-01-08: 10 mg via INTRAVENOUS

## 2018-01-08 MED ORDER — PROPOFOL 10 MG/ML IV BOLUS
INTRAVENOUS | Status: DC | PRN
  Administered 2018-01-08: 180 mg via INTRAVENOUS
  Administered 2018-01-08: 30 mg via INTRAVENOUS
  Administered 2018-01-08: 10 mg via INTRAVENOUS

## 2018-01-08 MED ORDER — SODIUM CHLORIDE 0.9 % IR SOLN
Status: DC | PRN
  Administered 2018-01-08: 3000 mL

## 2018-01-08 MED ORDER — OXYCODONE HCL 5 MG PO TABS: 5.0000 mg | ORAL_TABLET | ORAL | Status: DC | PRN

## 2018-01-08 MED ORDER — LACTATED RINGERS IV SOLN: INTRAVENOUS | Status: DC

## 2018-01-08 MED ORDER — METOCLOPRAMIDE HCL 5 MG PO TABS: 5.0000 mg | ORAL_TABLET | Freq: Three times a day (TID) | ORAL | Status: DC | PRN

## 2018-01-08 MED ORDER — SCOPOLAMINE 1 MG/3DAYS TD PT72
MEDICATED_PATCH | TRANSDERMAL | Status: DC | PRN
  Administered 2018-01-08: 1 via TRANSDERMAL

## 2018-01-08 MED ORDER — POVIDONE-IODINE 7.5 % EX SOLN
Freq: Once | CUTANEOUS | Status: DC
  Filled 2018-01-08: qty 118

## 2018-01-08 MED ORDER — PHENYLEPHRINE HCL 10 MG/ML IJ SOLN
INTRAMUSCULAR | Status: DC | PRN
  Administered 2018-01-08: 25 ug/min via INTRAVENOUS

## 2018-01-08 MED ORDER — FENTANYL CITRATE (PF) 250 MCG/5ML IJ SOLN
INTRAMUSCULAR | Status: AC
  Filled 2018-01-08: qty 5

## 2018-01-08 MED ORDER — METOCLOPRAMIDE HCL 5 MG/ML IJ SOLN: 5.0000 mg | Freq: Three times a day (TID) | INTRAMUSCULAR | Status: DC | PRN

## 2018-01-08 MED ORDER — FENTANYL CITRATE (PF) 100 MCG/2ML IJ SOLN
INTRAMUSCULAR | Status: DC | PRN
  Administered 2018-01-08: 50 ug via INTRAVENOUS

## 2018-01-08 MED ORDER — PHENOL 1.4 % MT LIQD: 1.0000 | OROMUCOSAL | Status: DC | PRN

## 2018-01-08 MED ORDER — LISINOPRIL 40 MG PO TABS
40.0000 mg | ORAL_TABLET | Freq: Every day | ORAL | Status: DC
  Administered 2018-01-08 – 2018-01-09 (×2): 40 mg via ORAL
  Filled 2018-01-08 (×2): qty 1

## 2018-01-08 MED ORDER — DIPHENHYDRAMINE HCL 12.5 MG/5ML PO ELIX
12.5000 mg | ORAL_SOLUTION | ORAL | Status: DC | PRN
  Administered 2018-01-08: 25 mg via ORAL
  Filled 2018-01-08: qty 10

## 2018-01-08 MED ORDER — ONDANSETRON HCL 4 MG/2ML IJ SOLN
INTRAMUSCULAR | Status: DC | PRN
  Administered 2018-01-08: 4 mg via INTRAVENOUS

## 2018-01-08 MED ORDER — SENNOSIDES-DOCUSATE SODIUM 8.6-50 MG PO TABS: 1.0000 | ORAL_TABLET | Freq: Every evening | ORAL | Status: DC | PRN

## 2018-01-08 MED ORDER — DOCUSATE SODIUM 100 MG PO CAPS
100.0000 mg | ORAL_CAPSULE | Freq: Two times a day (BID) | ORAL | Status: DC
  Administered 2018-01-08 – 2018-01-09 (×2): 100 mg via ORAL
  Filled 2018-01-08 (×2): qty 1

## 2018-01-08 MED ORDER — MEPERIDINE HCL 50 MG/ML IJ SOLN: 6.2500 mg | INTRAMUSCULAR | Status: DC | PRN

## 2018-01-08 MED ORDER — AMLODIPINE BESYLATE 5 MG PO TABS
5.0000 mg | ORAL_TABLET | Freq: Every day | ORAL | Status: DC
  Administered 2018-01-08 – 2018-01-09 (×2): 5 mg via ORAL
  Filled 2018-01-08 (×2): qty 1

## 2018-01-08 MED ORDER — DOCUSATE SODIUM 100 MG PO CAPS: 100.0000 mg | ORAL_CAPSULE | Freq: Three times a day (TID) | ORAL | 0 refills | Status: DC | PRN

## 2018-01-08 MED ORDER — FENTANYL CITRATE (PF) 100 MCG/2ML IJ SOLN
100.0000 ug | Freq: Once | INTRAMUSCULAR | Status: AC
  Administered 2018-01-08: 100 ug via INTRAVENOUS
  Filled 2018-01-08: qty 2

## 2018-01-08 MED ORDER — CEFAZOLIN SODIUM-DEXTROSE 2-4 GM/100ML-% IV SOLN
2.0000 g | Freq: Four times a day (QID) | INTRAVENOUS | Status: AC
  Administered 2018-01-08 – 2018-01-09 (×3): 2 g via INTRAVENOUS
  Filled 2018-01-08 (×3): qty 100

## 2018-01-08 MED ORDER — ASPIRIN EC 81 MG PO TBEC
81.0000 mg | DELAYED_RELEASE_TABLET | Freq: Two times a day (BID) | ORAL | Status: DC
  Administered 2018-01-08 – 2018-01-09 (×2): 81 mg via ORAL
  Filled 2018-01-08 (×2): qty 1

## 2018-01-08 MED ORDER — BUPIVACAINE LIPOSOME 1.3 % IJ SUSP
INTRAMUSCULAR | Status: DC | PRN
  Administered 2018-01-08: 10 mL via PERINEURAL

## 2018-01-08 MED ORDER — METHOCARBAMOL 500 MG PO TABS
500.0000 mg | ORAL_TABLET | Freq: Four times a day (QID) | ORAL | Status: DC | PRN
  Administered 2018-01-08: 500 mg via ORAL
  Filled 2018-01-08: qty 1

## 2018-01-08 MED ORDER — PROPOFOL 10 MG/ML IV BOLUS
INTRAVENOUS | Status: AC
  Filled 2018-01-08: qty 20

## 2018-01-08 MED ORDER — BISACODYL 5 MG PO TBEC: 5.0000 mg | DELAYED_RELEASE_TABLET | Freq: Every day | ORAL | Status: DC | PRN

## 2018-01-08 MED ORDER — HYDROMORPHONE HCL 1 MG/ML IJ SOLN
0.2500 mg | INTRAMUSCULAR | Status: DC | PRN
  Administered 2018-01-08: 0.5 mg via INTRAVENOUS
  Administered 2018-01-08: 0.25 mg via INTRAVENOUS

## 2018-01-08 MED ORDER — SUGAMMADEX SODIUM 500 MG/5ML IV SOLN
INTRAVENOUS | Status: AC
  Filled 2018-01-08: qty 5

## 2018-01-08 MED ORDER — PROMETHAZINE HCL 25 MG/ML IJ SOLN: 6.2500 mg | INTRAMUSCULAR | Status: DC | PRN

## 2018-01-08 MED ORDER — ACETAMINOPHEN 325 MG PO TABS: 325.0000 mg | ORAL_TABLET | Freq: Four times a day (QID) | ORAL | Status: DC | PRN

## 2018-01-08 MED ORDER — ZOLPIDEM TARTRATE 5 MG PO TABS: 5.0000 mg | ORAL_TABLET | Freq: Every evening | ORAL | Status: DC | PRN

## 2018-01-08 MED ORDER — MIDAZOLAM HCL 2 MG/2ML IJ SOLN
2.0000 mg | Freq: Once | INTRAMUSCULAR | Status: AC
  Administered 2018-01-08: 1 mg via INTRAVENOUS
  Filled 2018-01-08: qty 2

## 2018-01-08 MED ORDER — ONDANSETRON HCL 4 MG PO TABS: 4.0000 mg | ORAL_TABLET | Freq: Four times a day (QID) | ORAL | Status: DC | PRN

## 2018-01-08 MED ORDER — SODIUM CHLORIDE 0.9 % IV SOLN
INTRAVENOUS | Status: DC
  Administered 2018-01-08: 18:00:00 via INTRAVENOUS

## 2018-01-08 MED ORDER — ONDANSETRON HCL 4 MG/2ML IJ SOLN: 4.0000 mg | Freq: Four times a day (QID) | INTRAMUSCULAR | Status: DC | PRN

## 2018-01-08 MED ORDER — HYDROMORPHONE HCL 1 MG/ML IJ SOLN: 0.5000 mg | INTRAMUSCULAR | Status: DC | PRN

## 2018-01-08 MED ORDER — PANTOPRAZOLE SODIUM 40 MG PO TBEC
40.0000 mg | DELAYED_RELEASE_TABLET | Freq: Every day | ORAL | Status: DC
  Administered 2018-01-08 – 2018-01-09 (×2): 40 mg via ORAL
  Filled 2018-01-08 (×2): qty 1

## 2018-01-08 MED ORDER — ACETAMINOPHEN 500 MG PO TABS
1000.0000 mg | ORAL_TABLET | Freq: Four times a day (QID) | ORAL | Status: DC
  Administered 2018-01-08 – 2018-01-09 (×3): 1000 mg via ORAL
  Filled 2018-01-08 (×3): qty 2

## 2018-01-08 MED ORDER — HYDROMORPHONE HCL 1 MG/ML IJ SOLN
INTRAMUSCULAR | Status: AC
  Filled 2018-01-08: qty 1

## 2018-01-08 MED ORDER — OXYCODONE HCL 5 MG PO TABS
10.0000 mg | ORAL_TABLET | ORAL | Status: DC | PRN
  Administered 2018-01-08: 10 mg via ORAL
  Filled 2018-01-08: qty 2

## 2018-01-08 MED ORDER — MENTHOL 3 MG MT LOZG: 1.0000 | LOZENGE | OROMUCOSAL | Status: DC | PRN

## 2018-01-08 MED ORDER — SUGAMMADEX SODIUM 500 MG/5ML IV SOLN
INTRAVENOUS | Status: DC | PRN
  Administered 2018-01-08: 300 mg via INTRAVENOUS

## 2018-01-08 MED ORDER — ALUMINUM HYDROXIDE GEL 320 MG/5ML PO SUSP
15.0000 mL | ORAL | Status: DC | PRN
  Filled 2018-01-08: qty 30

## 2018-01-08 MED ORDER — 0.9 % SODIUM CHLORIDE (POUR BTL) OPTIME
TOPICAL | Status: DC | PRN
  Administered 2018-01-08: 1000 mL

## 2018-01-08 MED ORDER — ROCURONIUM BROMIDE 10 MG/ML (PF) SYRINGE
PREFILLED_SYRINGE | INTRAVENOUS | Status: DC | PRN
  Administered 2018-01-08: 70 mg via INTRAVENOUS
  Administered 2018-01-08: 20 mg via INTRAVENOUS

## 2018-01-08 MED ORDER — OXYCODONE-ACETAMINOPHEN 5-325 MG PO TABS: 1.0000 | ORAL_TABLET | ORAL | 0 refills | Status: DC | PRN

## 2018-01-08 MED ORDER — METHOCARBAMOL 1000 MG/10ML IJ SOLN
500.0000 mg | Freq: Four times a day (QID) | INTRAVENOUS | Status: DC | PRN
  Filled 2018-01-08: qty 5

## 2018-01-08 MED ORDER — METHOCARBAMOL 500 MG PO TABS: 500.0000 mg | ORAL_TABLET | Freq: Three times a day (TID) | ORAL | 0 refills | Status: DC

## 2018-01-08 MED ORDER — LACTATED RINGERS IV SOLN
INTRAVENOUS | Status: DC
  Administered 2018-01-08 (×2): via INTRAVENOUS

## 2018-01-08 MED ORDER — BUPIVACAINE HCL (PF) 0.5 % IJ SOLN
INTRAMUSCULAR | Status: DC | PRN
  Administered 2018-01-08: 15 mL via PERINEURAL

## 2018-01-08 SURGICAL SUPPLY — 71 items
AID PSTN UNV HD RSTRNT DISP (MISCELLANEOUS) ×1
BIT DRILL 5/64X5 DISP (BIT) ×2 IMPLANT
BLADE SAW SAG 73X25 THK (BLADE) ×1
BLADE SAW SGTL 73X25 THK (BLADE) ×1 IMPLANT
BLADE SURG 15 STRL LF DISP TIS (BLADE) ×1 IMPLANT
BLADE SURG 15 STRL SS (BLADE) ×2
CEMENT BONE DEPUY (Cement) ×1 IMPLANT
CHLORAPREP W/TINT 26ML (MISCELLANEOUS) ×2 IMPLANT
CLSR STERI-STRIP ANTIMIC 1/2X4 (GAUZE/BANDAGES/DRESSINGS) ×1 IMPLANT
COVER SURGICAL LIGHT HANDLE (MISCELLANEOUS) ×2 IMPLANT
DRAPE INCISE IOBAN 66X45 STRL (DRAPES) ×2 IMPLANT
DRAPE ORTHO SPLIT 77X108 STRL (DRAPES) ×4
DRAPE SURG 17X23 STRL (DRAPES) ×2 IMPLANT
DRAPE SURG ORHT 6 SPLT 77X108 (DRAPES) ×2 IMPLANT
DRAPE U-SHAPE 47X51 STRL (DRAPES) ×2 IMPLANT
DRSG AQUACEL AG ADV 3.5X 6 (GAUZE/BANDAGES/DRESSINGS) ×1 IMPLANT
DRSG AQUACEL AG ADV 3.5X10 (GAUZE/BANDAGES/DRESSINGS) IMPLANT
ELECT BLADE 4.0 EZ CLEAN MEGAD (MISCELLANEOUS)
ELECT REM PT RETURN 9FT ADLT (ELECTROSURGICAL) ×2
ELECTRODE BLDE 4.0 EZ CLN MEGD (MISCELLANEOUS) IMPLANT
ELECTRODE REM PT RTRN 9FT ADLT (ELECTROSURGICAL) ×1 IMPLANT
GLENOID PEG SHOULDER 40MM SML (Shoulder) IMPLANT
GLOVE BIO SURGEON STRL SZ7 (GLOVE) ×2 IMPLANT
GLOVE BIO SURGEON STRL SZ7.5 (GLOVE) ×2 IMPLANT
GLOVE BIOGEL PI IND STRL 7.0 (GLOVE) ×1 IMPLANT
GLOVE BIOGEL PI IND STRL 8 (GLOVE) ×1 IMPLANT
GLOVE BIOGEL PI INDICATOR 7.0 (GLOVE) ×1
GLOVE BIOGEL PI INDICATOR 8 (GLOVE) ×1
GOWN STRL REUS W/ TWL LRG LVL3 (GOWN DISPOSABLE) ×1 IMPLANT
GOWN STRL REUS W/ TWL XL LVL3 (GOWN DISPOSABLE) ×1 IMPLANT
GOWN STRL REUS W/TWL LRG LVL3 (GOWN DISPOSABLE) ×2
GOWN STRL REUS W/TWL XL LVL3 (GOWN DISPOSABLE) ×2
GUIDEWIRE GLENOID 2.5X220 (WIRE) ×1 IMPLANT
HANDPIECE INTERPULSE COAX TIP (DISPOSABLE) ×2
HEAD HUMERAL AEQUALIS 48X18 (Head) ×2 IMPLANT
HEAD HUMERAL HIGH OS 48X18 (Head) IMPLANT
HEMOSTAT SURGICEL 2X14 (HEMOSTASIS) ×2 IMPLANT
HOOD PEEL AWAY FLYTE STAYCOOL (MISCELLANEOUS) ×4 IMPLANT
KIT BASIN OR (CUSTOM PROCEDURE TRAY) ×2 IMPLANT
KIT TURNOVER KIT B (KITS) ×2 IMPLANT
MANIFOLD NEPTUNE II (INSTRUMENTS) ×2 IMPLANT
NDL MAYO TROCAR (NEEDLE) ×1 IMPLANT
NEEDLE MAYO TROCAR (NEEDLE) ×2 IMPLANT
NS IRRIG 1000ML POUR BTL (IV SOLUTION) ×2 IMPLANT
PACK SHOULDER (CUSTOM PROCEDURE TRAY) ×2 IMPLANT
PAD ARMBOARD 7.5X6 YLW CONV (MISCELLANEOUS) ×4 IMPLANT
RESTRAINT HEAD UNIVERSAL NS (MISCELLANEOUS) ×2 IMPLANT
RETRIEVER SUT HEWSON (MISCELLANEOUS) ×2 IMPLANT
SET HNDPC FAN SPRY TIP SCT (DISPOSABLE) ×1 IMPLANT
SHOULDER GLENOID PEG 40MM SML (Shoulder) ×2 IMPLANT
SLING ARM FOAM STRAP LRG (SOFTGOODS) ×2 IMPLANT
SLING ARM FOAM STRAP MED (SOFTGOODS) IMPLANT
SMARTMIX MINI TOWER (MISCELLANEOUS) ×2
SPONGE LAP 18X18 X RAY DECT (DISPOSABLE) ×2 IMPLANT
SPONGE LAP 4X18 RFD (DISPOSABLE) IMPLANT
STEM HUMERAL FLEX 4C (Stem) ×1 IMPLANT
STRIP CLOSURE SKIN 1/2X4 (GAUZE/BANDAGES/DRESSINGS) ×2 IMPLANT
SUCTION FRAZIER HANDLE 10FR (MISCELLANEOUS) ×1
SUCTION TUBE FRAZIER 10FR DISP (MISCELLANEOUS) ×1 IMPLANT
SUPPORT WRAP ARM LG (MISCELLANEOUS) ×2 IMPLANT
SUT ETHIBOND NAB CT1 #1 30IN (SUTURE) ×6 IMPLANT
SUT FIBERWIRE #2 38 T-5 BLUE (SUTURE)
SUT MNCRL AB 4-0 PS2 18 (SUTURE) ×2 IMPLANT
SUT VIC AB 2-0 CT1 27 (SUTURE) ×2
SUT VIC AB 2-0 CT1 TAPERPNT 27 (SUTURE) ×1 IMPLANT
SUTURE FIBERWR #2 38 T-5 BLUE (SUTURE) IMPLANT
TAPE LABRALWHITE 1.5X36 (TAPE) ×2 IMPLANT
TAPE SUT LABRALTAP WHT/BLK (SUTURE) ×2 IMPLANT
TOWEL OR 17X26 10 PK STRL BLUE (TOWEL DISPOSABLE) ×2 IMPLANT
TOWER SMARTMIX MINI (MISCELLANEOUS) ×1 IMPLANT
TUBE CONNECTING 12X1/4 (SUCTIONS) ×1 IMPLANT

## 2018-01-08 NOTE — Op Note (Signed)
Procedure(s): RIGHT TOTAL SHOULDER ARTHROPLASTY Procedure Note  Rachael Hancock female 62 y.o. 01/08/2018  Procedure(s) and Anesthesia Type:    * RIGHT TOTAL SHOULDER ARTHROPLASTY - General  Surgeon(s) and Role:    Tania Ade, MD - Primary   Indications:  62 y.o. female  With endstage right shoulder arthritis. Pain and dysfunction interfered with quality of life and nonoperative treatment with activity modification, NSAIDS and injections failed.     Surgeon: Isabella Stalling   Assistants: Jeanmarie Hubert PA-C [facility] was present and scrubbed throughout the procedure and was essential in positioning, retraction, exposure, and closure)  Anesthesia: General endotracheal anesthesia with preoperative interscalene block given by attending anesthesiologist    Procedure Detail  RIGHT TOTAL SHOULDER ARTHROPLASTY  Findings: Tornier flex anatomic press-fit size 4 stem with a 48 head, cemented size 40s Cortiloc glenoid.   A lesser tuberosity osteotomy was performed and repaired at the conclusion of the procedure.  Estimated Blood Loss:  200 mL         Drains: None   Blood Given: none          Specimens: none        Complications:  * No complications entered in OR log *         Disposition: PACU - hemodynamically stable.         Condition: stable    Procedure:   The patient was identified in the preoperative holding area where I personally marked the operative extremity after verifying with the patient and consent. She  was taken to the operating room where She was transferred to the   operative table.  The patient received an interscalene block in   the holding area by the attending anesthesiologist.  General anesthesia was induced   in the operating room without complication.  The patient did receive IV  Ancef prior to the commencement of the procedure.  The patient was   placed in the beach-chair position with the back raised about 30   degrees.  The  nonoperative extremity and head and neck were carefully   positioned and padded protecting against neurovascular compromise.  The   left upper extremity was then prepped and draped in the standard sterile   fashion.    The appropriate operative time-out was performed with   Anesthesia, the perioperative staff, as well as myself and we all agreed   that the right side was the correct operative site. The patient received 1 g IV tranexamic acid at the start of the case around time of the incision.  An approximately   10 cm incision was made from the tip of the coracoid to the center point of the   humerus at the level of the axilla.  Dissection was carried down sharply   through subcutaneous tissues and cephalic vein was identified and taken   laterally with the deltoid.  The pectoralis major was taken medially.  The   upper 1 cm of the pectoralis major was released from its attachment on   the humerus.  The clavipectoral fascia was incised just lateral to the   conjoined tendon.  This incision was carried up to but not into the   coracoacromial ligament.  Digital palpation was used to prove   integrity of the axillary nerve which was protected throughout the   procedure.  Musculocutaneous nerve was not palpated in the operative   field.  Conjoined tendon was then retracted gently medially and the   deltoid laterally.  Anterior circumflex humeral vessels were clamped and   coagulated.  The soft tissues overlying the biceps was incised and this   incision was carried across the transverse humeral ligament to the base   of the coracoid.  The biceps was noted to be severely degenerated. It was released from the superior labrum.   An osteotomy was performed at the lesser tuberosity.  The capsule was then   released all the way down to the 6 o'clock position of the humeral head.   The humeral head was then delivered with simultaneous adduction,   extension and external rotation.  All humeral  osteophytes were removed   and the anatomic neck of the humerus was marked and cut free hand at   approximately 25 degrees retroversion within about 3 mm of the cuff   reflection posteriorly.  The head size was estimated to be a 48 medium   offset.  At that point, the humeral head was retracted posteriorly with   a Fukuda retractor.   Remaining portion of the capsule was released at the base of the   coracoid.  The remaining biceps anchor and the entire anterior-inferior   labrum was excised.  The posterior labrum was also excised but the   posterior capsule was not released.  The guidepin was placed bicortically with non elevated guide.  The reamer was used to ream to concentric bone with punctate bleeding.  This gave an excellent concentric surface.  The center hole was then drilled for an anchor peg glenoid followed by the three peripheral holes and none of the holes   exited the glenoid wall.  I then pulse irrigated these holes and dried   them with Surgicel.  The three peripheral holes were then   pressurized cemented and the anchor peg glenoid was placed and impacted   with an excellent fit.  The glenoid was a 40s component.  The proximal humerus was then again exposed taking care not to displace the glenoid.    The entry awl was used followed by sounding reamers and then sequentially broached from size 1-3. This was then left in place and the calcar planer was used. Trial head was placed with a 48.  With the trial implantation of the component,  there was approximately 50% posterior translation with immediate snap back to the   anatomic position.  With forward elevation, there was no tendency   towards posterior subluxation.   The trial was removed and the final implant was prepared on a back table.  The trial was removed and the final implant was prepared on a back table.   3 small holes were drilled on the medial side of the lesser tuberosity osteotomy, through which 2 labral tapes were  passed. The implant was then placed through the loop of the 2 labral tapes and impacted with an excellent press-fit. This achieved excellent anatomic reconstruction of the proximal humerus.  The joint was then copiously irrigated with pulse lavage.  The subscapularis and   lesser tuberosity osteotomy were then repaired using the 2 labral tapes previously passed in a double row fashion with horizontal mattress sutures medially brought over through bone tunnels tied over a bone bridge laterally.   One #1 Ethibond was placed at the rotator interval just above   the lesser tuberosity. Copious irrigation was used. Skin was closed with 2-0 Vicryl sutures in the deep dermal layer and 4-0 Monocryl in a subcuticular  running fashion.  Sterile dressings were then applied including  Aquacel.  The patient was placed in a sling and allowed to awaken from general anesthesia and taken to the recovery room in stable condition.      POSTOPERATIVE PLAN:  Early passive range of motion will be allowed with the goal of 0 degrees external rotation and 90 degrees forward elevation.  No internal rotation at this time.  No active motion of the arm until the lesser tuberosity heals.  The patient will likely be kept in the hospital for 1-2 days and then discharged home.

## 2018-01-08 NOTE — H&P (Signed)
Rachael Hancock is an 62 y.o. female.   Chief Complaint: Right shoulder pain and dysfunction HPI: Endstage R shoulder arthritis with significant pain and dysfunction, failed conservative measures.  Pain interferes with sleep and quality of life.   Past Medical History:  Diagnosis Date  . Anxiety   . Arthritis   . Complication of anesthesia    HA  WITH SPINAL, WISDOM TO AWHILE TO WAKE UP WHEN YOUNGER-   . Depression   . GERD (gastroesophageal reflux disease)   . Headache    MIGRAINE  . Hypertension   . Morbid obesity ([place])   . Palpitations   . Reflux   . Sleep apnea    BORDERLINE -  UNABLE TO USE CPAP 4-5 YRS AGO    Past Surgical History:  Procedure Laterality Date  . ANKLE FRACTURE SURGERY    . cyst removed    . KIDNEY STONE SURGERY    . NOVASURE ABLATION    . WISDOM TOOTH EXTRACTION      Family History  Problem Relation Age of Onset  . Heart attack Father        at age 26  . Cancer Paternal Grandmother   . Coronary artery disease Unknown   . Arrhythmia Mother        pacemaker insertion  . Diabetes Maternal Aunt    Social History:  reports that she has never smoked. She has never used smokeless tobacco. She reports that she drinks alcohol. She reports that she does not use drugs.  Allergies:  Allergies  Allergen Reactions  . Verapamil Other (See Comments)    Constipation.     Medications Prior to Admission  Medication Sig Dispense Refill  . amLODipine (NORVASC) 5 MG tablet TAKE 1 TABLET (5 MG TOTAL) BY MOUTH DAILY. 90 tablet 0  . bisacodyl (DULCOLAX) 5 MG EC tablet Take 5 mg by mouth daily as needed for moderate constipation.    . hydrocortisone (ANUSOL-HC) 2.5 % rectal cream Place rectally 2 (two) times daily. (Patient taking differently: Place 1 application rectally 2 (two) times daily as needed for hemorrhoids or anal itching. ) 60 g PRN  . hydrocortisone cream 1 % Apply 1 application topically 2 (two) times daily as needed for itching.    Marland Kitchen  lisinopril (PRINIVIL,ZESTRIL) 20 MG tablet TAKE 1 TABLET (20 MG TOTAL) BY MOUTH DAILY. PLEASE SCHEDULE AN APPOINTMENT FOR ADDITIONAL REFILLS (Patient taking differently: Take 40 mg by mouth daily. ) 90 tablet 0  . loratadine (CLARITIN) 10 MG tablet Take 10 mg by mouth daily.    . meloxicam (MOBIC) 15 MG tablet Take 15 mg by mouth daily.    . MULTIPLE MINERALS-VITAMINS PO Take 1 tablet by mouth 3 (three) times a week.     Marland Kitchen omeprazole (PRILOSEC) 20 MG capsule Take 20 mg by mouth daily.     . Tetrahydrozoline HCl (VISINE OP) Place 2 drops into both eyes daily as needed (for dry eyes).    . traMADol (ULTRAM) 50 MG tablet Take 50 mg by mouth daily as needed for moderate pain.    . hydrochlorothiazide (MICROZIDE) 12.5 MG capsule TAKE 1 CAPSULE (12.5 MG TOTAL) BY MOUTH DAILY. 90 capsule 1  . Lorcaserin HCl ER (BELVIQ XR) 20 MG TB24 Take 1 tablet by mouth daily. 30 tablet 1  . metoprolol succinate (TOPROL-XL) 100 MG 24 hr tablet TAKE 1 TABLET (100 MG TOTAL) BY MOUTH DAILY. TAKE WITH OR IMMEDIATELY FOLLOWING A MEAL. 90 tablet 1  . naproxen (NAPROSYN) 500  MG tablet Take 1 tablet (500 mg total) by mouth 2 (two) times daily with a meal. 60 tablet 5  . triamcinolone ointment (KENALOG) 0.5 % Apply 1 application topically 2 (two) times daily. 30 g 0    No results found for this or any previous visit (from the past 48 hour(s)). No results found.  Review of Systems  All other systems reviewed and are negative.   Blood pressure (!) 141/59, pulse 86, temperature 98.7 F (37.1 C), temperature source Oral, resp. rate 20, height 5\' 8"  (1.727 m), weight 129.3 kg (285 lb), SpO2 97 %. Physical Exam  Constitutional: She is oriented to person, place, and time. She appears well-developed and well-nourished.  HENT:  Head: Atraumatic.  Eyes: EOM are normal.  Cardiovascular: Intact distal pulses.  Respiratory: Effort normal.  Musculoskeletal:  R shoulder pain with limited ROM. NVID  Neurological: She is alert and  oriented to person, place, and time.  Skin: Skin is warm and dry.  Psychiatric: She has a normal mood and affect.     Assessment/Plan Endstage R shoulder arthritis with significant pain and dysfunction, failed conservative measures.  Pain interferes with sleep and quality of life. Plan R TSA Risks / benefits of surgery discussed Consent on chart  NPO for OR Preop antibiotics    Isabella Stalling, MD 01/08/2018, 12:29 PM

## 2018-01-08 NOTE — Anesthesia Preprocedure Evaluation (Addendum)
Anesthesia Evaluation  Patient identified by MRN, date of birth, ID band Patient awake    Reviewed: Allergy & Precautions, Patient's Chart, lab work & pertinent test results  Airway Mallampati: II  TM Distance: >3 FB Neck ROM: Full    Dental  (+) Teeth Intact, Dental Advisory Given   Pulmonary sleep apnea and Continuous Positive Airway Pressure Ventilation ,    breath sounds clear to auscultation       Cardiovascular hypertension, Pt. on medications and Pt. on home beta blockers  Rhythm:Regular Rate:Normal     Neuro/Psych  Headaches, PSYCHIATRIC DISORDERS Anxiety Depression    GI/Hepatic Neg liver ROS, GERD  Medicated,  Endo/Other  negative endocrine ROS  Renal/GU negative Renal ROS     Musculoskeletal  (+) Arthritis ,   Abdominal (+) + obese,   Peds  Hematology negative hematology ROS (+)   Anesthesia Other Findings   Reproductive/Obstetrics                           Anesthesia Physical Anesthesia Plan  ASA: III  Anesthesia Plan: General   Post-op Pain Management: GA combined w/ Regional for post-op pain   Induction: Intravenous  PONV Risk Score and Plan: 4 or greater and Ondansetron, Dexamethasone, Midazolam and Scopolamine patch - Pre-op  Airway Management Planned: Oral ETT  Additional Equipment: None  Intra-op Plan:   Post-operative Plan: Extubation in OR  Informed Consent: I have reviewed the patients History and Physical, chart, labs and discussed the procedure including the risks, benefits and alternatives for the proposed anesthesia with the patient or authorized representative who has indicated his/her understanding and acceptance.   Dental advisory given  Plan Discussed with: CRNA  Anesthesia Plan Comments:        Lab Results  Component Value Date   WBC 7.7 01/01/2018   HGB 14.1 01/01/2018   HCT 44.8 01/01/2018   MCV 94.1 01/01/2018   PLT 248 01/01/2018    Lab Results  Component Value Date   CREATININE 0.86 01/01/2018   BUN 21 (H) 01/01/2018   NA 140 01/01/2018   K 3.8 01/01/2018   CL 108 01/01/2018   CO2 20 (L) 01/01/2018   Lab Results  Component Value Date   INR 1.02 01/01/2018   EKG: normal sinus rhythm.  Anesthesia Quick Evaluation

## 2018-01-08 NOTE — Transfer of Care (Signed)
Immediate Anesthesia Transfer of Care Note  Patient: Rachael Hancock  Procedure(s) Performed: RIGHT TOTAL SHOULDER ARTHROPLASTY (Right Shoulder)  Patient Location: PACU  Anesthesia Type:General  Level of Consciousness: awake and alert   Airway & Oxygen Therapy: Patient Spontanous Breathing  Post-op Assessment: Report given to RN and Post -op Vital signs reviewed and stable  Post vital signs: Reviewed and stable  Last Vitals:  Vitals Value Taken Time  BP    Temp    Pulse 91 01/08/2018  2:48 PM  Resp 19 01/08/2018  2:48 PM  SpO2 98 % 01/08/2018  2:48 PM  Vitals shown include unvalidated device data.  Last Pain:  Vitals:   01/08/18 1115  TempSrc:   PainSc: 1       Patients Stated Pain Goal: 3 (81/44/81 8563)  Complications: No apparent anesthesia complications

## 2018-01-08 NOTE — Anesthesia Procedure Notes (Addendum)
Anesthesia Regional Block: Interscalene brachial plexus block   Pre-Anesthetic Checklist: ,, timeout performed, Correct Patient, Correct Site, Correct Laterality, Correct Procedure, Correct Position, site marked, Risks and benefits discussed,  Surgical consent,  Pre-op evaluation,  At surgeon's request and post-op pain management  Laterality: Right  Prep: chloraprep       Needles:  Injection technique: Single-shot  Needle Type: Echogenic Stimulator Needle     Needle Length: 5cm  Needle Gauge: 22     Additional Needles:   Narrative:  Start time: 01/08/2018 12:19 PM End time: 01/08/2018 12:29 PM Injection made incrementally with aspirations every 5 mL.  Performed by: Personally  Anesthesiologist: Duane Boston, MD  Additional Notes: Functioning IV was confirmed and monitors applied.  A 50mm 22ga echogenic arrow stimulator was used. Sterile prep and drape,hand hygiene and sterile gloves were used.Ultrasound guidance: relevant anatomy identified, needle position confirmed, local anesthetic spread visualized around nerve(s)., vascular puncture avoided.  Image printed for medical record.  Negative aspiration and negative test dose prior to incremental administration of local anesthetic. The patient tolerated the procedure well.

## 2018-01-08 NOTE — Discharge Instructions (Signed)

## 2018-01-08 NOTE — Anesthesia Postprocedure Evaluation (Signed)
Anesthesia Post Note  Patient: Rachael Hancock  Procedure(s) Performed: RIGHT TOTAL SHOULDER ARTHROPLASTY (Right Shoulder)     Patient location during evaluation: PACU Anesthesia Type: General Level of consciousness: sedated Pain management: pain level controlled Vital Signs Assessment: post-procedure vital signs reviewed and stable Respiratory status: spontaneous breathing and respiratory function stable Cardiovascular status: stable Postop Assessment: no apparent nausea or vomiting Anesthetic complications: no    Last Vitals:  Vitals:   01/08/18 1624 01/08/18 1656  BP:  (!) 104/50  Pulse: 75 72  Resp: 13 16  Temp: 36.6 C   SpO2: 95% 96%    Last Pain:  Vitals:   01/08/18 1656  TempSrc:   PainSc: 2                  [name] DANIEL

## 2018-01-09 ENCOUNTER — Encounter (HOSPITAL_COMMUNITY): Payer: Self-pay | Admitting: Orthopedic Surgery

## 2018-01-09 LAB — CBC
HEMATOCRIT: 38.6 % (ref 36.0–46.0)
HEMOGLOBIN: 12.2 g/dL (ref 12.0–15.0)
MCH: 29.9 pg (ref 26.0–34.0)
MCHC: 31.6 g/dL (ref 30.0–36.0)
MCV: 94.6 fL (ref 78.0–100.0)
Platelets: 233 10*3/uL (ref 150–400)
RBC: 4.08 MIL/uL (ref 3.87–5.11)
RDW: 12.5 % (ref 11.5–15.5)
WBC: 11.3 10*3/uL — ABNORMAL HIGH (ref 4.0–10.5)

## 2018-01-09 LAB — BASIC METABOLIC PANEL
ANION GAP: 8 (ref 5–15)
BUN: 18 mg/dL (ref 6–20)
CHLORIDE: 107 mmol/L (ref 98–111)
CO2: 24 mmol/L (ref 22–32)
CREATININE: 0.93 mg/dL (ref 0.44–1.00)
Calcium: 8.7 mg/dL — ABNORMAL LOW (ref 8.9–10.3)
GFR calc non Af Amer: >60 mL/min (ref >60)
Glucose, Bld: 142 mg/dL — ABNORMAL HIGH (ref 70–99)
POTASSIUM: 4.3 mmol/L (ref 3.5–5.1)
Sodium: 139 mmol/L (ref 135–145)

## 2018-01-09 NOTE — Discharge Summary (Signed)
Patient ID: Rachael Hancock MRN: [id number] DOB/AGE: 06/28/1955 62 y.o.  Admit date: 01/08/2018 Discharge date: 01/09/2018  Admission Diagnoses:  Active Problems:   Status post total shoulder arthroplasty, right   Discharge Diagnoses:  Same  Past Medical History:  Diagnosis Date  . Anxiety   . Arthritis   . Complication of anesthesia    HA  WITH SPINAL, WISDOM TO AWHILE TO WAKE UP WHEN YOUNGER-   . Depression   . GERD (gastroesophageal reflux disease)   . Headache    MIGRAINE  . Hypertension   . Morbid obesity ([place])   . Palpitations   . Reflux   . Sleep apnea    BORDERLINE -  UNABLE TO USE CPAP 4-5 YRS AGO    Surgeries: Procedure(s): RIGHT TOTAL SHOULDER ARTHROPLASTY on 01/08/2018   Consultants:   Discharged Condition: Improved  Hospital Course: Rachael Hancock is an 62 y.o. female who was admitted 01/08/2018 for operative treatment of right shoulder OA. Patient has severe unremitting pain that affects sleep, daily activities, and work/hobbies. After pre-op clearance the patient was taken to the operating room on 01/08/2018 and underwent  Procedure(s): RIGHT TOTAL SHOULDER ARTHROPLASTY.    Patient was given perioperative antibiotics:  Anti-infectives (From admission, onward)   Start     Dose/Rate Route Frequency Ordered Stop   01/08/18 1900  ceFAZolin (ANCEF) IVPB 2g/100 mL premix     2 g 200 mL/hr over 30 Minutes Intravenous Every 6 hours 01/08/18 1645 01/09/18 0622   01/08/18 0700  ceFAZolin (ANCEF) 3 g in dextrose 5 % 50 mL IVPB     3 g 100 mL/hr over 30 Minutes Intravenous On call to O.R. 01/07/18 1331 01/08/18 1308       Patient was given sequential compression devices, early ambulation, and asa to prevent DVT.  Patient benefited maximally from hospital stay and there were no complications.    Recent vital signs:  Patient Vitals for the past 24 hrs:  BP Temp Temp src Pulse Resp SpO2 Height Weight  01/09/18 0514 (!) 109/46 97.7 F (36.5 C) Oral  66 - 94 % - -  01/08/18 2015 116/62 97.9 F (36.6 C) Oral 67 - 95 % - -  01/08/18 1656 (!) 104/50 - - 72 16 96 % - -  01/08/18 1624 - 97.9 F (36.6 C) - 75 13 95 % - -  01/08/18 1550 131/70 - - - - 95 % - -  01/08/18 1545 - - - 76 15 96 % - -  01/08/18 1535 131/79 - - 76 11 97 % - -  01/08/18 1520 (!) 144/61 - - 77 16 96 % - -  01/08/18 1505 (!) 160/111 - - 85 17 100 % - -  01/08/18 1449 - 97.7 F (36.5 C) - 91 19 98 % - -  01/08/18 1115 - - - - - - 5\' 8"  (1.727 m) 129.3 kg (285 lb)  01/08/18 1104 (!) 141/59 98.7 F (37.1 C) Oral 86 20 97 % - -     Recent laboratory studies:  Recent Labs    01/09/18 0422  WBC 11.3*  HGB 12.2  HCT 38.6  PLT 233  NA 139  K 4.3  CL 107  CO2 24  BUN 18  CREATININE 0.93  GLUCOSE 142*  CALCIUM 8.7*     Discharge Medications:   Allergies as of 01/09/2018      Reactions   Verapamil Other (See Comments)   Constipation.  Medication List    STOP taking these medications   meloxicam 15 MG tablet Commonly known as:  MOBIC   naproxen 500 MG tablet Commonly known as:  NAPROSYN     TAKE these medications   amLODipine 5 MG tablet Commonly known as:  NORVASC TAKE 1 TABLET (5 MG TOTAL) BY MOUTH DAILY.   bisacodyl 5 MG EC tablet Commonly known as:  DULCOLAX Take 5 mg by mouth daily as needed for moderate constipation.   docusate sodium 100 MG capsule Commonly known as:  COLACE Take 1 capsule (100 mg total) by mouth 3 (three) times daily as needed.   hydrochlorothiazide 12.5 MG capsule Commonly known as:  MICROZIDE TAKE 1 CAPSULE (12.5 MG TOTAL) BY MOUTH DAILY.   hydrocortisone 2.5 % rectal cream Commonly known as:  ANUSOL-HC Place rectally 2 (two) times daily. What changed:    how much to take  when to take this  reasons to take this   hydrocortisone cream 1 % Apply 1 application topically 2 (two) times daily as needed for itching.   lisinopril 20 MG tablet Commonly known as:  PRINIVIL,ZESTRIL TAKE 1 TABLET (20  MG TOTAL) BY MOUTH DAILY. PLEASE SCHEDULE AN APPOINTMENT FOR ADDITIONAL REFILLS What changed:    how much to take  additional instructions   loratadine 10 MG tablet Commonly known as:  CLARITIN Take 10 mg by mouth daily.   Lorcaserin HCl ER 20 MG Tb24 Commonly known as:  BELVIQ XR Take 1 tablet by mouth daily.   methocarbamol 500 MG tablet Commonly known as:  ROBAXIN Take 1 tablet (500 mg total) by mouth 3 (three) times daily.   metoprolol succinate 100 MG 24 hr tablet Commonly known as:  TOPROL-XL TAKE 1 TABLET (100 MG TOTAL) BY MOUTH DAILY. TAKE WITH OR IMMEDIATELY FOLLOWING A MEAL.   MULTIPLE MINERALS-VITAMINS PO Take 1 tablet by mouth 3 (three) times a week.   omeprazole 20 MG capsule Commonly known as:  PRILOSEC Take 20 mg by mouth daily.   oxyCODONE-acetaminophen 5-325 MG tablet Commonly known as:  PERCOCET Take 1-2 tablets by mouth every 4 (four) hours as needed for severe pain.   traMADol 50 MG tablet Commonly known as:  ULTRAM Take 50 mg by mouth daily as needed for moderate pain.   triamcinolone ointment 0.5 % Commonly known as:  KENALOG Apply 1 application topically 2 (two) times daily.   VISINE OP Place 2 drops into both eyes daily as needed (for dry eyes).       Diagnostic Studies: Dg Chest 2 View  Result Date: 01/02/2018 CLINICAL DATA:  Preoperative respiratory evaluation for shoulder surgery. EXAM: CHEST - 2 VIEW COMPARISON:  05/11/2013 FINDINGS: The lungs are clear without focal pneumonia, edema, pneumothorax or pleural effusion. The cardiopericardial silhouette is within normal limits for size. The visualized bony structures of the thorax are intact. IMPRESSION: No active cardiopulmonary disease. Electronically Signed   By: Misty Stanley M.D.   On: 01/02/2018 08:00   Dg Shoulder Right Port  Result Date: 01/08/2018 CLINICAL DATA:  Post RIGHT shoulder arthroplasty EXAM: PORTABLE RIGHT SHOULDER COMPARISON:  Portable exam 1453 hours compared to  09/04/2017 FINDINGS: Osseous demineralization. AC joint alignment normal. RIGHT shoulder prosthesis identified. No acute fracture or dislocation identified on single AP view. Visualized RIGHT ribs unremarkable. IMPRESSION: RIGHT shoulder prosthesis without apparent complication on single AP view. Electronically Signed   By: Lavonia Dana M.D.   On: 01/08/2018 15:24    Disposition: Discharge disposition: 01-Home or Self Care  Discharge Instructions    Call MD / Call 911   Complete by:  As directed    If you experience chest pain or shortness of breath, CALL 911 and be transported to the hospital emergency room.  If you develope a fever above 101 F, pus (white drainage) or increased drainage or redness at the wound, or calf pain, call your surgeon's office.   Constipation Prevention   Complete by:  As directed    Drink plenty of fluids.  Prune juice may be helpful.  You may use a stool softener, such as Colace (over the counter) 100 mg twice a day.  Use MiraLax (over the counter) for constipation as needed.   Diet - low sodium heart healthy   Complete by:  As directed    Increase activity slowly as tolerated   Complete by:  As directed       Follow-up Information    Tania Ade, MD. Schedule an appointment as soon as possible for a visit in 2 weeks.   Specialty:  Orthopedic Surgery Contact information: [address] [place] [address] [phone]            Signed: Grier Mitts 01/09/2018, 8:31 AM

## 2018-01-09 NOTE — Evaluation (Signed)
Occupational Therapy Evaluation and Discharge Patient Details Name: Rachael Hancock MRN: [id number] DOB: 1955/12/17 Today's Date: 01/09/2018    History of Present Illness Pt is a 62 y.o. female s/p R TSA. PMHx: Anxiety, Arthritis, Depression, GERD< HTN, Obesity, Sleep apnea.   Clinical Impression   Pt reports she was independent with ADL PTA. Currently pt requires min guard assist for functional mobility and min-mod assist overall for ADL. All shoulder, ADL, and safety education completed with pt; she verbalized/demonstrated understanding of education. Pt planning to d/c home with 24/7 supervision from family. No further acute OT needs identified; signing off at this time. Please re-consult if needs change. Thank you for this referral.    Follow Up Recommendations  Follow surgeon's recommendation for DC plan and follow-up therapies;Supervision/Assistance - 24 hour(initially)    Equipment Recommendations  None recommended by OT    Recommendations for Other Services       Precautions / Restrictions Precautions Precautions: Shoulder Type of Shoulder Precautions: NO AROM at shoulder. PROM FF 90, ER to neutral, NO ABD. NO pendulums. AROM elbow/wrist/hand OK. Shoulder Interventions: Shoulder sling/immobilizer;At all times;Off for dressing/bathing/exercises Precaution Booklet Issued: Yes (comment) Required Braces or Orthoses: Sling Restrictions Weight Bearing Restrictions: Yes RUE Weight Bearing: Non weight bearing      Mobility Bed Mobility               General bed mobility comments: Pt OOB in chair upon arrival  Transfers Overall transfer level: Needs assistance Equipment used: None Transfers: Sit to/from Stand Sit to Stand: Min assist;Min guard         General transfer comment: Min assist for initial sit to stand from chair, min guard for rest of sit to stands during session.    Balance Overall balance assessment: Mild deficits observed, not formally tested                                          ADL either performed or assessed with clinical judgement   ADL Overall ADL's : Needs assistance/impaired Eating/Feeding: Set up;Sitting Eating/Feeding Details (indicate cue type and reason): Educated on ability to use RUE for self feeding Grooming: Minimal assistance;Sitting;Standing   Upper Body Bathing: Minimal assistance;Sitting Upper Body Bathing Details (indicate cue type and reason): Educated on UB bathing technique Lower Body Bathing: Minimal assistance;Sit to/from stand   Upper Body Dressing : Sitting;Moderate assistance Upper Body Dressing Details (indicate cue type and reason): to don dress and sling Lower Body Dressing: Moderate assistance;Sit to/from stand Lower Body Dressing Details (indicate cue type and reason): Assist to start underwear over feet and pull up in standing Toilet Transfer: Min guard;Ambulation Toilet Transfer Details (indicate cue type and reason): Simulated       Tub/Shower Transfer Details (indicate cue type and reason): Recommend use of shower seat; pt agreeable Functional mobility during ADLs: Min guard General ADL Comments: Educated pt on RUE NWB, sling management and wear schedule, proper positioning RUE in bed/chair, ice for edema and pain, RUE exercises.     Vision         Perception     Praxis      Pertinent Vitals/Pain Pain Assessment: Faces Faces Pain Scale: Hurts a little bit Pain Location: R armpit Pain Descriptors / Indicators: Discomfort;Grimacing Pain Intervention(s): Monitored during session;Ice applied     Hand Dominance Right   Extremity/Trunk Assessment Upper Extremity Assessment Upper Extremity Assessment: RUE  deficits/detail RUE Deficits / Details: Able to wiggle fingers. Nerve block still intact RUE: Unable to fully assess due to pain;Unable to fully assess due to immobilization RUE Sensation: decreased light touch RUE Coordination: decreased fine  motor;decreased gross motor   Lower Extremity Assessment Lower Extremity Assessment: Overall WFL for tasks assessed       Communication Communication Communication: No difficulties   Cognition Arousal/Alertness: Awake/alert Behavior During Therapy: WFL for tasks assessed/performed Overall Cognitive Status: Within Functional Limits for tasks assessed                                     General Comments       Exercises Exercises: Shoulder Shoulder Exercises Shoulder Flexion: PROM;Right;10 reps;Seated(educated on performing in supine, limited ROM) Shoulder Extension: PROM;Right;10 reps;Seated Shoulder External Rotation: PROM;Right;10 reps;Seated Elbow Flexion: AAROM;Left;10 reps;Seated(assist provided due to nerve block still intact) Elbow Extension: AAROM;Right;10 reps;Seated Wrist Flexion: AAROM;Right;10 reps;Seated Wrist Extension: AAROM;Right;10 reps;Seated Digit Composite Flexion: AROM;Right;10 reps;Seated Composite Extension: AROM;Right;10 reps;Seated   Shoulder Instructions Shoulder Instructions Donning/doffing shirt without moving shoulder: Minimal assistance Method for sponge bathing under operated UE: Minimal assistance Donning/doffing sling/immobilizer: Moderate assistance Correct positioning of sling/immobilizer: Supervision/safety ROM for elbow, wrist and digits of operated UE: Minimal assistance Sling wearing schedule (on at all times/off for ADL's): Supervision/safety Proper positioning of operated UE when showering: Supervision/safety Positioning of UE while sleeping: Minimal assistance    Home Living Family/patient expects to be discharged to:: Private residence Living Arrangements: Spouse/significant other Available Help at Discharge: Family Type of Home: House Home Access: Stairs to enter Technical brewer of Steps: 1   Home Layout: Two level;Able to live on main level with bedroom/bathroom     Bathroom Shower/Tub: Emergency planning/management officer: Handicapped height     Home Equipment: [place] in          Prior Functioning/Environment Level of Independence: Independent                 OT Problem List: Decreased strength;Decreased range of motion;Impaired balance (sitting and/or standing);Decreased knowledge of precautions;Impaired sensation;Pain;Impaired UE functional use      OT Treatment/Interventions:      OT Goals(Current goals can be found in the care plan section) Acute Rehab OT Goals Patient Stated Goal: return home today OT Goal Formulation: All assessment and education complete, DC therapy  OT Frequency:     Barriers to D/C:            Co-evaluation              AM-PAC PT "6 Clicks" Daily Activity     Outcome Measure Help from another person eating meals?: A Little Help from another person taking care of personal grooming?: A Little Help from another person toileting, which includes using toliet, bedpan, or urinal?: A Little Help from another person bathing (including washing, rinsing, drying)?: A Little Help from another person to put on and taking off regular upper body clothing?: A Lot Help from another person to put on and taking off regular lower body clothing?: A Lot 6 Click Score: 16   End of Session Equipment Utilized During Treatment: Other (comment)(sling) Nurse Communication: Mobility status;Other (comment)(pt ready for d/c)  Activity Tolerance: Patient tolerated treatment well Patient left: in chair;with call bell/phone within reach  OT Visit Diagnosis: Unsteadiness on feet (R26.81);Pain Pain - Right/Left: Right Pain - part of body: Shoulder  Time: 7209-1980 OT Time Calculation (min): 38 min Charges:  OT General Charges $OT Visit: 1 Visit OT Evaluation $OT Eval Moderate Complexity: 1 Mod OT Treatments $Self Care/Home Management : 8-22 mins $Therapeutic Exercise: 8-22 mins G-Codes:     Tarhonda Hollenberg A. Ulice Brilliant, M.S.,  OTR/L Acute Rehab Department: [phone]  Binnie Kand 01/09/2018, 10:52 AM

## 2018-01-09 NOTE — Progress Notes (Signed)
Pt given prescriptions and discharge instructions. Instructions gone over with her and husband present. All questions answered to satisfaction. All belongings gathered to be sent home.

## 2018-01-09 NOTE — Progress Notes (Signed)
   PATIENT ID: Rachael Hancock   1 Day Post-Op Procedure(s) (LRB): RIGHT TOTAL SHOULDER ARTHROPLASTY (Right)  Subjective: Doing well with R shoulder, min pain.   Objective:  Vitals:   01/08/18 2015 01/09/18 0514  BP: 116/62 (!) 109/46  Pulse: 67 66  Resp:    Temp: 97.9 F (36.6 C) 97.7 F (36.5 C)  SpO2: 95% 94%     R UE dressing c/d/i Wiggles fingers, distally NVI  Labs:  Recent Labs    01/09/18 0422  HGB 12.2   Recent Labs    01/09/18 0422  WBC 11.3*  RBC 4.08  HCT 38.6  PLT 233   Recent Labs    01/09/18 0422  NA 139  K 4.3  CL 107  CO2 24  BUN 18  CREATININE 0.93  GLUCOSE 142*  CALCIUM 8.7*    Assessment and Plan: 1 day s/p right TSA OT- PROM goal to 90 RR O ER D/c home when cleared by OT Fu with Dr. Tamera Punt in 2 weeks  VTE proph: asa, SCDs

## 2018-01-12 ENCOUNTER — Encounter (HOSPITAL_COMMUNITY): Payer: Self-pay | Admitting: Orthopedic Surgery

## 2018-01-12 DIAGNOSIS — M19011 Primary osteoarthritis, right shoulder: Secondary | ICD-10-CM | POA: Diagnosis not present

## 2018-01-19 ENCOUNTER — Telehealth: Payer: Self-pay

## 2018-01-19 DIAGNOSIS — M25551 Pain in right hip: Secondary | ICD-10-CM

## 2018-01-19 DIAGNOSIS — M25552 Pain in left hip: Principal | ICD-10-CM

## 2018-01-19 MED ORDER — TRAMADOL HCL 50 MG PO TABS: 50.0000 mg | ORAL_TABLET | Freq: Four times a day (QID) | ORAL | 0 refills | Status: DC | PRN

## 2018-01-19 NOTE — Telephone Encounter (Signed)
I can send in a 5-day supply of Tramadol, but am limited by state regulation that I cannot send in more than a 5-day supply.  This should not be combined with medications like Oxycodone I would also recommend patient take Tylenol extra-strength 1000 mg scheduled every 4-6 hours. She likely just needs a higher dose for pain control

## 2018-01-19 NOTE — Telephone Encounter (Signed)
Pt advised.

## 2018-01-19 NOTE — Telephone Encounter (Signed)
Pt is having some arthritis pain in her hips which is keeping her up at night. She saw Luvenia Starch for this pain in May and imaging was done, Jade recommended first trying some PT.  Pt held off on PT due to having a shoulder surgery two weeks ago. She is unable to drive at this time but is still having hip pain. Pt was told by her surgeon that she should not take Ibuprofen since it slows healing, but she states Tylenol is not effective.  Wanting to know if there is any other medications or recommendations of things she can do for this pain to hold her over until she is able to make another appointment? Will be at least a week before she will be able to come in. Pain is mainly very bothersome at night and keeps her from sleeping.   Note to [place] since PCP and Luvenia Starch are out of office.

## 2018-01-23 DIAGNOSIS — M19011 Primary osteoarthritis, right shoulder: Secondary | ICD-10-CM | POA: Diagnosis not present

## 2018-02-17 ENCOUNTER — Other Ambulatory Visit: Payer: Self-pay | Admitting: *Deleted

## 2018-02-17 ENCOUNTER — Other Ambulatory Visit: Payer: Self-pay | Admitting: Family Medicine

## 2018-02-17 MED ORDER — LISINOPRIL 40 MG PO TABS: 40.0000 mg | ORAL_TABLET | Freq: Every day | ORAL | 0 refills | Status: DC

## 2018-02-20 DIAGNOSIS — Z9889 Other specified postprocedural states: Secondary | ICD-10-CM | POA: Diagnosis not present

## 2018-02-20 DIAGNOSIS — M19011 Primary osteoarthritis, right shoulder: Secondary | ICD-10-CM | POA: Diagnosis not present

## 2018-03-04 DIAGNOSIS — Z96611 Presence of right artificial shoulder joint: Secondary | ICD-10-CM | POA: Diagnosis not present

## 2018-03-04 DIAGNOSIS — R6889 Other general symptoms and signs: Secondary | ICD-10-CM | POA: Diagnosis not present

## 2018-03-04 DIAGNOSIS — M6281 Muscle weakness (generalized): Secondary | ICD-10-CM | POA: Diagnosis not present

## 2018-03-04 DIAGNOSIS — Z471 Aftercare following joint replacement surgery: Secondary | ICD-10-CM | POA: Diagnosis not present

## 2018-03-24 ENCOUNTER — Other Ambulatory Visit: Payer: Self-pay | Admitting: Family Medicine

## 2018-04-06 ENCOUNTER — Encounter: Payer: Self-pay | Admitting: Physician Assistant

## 2018-04-06 MED ORDER — NALTREXONE-BUPROPION HCL ER 8-90 MG PO TB12: ORAL_TABLET | ORAL | 0 refills | Status: DC

## 2018-04-08 DIAGNOSIS — Z96611 Presence of right artificial shoulder joint: Secondary | ICD-10-CM | POA: Diagnosis not present

## 2018-04-30 ENCOUNTER — Ambulatory Visit: Payer: Federal, State, Local not specified - PPO | Admitting: Family Medicine

## 2018-04-30 ENCOUNTER — Encounter: Payer: Self-pay | Admitting: Family Medicine

## 2018-04-30 VITALS — BP 141/77 | HR 91 | Wt 284.0 lb

## 2018-04-30 DIAGNOSIS — R350 Frequency of micturition: Secondary | ICD-10-CM

## 2018-04-30 LAB — POCT URINALYSIS DIPSTICK
Bilirubin, UA: NEGATIVE
Blood, UA: NEGATIVE
Glucose, UA: NEGATIVE
Ketones, UA: NEGATIVE
Leukocytes, UA: NEGATIVE
Nitrite, UA: NEGATIVE
Protein, UA: NEGATIVE
Spec Grav, UA: 1.02
Urobilinogen, UA: 0.2 U/dL
pH, UA: 5.5

## 2018-04-30 MED ORDER — CEPHALEXIN 500 MG PO CAPS: 500.0000 mg | ORAL_CAPSULE | Freq: Two times a day (BID) | ORAL | 0 refills | Status: DC

## 2018-04-30 NOTE — Progress Notes (Signed)
Rachael Hancock is a 62 y.o. female who presents to [facility] today for urinary frequency and mild low back pain.  Symptoms present for about a week.  Patient denies dysuria vomiting diarrhea abdominal pain.  She has symptoms are consistent with prior episodes of UTI.  She denies any vaginal discharge.  She denies any radiating pain weakness or numbness.  She has not tried any treatment yet.  She had a urinary tract infection prior to surgery in July.  Urine was not cultured then.   ROS as above:  Exam:  BP (!) 141/77   Pulse 91   Wt 284 lb (128.8 kg)   BMI 43.18 kg/m  Wt Readings from Last 5 Encounters:  04/30/18 284 lb (128.8 kg)  01/08/18 285 lb (129.3 kg)  01/02/18 285 lb (129.3 kg)  01/01/18 284 lb 7 oz (129 kg)  11/05/17 282 lb (127.9 kg)    Gen: Well NAD HEENT: EOMI,  MMM Lungs: Normal work of breathing. CTABL Heart: RRR no MRG Abd: NABS, Soft. Nondistended, Nontender no CVA angle tenderness to percussion. Exts: Brisk capillary refill, warm and well perfused.   Lab and Radiology Results Results for orders placed or performed in visit on 04/30/18 (from the past 72 hour(s))  POCT Urinalysis Dipstick     Status: None   Collection Time: 04/30/18 11:39 AM  Result Value Ref Range   Color, UA yellow    Clarity, UA clear    Glucose, UA Negative Negative   Bilirubin, UA negative    Ketones, UA negative    Spec Grav, UA 1.020 1.010 - 1.025   Blood, UA negative    pH, UA 5.5 5.0 - 8.0   Protein, UA Negative Negative   Urobilinogen, UA 0.2 0.2 or 1.0 E.U./dL   Nitrite, UA negative    Leukocytes, UA Negative Negative   Appearance     Odor     No results found.    Assessment and Plan: 62 y.o. female with urinary frequency.  When of care urinalysis unremarkable.  Unclear etiology.  Reasonable to for trial of empiric antibiotic therapy.   Recheck if not improving with work-up for possible bladder spasm or GYN cause.  Recheck as needed.   Orders Placed This Encounter  Procedures  . POCT Urinalysis Dipstick   No orders of the defined types were placed in this encounter.    Historical information moved to improve visibility of documentation.  Past Medical History:  Diagnosis Date  . Anxiety   . Arthritis   . Complication of anesthesia    HA  WITH SPINAL, WISDOM TO AWHILE TO WAKE UP WHEN YOUNGER-   . Depression   . GERD (gastroesophageal reflux disease)   . Headache    MIGRAINE  . Hypertension   . Morbid obesity ([place])   . Palpitations   . Reflux   . Sleep apnea    BORDERLINE -  UNABLE TO USE CPAP 4-5 YRS AGO   Past Surgical History:  Procedure Laterality Date  . ANKLE FRACTURE SURGERY    . cyst removed    . KIDNEY STONE SURGERY    . NOVASURE ABLATION    . TOTAL SHOULDER ARTHROPLASTY Right 01/08/2018   Procedure: RIGHT TOTAL SHOULDER ARTHROPLASTY;  Surgeon: Tania Ade, MD;  Location: [facility];  Service: Orthopedics;  Laterality: Right;  . WISDOM TOOTH EXTRACTION     Social History   Tobacco Use  . Smoking  status: Never Smoker  . Smokeless tobacco: Never Used  Substance Use Topics  . Alcohol use: Yes    Alcohol/week: 0.0 standard drinks   family history includes Arrhythmia in her mother; Cancer in her paternal grandmother; Coronary artery disease in her unknown relative; Diabetes in her maternal aunt; Heart attack in her father.  Medications: Current Outpatient Medications  Medication Sig Dispense Refill  . amLODipine (NORVASC) 5 MG tablet Take 1 tablet (5 mg total) by mouth daily. LAST REFILL.MUST SCHEDULE AND KEEP APPOINTMENT FOR REFILLS. 90 tablet 0  . bisacodyl (DULCOLAX) 5 MG EC tablet Take 5 mg by mouth daily as needed for moderate constipation.    . docusate sodium (COLACE) 100 MG capsule Take 1 capsule (100 mg total) by mouth 3 (three) times daily as needed. 20 capsule 0  .  hydrochlorothiazide (MICROZIDE) 12.5 MG capsule TAKE 1 CAPSULE (12.5 MG TOTAL) BY MOUTH DAILY. 90 capsule 1  . hydrocortisone (ANUSOL-HC) 2.5 % rectal cream Place rectally 2 (two) times daily. (Patient taking differently: Place 1 application rectally 2 (two) times daily as needed for hemorrhoids or anal itching. ) 60 g PRN  . hydrocortisone cream 1 % Apply 1 application topically 2 (two) times daily as needed for itching.    Marland Kitchen lisinopril (PRINIVIL,ZESTRIL) 40 MG tablet Take 1 tablet (40 mg total) by mouth daily. LAST REFILL.PLEASE SCHEDULE AND KEEP APPOINTMENT FOR REFILLS 90 tablet 0  . loratadine (CLARITIN) 10 MG tablet Take 10 mg by mouth daily.    . methocarbamol (ROBAXIN) 500 MG tablet Take 1 tablet (500 mg total) by mouth 3 (three) times daily. 30 tablet 0  . metoprolol succinate (TOPROL-XL) 100 MG 24 hr tablet TAKE 1 TABLET (100 MG TOTAL) BY MOUTH DAILY. TAKE WITH OR IMMEDIATELY FOLLOWING A MEAL. 90 tablet 1  . MULTIPLE MINERALS-VITAMINS PO Take 1 tablet by mouth 3 (three) times a week.     . Naltrexone-buPROPion HCl ER (CONTRAVE) 8-90 MG TB12 1 tab PO QAM x 1 week, then 1 Tab po BID x 1 week, then 2 tab po QAM with 1 tab po QPM x 1 week, then 2 tabs po BID. 120 tablet 0  . omeprazole (PRILOSEC) 20 MG capsule Take 20 mg by mouth daily.     . Tetrahydrozoline HCl (VISINE OP) Place 2 drops into both eyes daily as needed (for dry eyes).    . traMADol (ULTRAM) 50 MG tablet Take 1 tablet (50 mg total) by mouth every 6 (six) hours as needed for severe pain. 20 tablet 0  . triamcinolone ointment (KENALOG) 0.5 % Apply 1 application topically 2 (two) times daily. 30 g 0   No current facility-administered medications for this visit.    Allergies  Allergen Reactions  . Verapamil Other (See Comments)    Constipation.      Discussed warning signs or symptoms. Please see discharge instructions. Patient expresses understanding.

## 2018-04-30 NOTE — Patient Instructions (Signed)
Thank you for coming in today.  Take keflex twice daily for 1 week.  If not improving let us know.   Recheck as needed.   If your belly pain worsens, or you have high fever, bad vomiting, blood in your stool or black tarry stool go to the Emergency Room.     Urinary Tract Infection, Adult A urinary tract infection (UTI) is an infection of any part of the urinary tract, which includes the kidneys, ureters, bladder, and urethra. These organs make, store, and get rid of urine in the body. UTI can be a bladder infection (cystitis) or kidney infection (pyelonephritis). What are the causes? This infection may be caused by fungi, viruses, or bacteria. Bacteria are the most common cause of UTIs. This condition can also be caused by repeated incomplete emptying of the bladder during urination. What increases the risk? This condition is more likely to develop if:  You ignore your need to urinate or hold urine for long periods of time.  You do not empty your bladder completely during urination.  You wipe back to front after urinating or having a bowel movement, if you are female.  You are uncircumcised, if you are female.  You are constipated.  You have a urinary catheter that stays in place (indwelling).  You have a weak defense (immune) system.  You have a medical condition that affects your bowels, kidneys, or bladder.  You have diabetes.  You take antibiotic medicines frequently or for long periods of time, and the antibiotics no longer work well against certain types of infections (antibiotic resistance).  You take medicines that irritate your urinary tract.  You are exposed to chemicals that irritate your urinary tract.  You are female.  What are the signs or symptoms? Symptoms of this condition include:  Fever.  Frequent urination or passing small amounts of urine frequently.  Needing to urinate urgently.  Pain or burning with urination.  Urine that smells bad or  unusual.  Cloudy urine.  Pain in the lower abdomen or back.  Trouble urinating.  Blood in the urine.  Vomiting or being less hungry than normal.  Diarrhea or abdominal pain.  Vaginal discharge, if you are female.  How is this diagnosed? This condition is diagnosed with a medical history and physical exam. You will also need to provide a urine sample to test your urine. Other tests may be done, including:  Blood tests.  Sexually transmitted disease (STD) testing.  If you have had more than one UTI, a cystoscopy or imaging studies may be done to determine the cause of the infections. How is this treated? Treatment for this condition often includes a combination of two or more of the following:  Antibiotic medicine.  Other medicines to treat less common causes of UTI.  Over-the-counter medicines to treat pain.  Drinking enough water to stay hydrated.  Follow these instructions at home:  Take over-the-counter and prescription medicines only as told by your health care provider.  If you were prescribed an antibiotic, take it as told by your health care provider. Do not stop taking the antibiotic even if you start to feel better.  Avoid alcohol, caffeine, tea, and carbonated beverages. They can irritate your bladder.  Drink enough fluid to keep your urine clear or pale yellow.  Keep all follow-up visits as told by your health care provider. This is important.  Make sure to: ? Empty your bladder often and completely. Do not hold urine for long periods of time. ?  Empty your bladder before and after sex. ? Wipe from front to back after a bowel movement if you are female. Use each tissue one time when you wipe. Contact a health care provider if:  You have back pain.  You have a fever.  You feel nauseous or vomit.  Your symptoms do not get better after 3 days.  Your symptoms go away and then return. Get help right away if:  You have severe back pain or lower  abdominal pain.  You are vomiting and cannot keep down any medicines or water. This information is not intended to replace advice given to you by your health care provider. Make sure you discuss any questions you have with your health care provider. Document Released: 03/20/2005 Document Revised: 11/22/2015 Document Reviewed: 05/01/2015 Elsevier Interactive Patient Education  [employer].

## 2018-05-03 ENCOUNTER — Other Ambulatory Visit: Payer: Self-pay | Admitting: Family Medicine

## 2018-05-15 ENCOUNTER — Other Ambulatory Visit: Payer: Self-pay | Admitting: Family Medicine

## 2018-05-25 LAB — HM MAMMOGRAPHY

## 2018-05-27 ENCOUNTER — Other Ambulatory Visit: Payer: Self-pay | Admitting: Family Medicine

## 2018-06-08 DIAGNOSIS — Z1231 Encounter for screening mammogram for malignant neoplasm of breast: Secondary | ICD-10-CM | POA: Diagnosis not present

## 2018-06-10 ENCOUNTER — Encounter: Payer: Self-pay | Admitting: Family Medicine

## 2018-06-10 ENCOUNTER — Ambulatory Visit: Payer: Federal, State, Local not specified - PPO | Admitting: Family Medicine

## 2018-06-10 VITALS — BP 126/56 | HR 77 | Ht 68.0 in | Wt 285.0 lb

## 2018-06-10 DIAGNOSIS — I10 Essential (primary) hypertension: Secondary | ICD-10-CM | POA: Diagnosis not present

## 2018-06-10 DIAGNOSIS — G4733 Obstructive sleep apnea (adult) (pediatric): Secondary | ICD-10-CM

## 2018-06-10 MED ORDER — AMLODIPINE BESYLATE 5 MG PO TABS: ORAL_TABLET | ORAL | 0 refills | Status: DC

## 2018-06-10 MED ORDER — LISINOPRIL 40 MG PO TABS: ORAL_TABLET | ORAL | 0 refills | Status: DC

## 2018-06-10 NOTE — Progress Notes (Signed)
Subjective:    CC:   HPI:  Hypertension- Pt denies chest pain, SOB, dizziness, or heart palpitations.  Taking meds as directed w/o problems.  Denies medication side effects.    F/U OSA -she admits she has not been using her CPAP.  She says she really just cannot get used to something on her face.  Plus she tosses and turns a lot and then gets tangled up in it.  He is willing to consider an oral appliance.  Last labs showed a low calcium. She hasn't had them rechecked yet.     Past medical history, Surgical history, Family history not pertinant except as noted below, Social history, Allergies, and medications have been entered into the medical record, reviewed, and corrections made.   Review of Systems: No fevers, chills, night sweats, weight loss, chest pain, or shortness of breath.   Objective:    General: Well Developed, well nourished, and in no acute distress.  Neuro: Alert and oriented x3, extra-ocular muscles intact, sensation grossly intact.  HEENT: Normocephalic, atraumatic  Skin: Warm and dry, no rashes. Cardiac: Regular rate and rhythm, no murmurs rubs or gallops, no lower extremity edema.  Respiratory: Clear to auscultation bilaterally. Not using accessory muscles, speaking in full sentences.   Impression and Recommendations:    HTN - Well controlled. Continue current regimen. Follow up in  6 months.    Hypocalcemia  - due to recheck calcium.    OSA - will refer for an oral appliance.

## 2018-06-11 ENCOUNTER — Encounter: Payer: Self-pay | Admitting: Family Medicine

## 2018-06-11 LAB — BASIC METABOLIC PANEL WITH GFR
BUN: 18 mg/dL (ref 7–25)
CO2: 24 mmol/L (ref 20–32)
CREATININE: 0.96 mg/dL (ref 0.50–0.99)
Calcium: 9.5 mg/dL (ref 8.6–10.4)
Chloride: 104 mmol/L (ref 98–110)
GFR, EST NON AFRICAN AMERICAN: 64 mL/min/{1.73_m2} (ref >=60)
GFR, Est African American: 74 mL/min/{1.73_m2} (ref >=60)
GLUCOSE: 92 mg/dL (ref 65–99)
POTASSIUM: 3.8 mmol/L (ref 3.5–5.3)
Sodium: 140 mmol/L (ref 135–146)

## 2018-06-11 NOTE — Progress Notes (Signed)
All labs are normal. 

## 2018-07-06 ENCOUNTER — Encounter: Payer: Self-pay | Admitting: Family Medicine

## 2018-07-06 ENCOUNTER — Ambulatory Visit: Payer: Federal, State, Local not specified - PPO | Admitting: Family Medicine

## 2018-07-06 VITALS — BP 139/59 | HR 83 | Ht 68.0 in | Wt 285.0 lb

## 2018-07-06 DIAGNOSIS — R1012 Left upper quadrant pain: Secondary | ICD-10-CM | POA: Diagnosis not present

## 2018-07-06 DIAGNOSIS — R109 Unspecified abdominal pain: Secondary | ICD-10-CM

## 2018-07-06 DIAGNOSIS — R0602 Shortness of breath: Secondary | ICD-10-CM | POA: Diagnosis not present

## 2018-07-06 LAB — POCT URINALYSIS DIPSTICK
BILIRUBIN UA: NEGATIVE
GLUCOSE UA: NEGATIVE
KETONES UA: NEGATIVE
LEUKOCYTES UA: NEGATIVE
Nitrite, UA: NEGATIVE
Protein, UA: NEGATIVE
SPEC GRAV UA: 1.025 (ref 1.010–1.025)
Urobilinogen, UA: 0.2 E.U./dL
pH, UA: 5.5 (ref 5.0–8.0)

## 2018-07-06 NOTE — Progress Notes (Signed)
Acute Office Visit  Subjective:    Patient ID: Rachael Hancock, female    DOB: Oct 13, 1955, 63 y.o.   MRN: [id number]  Chief Complaint  Patient presents with  . Abdominal Pain  . Back Pain    HPI   Patient is in today for abdominal pain, back pain and shortness of breath.  She says last week she actually had 2 migraines within 1 week which is not typical for her and then over the weekend starting on Saturday she started to feel like her heart was pounding in her head.  She says she is experienced that before but is never lasted a most all day like it did that day.  She then noticed that it was painful to take a deep breath but was also having pain below both ribs worse on the left compared to the right even when not taking a deep breath.  She denies any recent increase in heartburn symptoms.  Last week she also had a little bit of left flank pain but that actually got better over the weekend she is not had any dysuria hematuria or foul odor to her urine.  Yesterday she actually started to feel a lot of congestion behind her nose but she has not been actually blowing out any nasal discharge.  She has not tried taking any over-the-counter medications.  She is most concerned with the pressure underneath her left breast and in that left upper abdomen.  She is also had a little bit of nausea but no vomiting.  No significant change in bowel movements.  He is also had some intermittent lower abdominal cramping that started even before this.  In fact she has an OB/GYN appointment later this month and was planning on addressing that then.  She is requesting a refill on her hemorrhoid cream.  Past Medical History:  Diagnosis Date  . Anxiety   . Arthritis   . Complication of anesthesia    HA  WITH SPINAL, WISDOM TO AWHILE TO WAKE UP WHEN YOUNGER-   . Depression   . GERD (gastroesophageal reflux disease)   . Headache    MIGRAINE  . Hypertension   . Morbid obesity ([place])   . Palpitations   .  Reflux   . Sleep apnea    BORDERLINE -  UNABLE TO USE CPAP 4-5 YRS AGO    Past Surgical History:  Procedure Laterality Date  . ANKLE FRACTURE SURGERY    . cyst removed    . KIDNEY STONE SURGERY    . NOVASURE ABLATION    . TOTAL SHOULDER ARTHROPLASTY Right 01/08/2018   Procedure: RIGHT TOTAL SHOULDER ARTHROPLASTY;  Surgeon: Tania Ade, MD;  Location: [facility];  Service: Orthopedics;  Laterality: Right;  . WISDOM TOOTH EXTRACTION      Family History  Problem Relation Age of Onset  . Heart attack Father        at age 61  . Cancer Paternal Grandmother   . Coronary artery disease Unknown   . Arrhythmia Mother        pacemaker insertion  . Diabetes Maternal Aunt     Social History   Socioeconomic History  . Marital status: Married    Spouse name: Octavia Bruckner  . Number of children: 2  . Years of education: 3 YRS COLL  . Highest education level: Not on file  Occupational History  . Occupation: Barista SERVICE REP    Employer: SELF    Comment: [employer]  Social Needs  .  Financial resource strain: Not on file  . Food insecurity:    Worry: Not on file    Inability: Not on file  . Transportation needs:    Medical: Not on file    Non-medical: Not on file  Tobacco Use  . Smoking status: Never Smoker  . Smokeless tobacco: Never Used  Substance and Sexual Activity  . Alcohol use: Yes    Alcohol/week: 0.0 standard drinks  . Drug use: No  . Sexual activity: Not on file  Lifestyle  . Physical activity:    Days per week: Not on file    Minutes per session: Not on file  . Stress: Not on file  Relationships  . Social connections:    Talks on phone: Not on file    Gets together: Not on file    Attends religious service: Not on file    Active member of club or organization: Not on file    Attends meetings of clubs or organizations: Not on file    Relationship status: Not on file  . Intimate partner violence:    Fear of current or ex partner: Not on file     Emotionally abused: Not on file    Physically abused: Not on file    Forced sexual activity: Not on file  Other Topics Concern  . 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 amLODipine (NORVASC) 5 MG tablet 1 po QD. Ok to fill when next due 90 tablet 0  . hydrocortisone (ANUSOL-HC) 2.5 % rectal cream Place rectally 2 (two) times daily. (Patient taking differently: Place 1 application rectally 2 (two) times daily as needed for hemorrhoids or anal itching. ) 60 g PRN  . hydrocortisone cream 1 % Apply 1 application topically 2 (two) times daily as needed for itching.    Marland Kitchen lisinopril (PRINIVIL,ZESTRIL) 40 MG tablet 1 po QD. Ok to fill when next due 90 tablet 0  . loratadine (CLARITIN) 10 MG tablet Take 10 mg by mouth daily.    . MULTIPLE MINERALS-VITAMINS PO Take 1 tablet by mouth 3 (three) times a week.     . Naltrexone-buPROPion HCl ER (CONTRAVE) 8-90 MG TB12 1 tab PO QAM x 1 week, then 1 Tab po BID x 1 week, then 2 tab po QAM with 1 tab po QPM x 1 week, then 2 tabs po BID. 120 tablet 0  . omeprazole (PRILOSEC) 20 MG capsule Take 20 mg by mouth daily.     . Tetrahydrozoline HCl (VISINE OP) Place 2 drops into both eyes daily as needed (for dry eyes).     No facility-administered medications prior to visit.     Allergies  Allergen Reactions  . Verapamil Other (See Comments)    Constipation.     ROS     Objective:    Physical Exam  Constitutional: She is oriented to person, place, and time. She appears well-developed and well-nourished.  HENT:  Head: Normocephalic and atraumatic.  Cardiovascular: Normal rate, regular rhythm and normal heart sounds.  Pulmonary/Chest: Effort normal and breath sounds normal.  Abdominal: Soft. Bowel sounds are normal. She exhibits no distension and no mass. There is abdominal tenderness.  There is no rebound and no guarding.  Tender to palpation of the abdomen in the left upper quadrant and epigastric  area.  No rebound or guarding.  Neurological: She is alert and oriented to person, place, and time.  Skin: Skin is warm and dry.  Psychiatric: She has a normal mood and affect. Her behavior is normal.    BP (!) 139/59   Pulse 83   Ht 5\' 8"  (1.727 m)   Wt 285 lb (129.3 kg)   SpO2 100%   BMI 43.33 kg/m  Wt Readings from Last 3 Encounters:  07/06/18 285 lb (129.3 kg)  06/10/18 285 lb (129.3 kg)  04/30/18 284 lb (128.8 kg)    There are no preventive care reminders to display for this patient.  There are no preventive care reminders to display for this patient.   Lab Results  Component Value Date   TSH 3.40 09/04/2017   Lab Results  Component Value Date   WBC 11.3 (H) 01/09/2018   HGB 12.2 01/09/2018   HCT 38.6 01/09/2018   MCV 94.6 01/09/2018   PLT 233 01/09/2018   Lab Results  Component Value Date   NA 140 06/10/2018   K 3.8 06/10/2018   CO2 24 06/10/2018   GLUCOSE 92 06/10/2018   BUN 18 06/10/2018   CREATININE 0.96 06/10/2018   BILITOT 0.9 01/01/2018   ALKPHOS 67 01/01/2018   AST 18 01/01/2018   ALT 18 01/01/2018   PROT 6.7 01/01/2018   ALBUMIN 3.9 01/01/2018   CALCIUM 9.5 06/10/2018   ANIONGAP 8 01/09/2018   Lab Results  Component Value Date   CHOL 194 11/20/2017   Lab Results  Component Value Date   HDL 61 11/20/2017   Lab Results  Component Value Date   LDLCALC 119 (H) 11/20/2017   Lab Results  Component Value Date   TRIG 53 11/20/2017   Lab Results  Component Value Date   CHOLHDL 3.2 11/20/2017   No results found for: HGBA1C     Assessment & Plan:   Problem List Items Addressed This Visit    None    Visit Diagnoses    SOB (shortness of breath)    -  Primary   Relevant Orders   EKG 12-Lead   COMPLETE METABOLIC PANEL WITH GFR   TSH   Lipase   CBC with Differential/Platelet   Flank pain       Relevant Orders    POCT urinalysis dipstick (Completed)   COMPLETE METABOLIC PANEL WITH GFR   TSH   Lipase   CBC with Differential/Platelet   LUQ pain       Relevant Orders   COMPLETE METABOLIC PANEL WITH GFR   TSH   Lipase   CBC with Differential/Platelet   Abdominal cramping       Relevant Orders   COMPLETE METABOLIC PANEL WITH GFR   TSH   Lipase   CBC with Differential/Platelet     Unclear etiology at this point.  She is feeling a little bit short of breath but no cough or wheeze or sputum production.  She is having really some left upper quadrant pain will evaluate for pancreatitis.  Also check a CBC with differential to look for infection.  She did have a little bit of left flank pain at the end of last week that that seems to have gotten better in the last couple days we will go ahead and perform a urinalysis as well just to rule out UTI though she is not having any dysuria or hematuria.  EKG today shows rate of 86 bpm, normal sinus rhythm with no acute ST-T  wave changes.  I did compare to previous in July and it was the same.  Shortness of breath-again unclear etiology.  Pulse ox is normal.  Lungs are clear.  She is not currently feeling short of breath.    No orders of the defined types were placed in this encounter.    Beatrice Lecher, MD

## 2018-07-07 ENCOUNTER — Other Ambulatory Visit: Payer: Self-pay | Admitting: *Deleted

## 2018-07-07 DIAGNOSIS — K648 Other hemorrhoids: Secondary | ICD-10-CM

## 2018-07-07 LAB — COMPLETE METABOLIC PANEL WITH GFR
AG Ratio: 1.4 (calc) (ref 1.0–2.5)
ALT: 14 U/L (ref 6–29)
AST: 14 U/L (ref 10–35)
Albumin: 4.2 g/dL (ref 3.6–5.1)
Alkaline phosphatase (APISO): 78 U/L (ref 33–130)
BUN: 14 mg/dL (ref 7–25)
CALCIUM: 9.6 mg/dL (ref 8.6–10.4)
CO2: 27 mmol/L (ref 20–32)
CREATININE: 0.86 mg/dL (ref 0.50–0.99)
Chloride: 104 mmol/L (ref 98–110)
GFR, EST NON AFRICAN AMERICAN: 73 mL/min/{1.73_m2} (ref >=60)
GFR, Est African American: 85 mL/min/{1.73_m2} (ref >=60)
GLOBULIN: 2.9 g/dL (ref 1.9–3.7)
Glucose, Bld: 89 mg/dL (ref 65–99)
Potassium: 3.7 mmol/L (ref 3.5–5.3)
SODIUM: 141 mmol/L (ref 135–146)
Total Bilirubin: 0.8 mg/dL (ref 0.2–1.2)
Total Protein: 7.1 g/dL (ref 6.1–8.1)

## 2018-07-07 LAB — CBC WITH DIFFERENTIAL/PLATELET
Absolute Monocytes: 525 cells/uL (ref 200–950)
BASOS ABS: 38 {cells}/uL (ref 0–200)
Basophils Relative: 0.5 %
EOS ABS: 120 {cells}/uL (ref 15–500)
Eosinophils Relative: 1.6 %
HEMATOCRIT: 42.1 % (ref 35.0–45.0)
HEMOGLOBIN: 14.1 g/dL (ref 11.7–15.5)
LYMPHS ABS: 2430 {cells}/uL (ref 850–3900)
MCH: 29.6 pg (ref 27.0–33.0)
MCHC: 33.5 g/dL (ref 32.0–36.0)
MCV: 88.4 fL (ref 80.0–100.0)
MPV: 11 fL (ref 7.5–12.5)
Monocytes Relative: 7 %
Neutro Abs: 4388 cells/uL (ref 1500–7800)
Neutrophils Relative %: 58.5 %
Platelets: 308 10*3/uL (ref 140–400)
RBC: 4.76 10*6/uL (ref 3.80–5.10)
RDW: 12.4 % (ref 11.0–15.0)
Total Lymphocyte: 32.4 %
WBC: 7.5 10*3/uL (ref 3.8–10.8)

## 2018-07-07 LAB — LIPASE: LIPASE: 16 U/L (ref 7–60)

## 2018-07-07 LAB — TSH: TSH: 5.18 mIU/L — ABNORMAL HIGH (ref 0.40–4.50)

## 2018-07-07 MED ORDER — HYDROCORTISONE 2.5 % RE CREA: TOPICAL_CREAM | Freq: Two times a day (BID) | RECTAL | 99 refills | Status: DC

## 2018-07-09 ENCOUNTER — Encounter: Payer: Self-pay | Admitting: Family Medicine

## 2018-07-09 DIAGNOSIS — G4733 Obstructive sleep apnea (adult) (pediatric): Secondary | ICD-10-CM

## 2018-07-09 MED ORDER — AMBULATORY NON FORMULARY MEDICATION: 0 refills | Status: DC

## 2018-07-09 NOTE — Telephone Encounter (Signed)
Ahead and place new order for CPAP through [facility] or [facility].  Her sleep study should be on file.  Please let her know that 1 of the company should be contacting her soon to set her up for the machine and supplies.  Really think she had some type of viral illness especially with the shortness of breath and now she is having a lot of drainage.  I encouraged her to give it through the weekend recommend warm salt water irrigation of the nasal passages and salt water gargles over the weekend and see if this helps. It could be that she is developing a sinus infection. Let us know if getting any facial pressure.

## 2018-07-09 NOTE — Telephone Encounter (Signed)
Order placed

## 2018-07-09 NOTE — Telephone Encounter (Signed)
Order faxed.

## 2018-07-09 NOTE — Telephone Encounter (Signed)
Rx pended and documents printed. Pending PCP review for settings. Will fax to [facility].

## 2018-07-22 DIAGNOSIS — Z09 Encounter for follow-up examination after completed treatment for conditions other than malignant neoplasm: Secondary | ICD-10-CM | POA: Diagnosis not present

## 2018-07-22 DIAGNOSIS — M19011 Primary osteoarthritis, right shoulder: Secondary | ICD-10-CM | POA: Diagnosis not present

## 2018-08-07 DIAGNOSIS — Z888 Allergy status to other drugs, medicaments and biological substances status: Secondary | ICD-10-CM | POA: Diagnosis not present

## 2018-08-07 DIAGNOSIS — G43909 Migraine, unspecified, not intractable, without status migrainosus: Secondary | ICD-10-CM | POA: Diagnosis not present

## 2018-08-07 DIAGNOSIS — K219 Gastro-esophageal reflux disease without esophagitis: Secondary | ICD-10-CM | POA: Diagnosis not present

## 2018-08-07 DIAGNOSIS — N200 Calculus of kidney: Secondary | ICD-10-CM | POA: Diagnosis not present

## 2018-08-07 DIAGNOSIS — Z1211 Encounter for screening for malignant neoplasm of colon: Secondary | ICD-10-CM | POA: Diagnosis not present

## 2018-08-07 DIAGNOSIS — I10 Essential (primary) hypertension: Secondary | ICD-10-CM | POA: Diagnosis not present

## 2018-08-07 DIAGNOSIS — G4730 Sleep apnea, unspecified: Secondary | ICD-10-CM | POA: Diagnosis not present

## 2018-08-07 DIAGNOSIS — Z79899 Other long term (current) drug therapy: Secondary | ICD-10-CM | POA: Diagnosis not present

## 2018-08-07 DIAGNOSIS — Z86010 Personal history of colonic polyps: Secondary | ICD-10-CM | POA: Diagnosis not present

## 2018-08-07 DIAGNOSIS — M1990 Unspecified osteoarthritis, unspecified site: Secondary | ICD-10-CM | POA: Diagnosis not present

## 2018-08-07 DIAGNOSIS — D120 Benign neoplasm of cecum: Secondary | ICD-10-CM | POA: Diagnosis not present

## 2018-08-07 DIAGNOSIS — Z6841 Body Mass Index (BMI) 40.0 and over, adult: Secondary | ICD-10-CM | POA: Diagnosis not present

## 2018-08-07 DIAGNOSIS — Z9889 Other specified postprocedural states: Secondary | ICD-10-CM | POA: Diagnosis not present

## 2018-08-07 DIAGNOSIS — D122 Benign neoplasm of ascending colon: Secondary | ICD-10-CM | POA: Diagnosis not present

## 2018-08-12 LAB — HM COLONOSCOPY

## 2018-08-28 ENCOUNTER — Telehealth: Payer: Self-pay | Admitting: *Deleted

## 2018-08-28 NOTE — Telephone Encounter (Signed)
Advised pt that [facility] has been attempting to contact her and has not been able to reach her. She informed me that she has received the equipment in the mail and hasn't had a chance to call them about this but will call.Rachael Hancock, CMA.Marland Kitchen

## 2018-09-01 DIAGNOSIS — Z6841 Body Mass Index (BMI) 40.0 and over, adult: Secondary | ICD-10-CM | POA: Diagnosis not present

## 2018-09-01 DIAGNOSIS — Z01419 Encounter for gynecological examination (general) (routine) without abnormal findings: Secondary | ICD-10-CM | POA: Diagnosis not present

## 2018-09-09 DIAGNOSIS — G4733 Obstructive sleep apnea (adult) (pediatric): Secondary | ICD-10-CM | POA: Diagnosis not present

## 2018-09-15 IMAGING — CR DG CHEST 2V
2 series · 2 of 2 positions shown · non-contrast
Comparison: 05/11/2013

CLINICAL DATA: Preoperative respiratory evaluation for shoulder
surgery.

EXAM:
CHEST - 2 VIEW

[w chest pa]
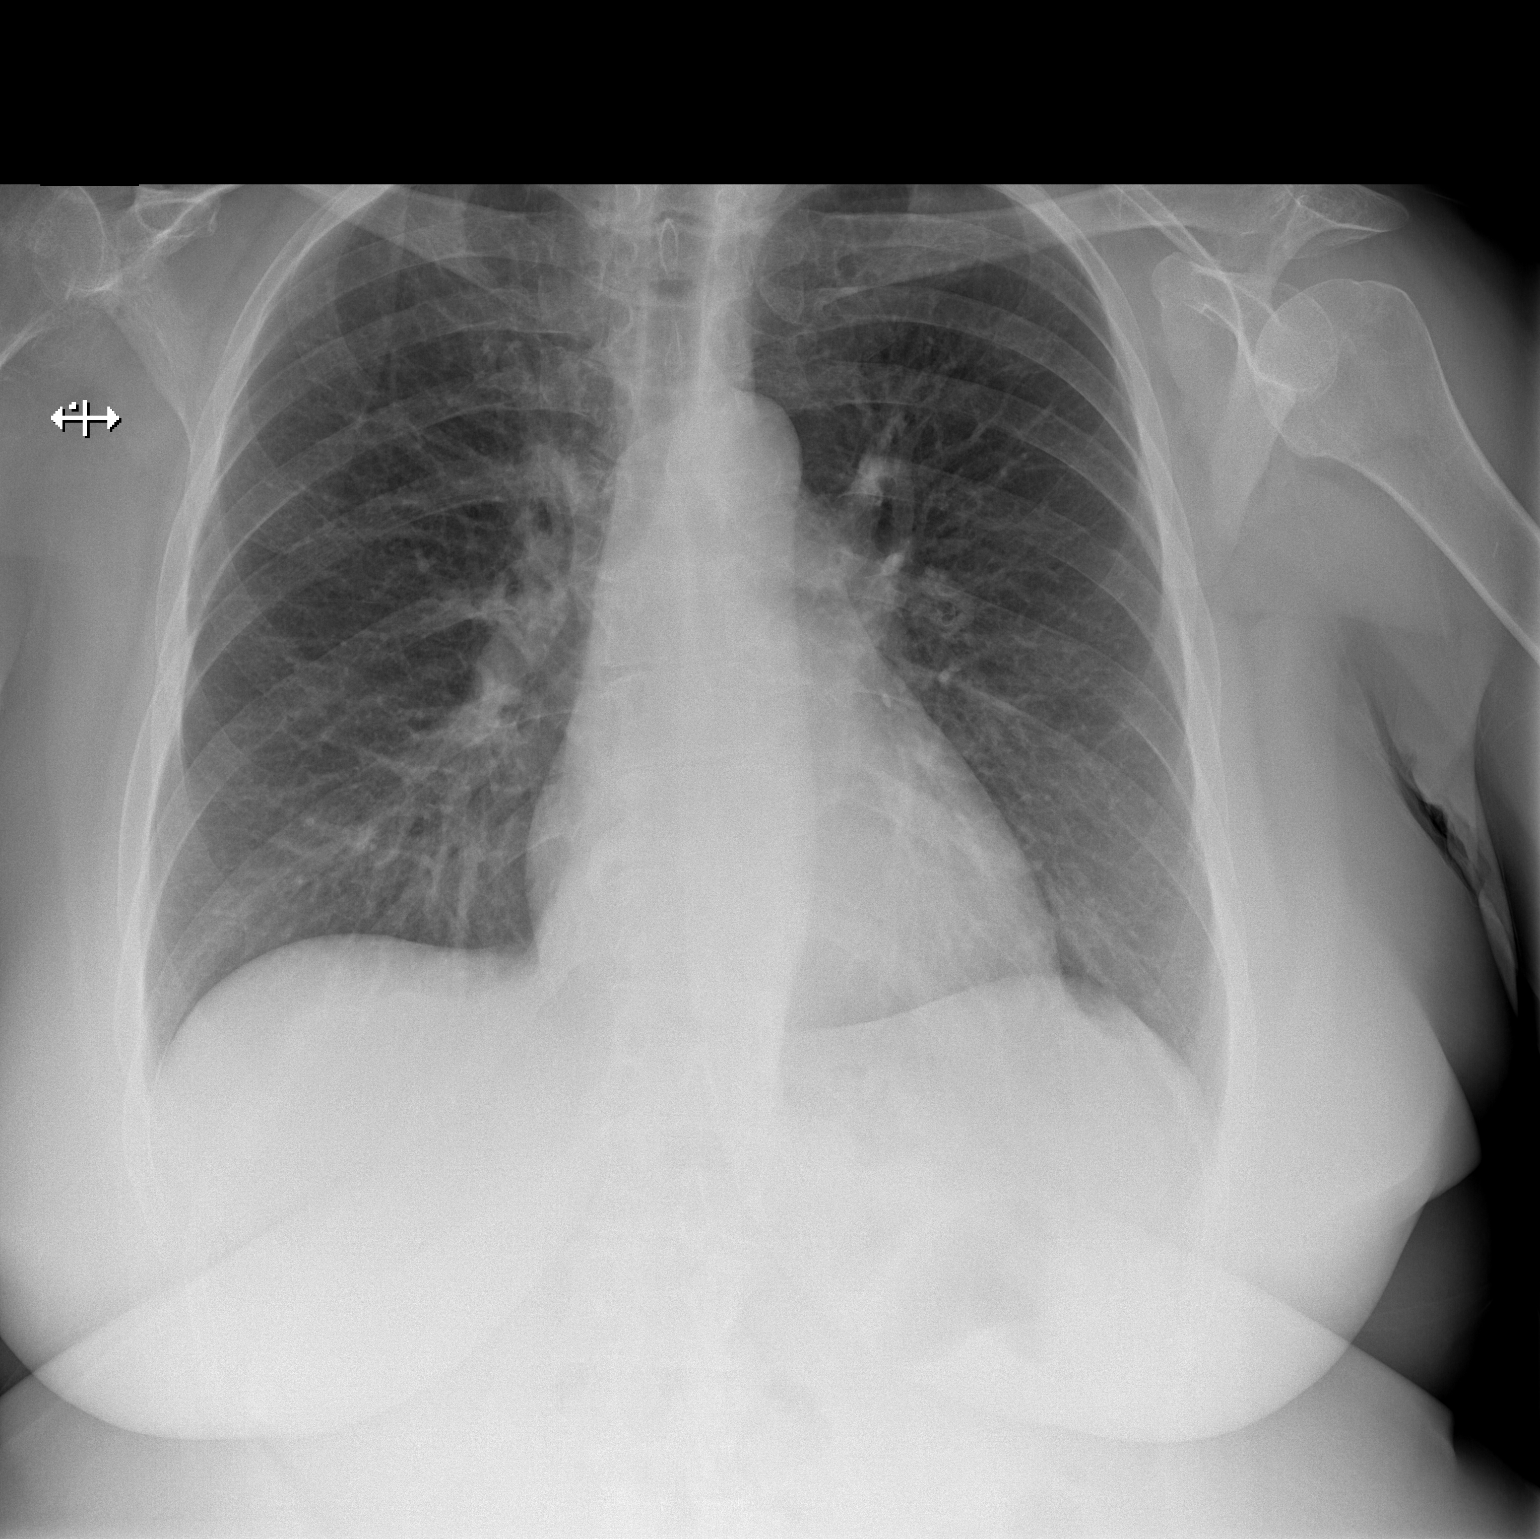

[w chest lat]
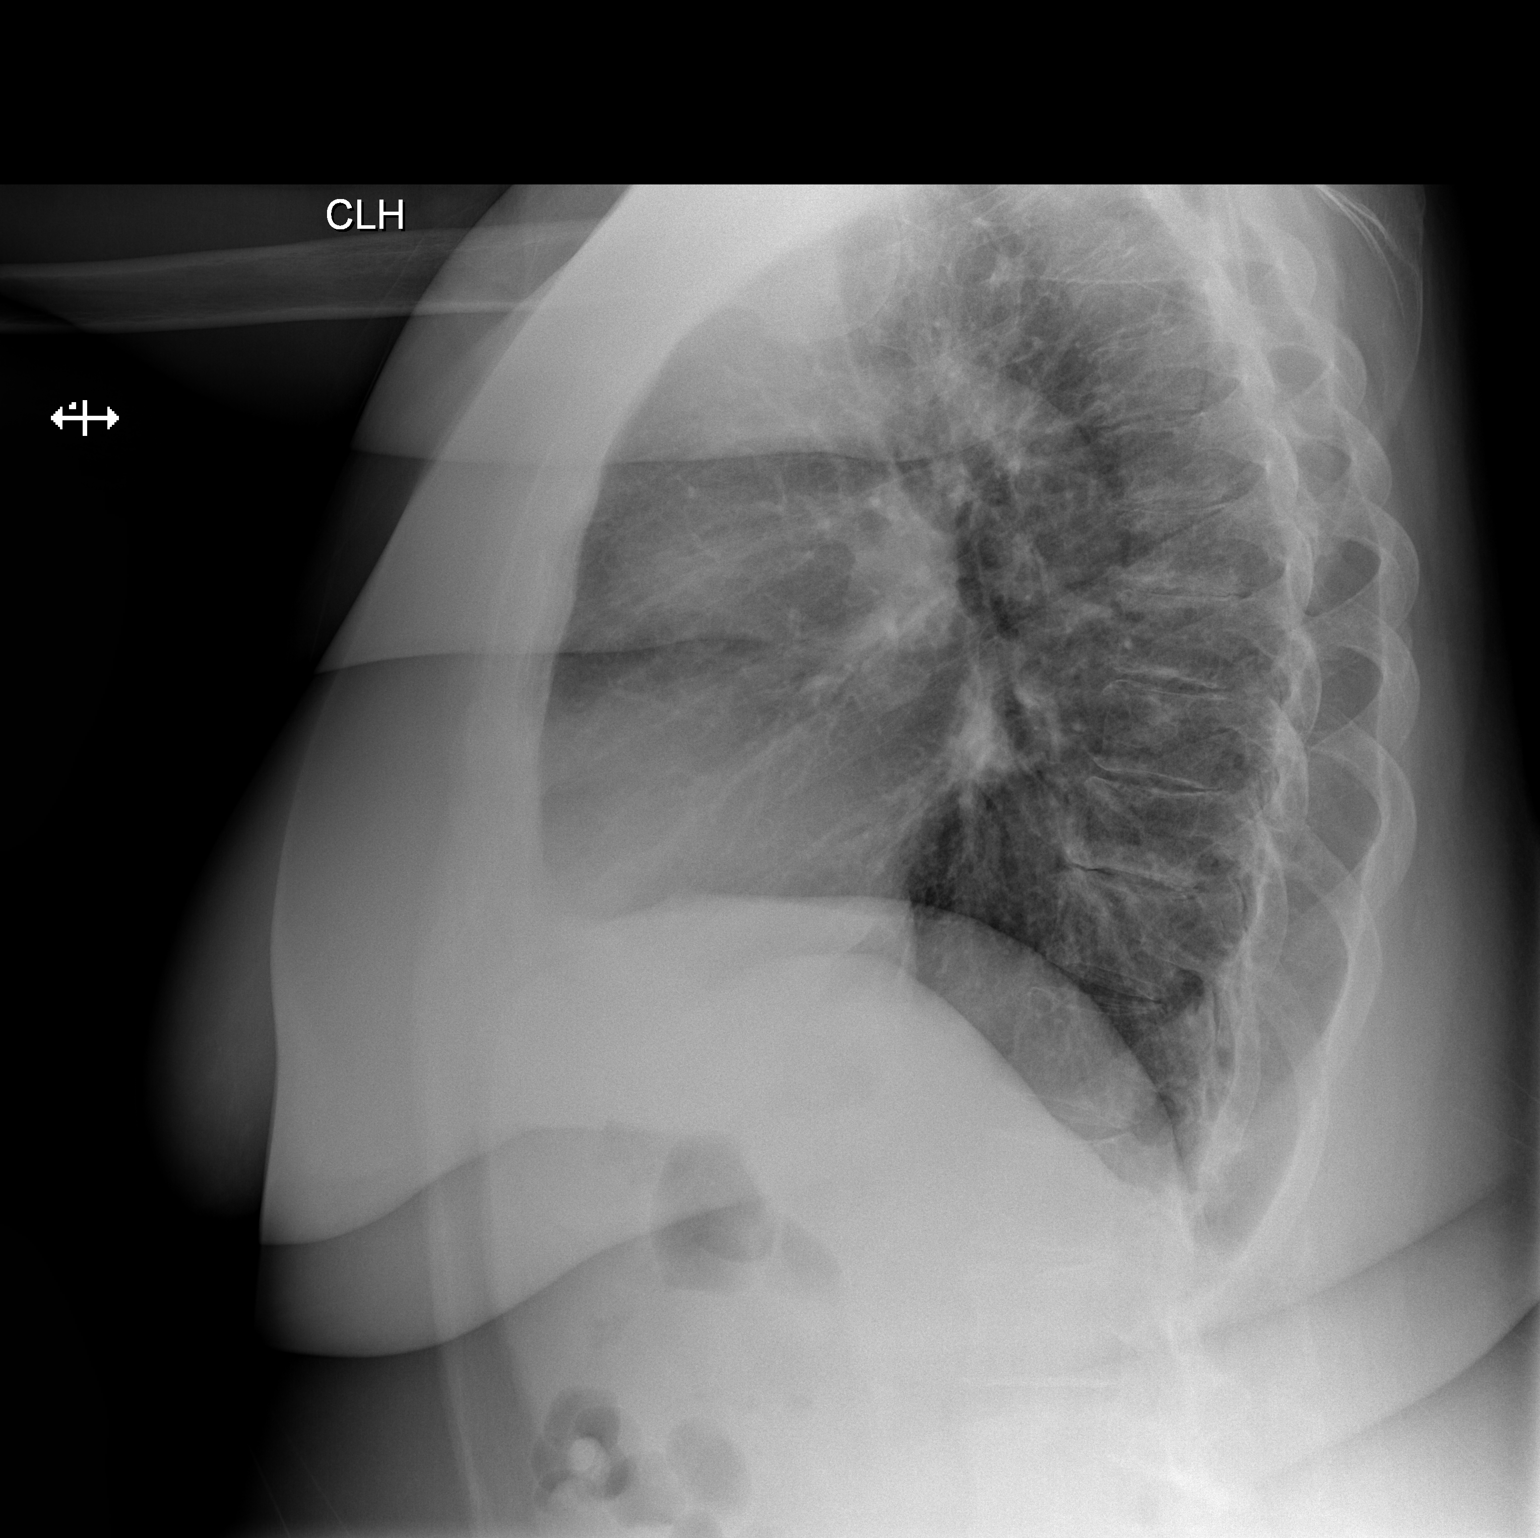

[2 of 2 positions shown; findings below may reference images not displayed]

FINDINGS: The lungs are clear without focal pneumonia, edema, pneumothorax or
pleural effusion. The cardiopericardial silhouette is within normal
limits for size. The visualized bony structures of the thorax are
intact.
IMPRESSION: No active cardiopulmonary disease.

## 2018-09-23 DIAGNOSIS — G4733 Obstructive sleep apnea (adult) (pediatric): Secondary | ICD-10-CM | POA: Diagnosis not present

## 2018-10-09 ENCOUNTER — Encounter: Payer: Self-pay | Admitting: Family Medicine

## 2018-11-09 ENCOUNTER — Other Ambulatory Visit: Payer: Self-pay | Admitting: Family Medicine

## 2018-11-12 ENCOUNTER — Encounter: Payer: Self-pay | Admitting: Family Medicine

## 2018-11-12 ENCOUNTER — Ambulatory Visit (INDEPENDENT_AMBULATORY_CARE_PROVIDER_SITE_OTHER): Payer: Federal, State, Local not specified - PPO | Admitting: Family Medicine

## 2018-11-12 VITALS — BP 131/76 | Temp 98.5°F | Ht 68.0 in | Wt 285.0 lb

## 2018-11-12 DIAGNOSIS — I10 Essential (primary) hypertension: Secondary | ICD-10-CM | POA: Diagnosis not present

## 2018-11-12 DIAGNOSIS — R600 Localized edema: Secondary | ICD-10-CM | POA: Diagnosis not present

## 2018-11-12 DIAGNOSIS — M79672 Pain in left foot: Secondary | ICD-10-CM

## 2018-11-12 MED ORDER — FUROSEMIDE 20 MG PO TABS
20.0000 mg | ORAL_TABLET | Freq: Every day | ORAL | 1 refills | Status: DC | PRN
Start: 2018-11-12 — End: 2019-02-22

## 2018-11-12 NOTE — Progress Notes (Signed)
Feet/ankles are better she stated the swelling has actually gone down. Since she made the appointment but she thought that she would keep the appointment just in case.  She did report that her L little toe was bothering her last night. This was the first time that she has noticed this.  She said it was like a "throb" but not like a pain per se she felt that if she rubbed it that it would go away. She doesn't recall any injury or trauma to the area. It doesn't appear to be red in color. She does report that she walks around her home w/o shoes.   Marland KitchenMaryruth Eve, Lahoma Crocker, CMA

## 2018-11-12 NOTE — Progress Notes (Signed)
Virtual Visit via Video Note  I connected with Rachael Hancock on 11/12/18 at  9:10 AM EDT by a video enabled telemedicine application and verified that I am speaking with the correct person using two identifiers.   I discussed the limitations of evaluation and management by telemedicine and the availability of in person appointments. The patient expressed understanding and agreed to proceed.  Subjective:    CC: ankle pain and swelling  HPI: Says her feet have been swelling on and off for the last couple of years but she feels like it is been more frequent recently.  Though today she says the swelling is almost gone.. When they swell they sting.  Feet/ankles are better she stated the swelling has actually gone down. Since she made the appointment but she thought that she would keep the appointment just in case. No major changes in her diet.  He says she just has not been able to figure out what has triggered it.  She did report that her L little toe at the ball of the foot.  was bothering her last night. This was the first time that she has noticed this. She said it was like a "throb" but not like a pain per se she felt that if she rubbed it that it would go away. She doesn't recall any injury or trauma to the area. It doesn't appear to be red in color. She does report that she walks around her home w/o shoes on hardwood floors.   No chest pain or SOB.   Hypertension- Pt denies chest pain, SOB, dizziness, or heart palpitations.  Taking meds as directed w/o problems.  Denies medication side effects.     Past medical history, Surgical history, Family history not pertinant except as noted below, Social history, Allergies, and medications have been entered into the medical record, reviewed, and corrections made.   Review of Systems: No fevers, chills, night sweats, weight loss, chest pain, or shortness of breath.   Objective:    General: Speaking clearly in complete sentences without any  shortness of breath.  Alert and oriented x3.  Normal judgment. No apparent acute distress. Well groomed.     Impression and Recommendations:    LE edema, bilateral -pain that we typically rule out thyroid disorder, liver dysfunction as well as renal impairment.  She had normal labs in January and all of this looks good.  I would like to rule out proteinuria so we will order a urine microalbumin.  And I would like to check a BNP though she has not had any chest pain or shortness of breath so heart failure is less likely.  Suspect based on her description it is most consistent with venous stasis.  We discussed this potential diagnosis.  Recommend compression stockings as well as as needed Lasix.  Prescription sent to pharmacy.  Encouraged low-salt diet.  Follow-up as needed.  HTN- Well controlled. Continue current regimen. Follow up in  4-5 months.    Left foot pain - possible metatarsalgia based on her description of the foot pain especially with no erythema or swelling to suggest gout or other disorder.  It really sounds like it is at the ball the foot but at the base of the fifth toe.  She has been going barefoot in her house and has hardwood floors the last couple months since [place] is been going on she is mostly been staying in.  Encouraged her to try to start wearing her sneakers around the house  over the next couple weeks for more support and see if the pain resolves on its own.  If it still not improving or if she notices any redness or swelling then please call us back sooner.   I discussed the assessment and treatment plan with the patient. The patient was provided an opportunity to ask questions and all were answered. The patient agreed with the plan and demonstrated an understanding of the instructions.   The patient was advised to call back or seek an in-person evaluation if the symptoms worsen or if the condition fails to improve as anticipated.   Beatrice Lecher, MD

## 2018-12-04 ENCOUNTER — Other Ambulatory Visit: Payer: Self-pay | Admitting: Family Medicine

## 2018-12-04 DIAGNOSIS — R600 Localized edema: Secondary | ICD-10-CM

## 2018-12-04 DIAGNOSIS — I10 Essential (primary) hypertension: Secondary | ICD-10-CM

## 2019-01-06 ENCOUNTER — Encounter: Payer: Self-pay | Admitting: Physician Assistant

## 2019-01-06 ENCOUNTER — Ambulatory Visit: Payer: Federal, State, Local not specified - PPO | Admitting: Physician Assistant

## 2019-01-06 ENCOUNTER — Other Ambulatory Visit: Payer: Self-pay

## 2019-01-06 VITALS — BP 131/71 | HR 80 | Temp 98.7°F | Ht 68.0 in | Wt 300.0 lb

## 2019-01-06 DIAGNOSIS — R600 Localized edema: Secondary | ICD-10-CM | POA: Diagnosis not present

## 2019-01-06 DIAGNOSIS — R109 Unspecified abdominal pain: Secondary | ICD-10-CM

## 2019-01-06 DIAGNOSIS — M549 Dorsalgia, unspecified: Secondary | ICD-10-CM | POA: Diagnosis not present

## 2019-01-06 DIAGNOSIS — I10 Essential (primary) hypertension: Secondary | ICD-10-CM | POA: Diagnosis not present

## 2019-01-06 DIAGNOSIS — R10A2 Flank pain, left side: Secondary | ICD-10-CM

## 2019-01-06 LAB — POCT URINALYSIS DIPSTICK
Bilirubin, UA: NEGATIVE
Glucose, UA: NEGATIVE
Ketones, UA: NEGATIVE
Leukocytes, UA: NEGATIVE
Nitrite, UA: NEGATIVE
Protein, UA: NEGATIVE
Spec Grav, UA: 1.015 (ref 1.010–1.025)
Urobilinogen, UA: 0.2 E.U./dL
pH, UA: 5.5 (ref 5.0–8.0)

## 2019-01-06 MED ORDER — CONTRAVE 8-90 MG PO TB12: ORAL_TABLET | ORAL | 0 refills | Status: DC

## 2019-01-06 MED ORDER — CYCLOBENZAPRINE HCL 10 MG PO TABS: 10.0000 mg | ORAL_TABLET | Freq: Three times a day (TID) | ORAL | 0 refills | Status: DC | PRN

## 2019-01-06 MED ORDER — TAMSULOSIN HCL 0.4 MG PO CAPS: 0.4000 mg | ORAL_CAPSULE | Freq: Every day | ORAL | 0 refills | Status: DC

## 2019-01-06 MED ORDER — AMLODIPINE BESYLATE 5 MG PO TABS: ORAL_TABLET | ORAL | 1 refills | Status: DC

## 2019-01-06 MED ORDER — LISINOPRIL 40 MG PO TABS: ORAL_TABLET | ORAL | 1 refills | Status: DC

## 2019-01-06 NOTE — Progress Notes (Signed)
Subjective:    Patient ID: Rachael Hancock, female    DOB: [birth year], 63 y.o.   MRN: [id number]  HPI  Pt is a 63 yo obese female with HTN and recent hx of lower extermity edema swelling who presents to the clinic with left mid back pain that radiated into left shoulder. She was concerned about "her heart". She denies any CP, SOB. She was seen a few weeks ago for some lower leg edema. Dr. Madilyn Fireman gave her lasix. She used for about 1 week and has not used since. Her legs have not been a problem. She does admit she was working on weight loss and stopped. She has gained 15lbs back since her colonoscopy. She did stop contrave and she was doing so well on it. She would like to restart. She does have hx of kidney stone that had to be surgically removed. She does not report pain like that incident. She does not know of any injury to that area. Yesterday she just noticed mid back pain. Seems to be a little better today. No nausea or fever.   .. Active Ambulatory Problems    Diagnosis Date Noted  . VITAMIN D DEFICIENCY 07/14/2009  . MORBID OBESITY 11/07/2009  . ANXIETY STATE, UNSPECIFIED 11/07/2009  . DEPRESSION, MILD 11/07/2009  . ESSENTIAL HYPERTENSION, BENIGN 08/26/2008  . ALLERGIC RHINITIS 01/13/2009  . GERD 01/13/2009  . SHOULDER PAIN 08/26/2008  . LEG CRAMPS 03/15/2009  . PALPITATIONS 08/01/2009  . OSA (obstructive sleep apnea) 12/23/2011  . Bilateral lower extremity edema 03/21/2015  . 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 Left groin pain 11/09/2017  . Status post total shoulder arthroplasty, 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  . Mid back pain on left side 01/08/2019   Resolved Ambulatory Problems    Diagnosis Date Noted  . LOM 07/14/2009  . LEG PAIN 05/23/2009   Past Medical History:  Diagnosis Date  . Anxiety   . Arthritis   . Complication of anesthesia   . Depression   . GERD (gastroesophageal reflux disease)   . Headache   . Hypertension   .  Reflux   . Sleep apnea        Review of Systems    see HPI.  Objective:   Physical Exam Vitals signs reviewed.  Constitutional:      Appearance: Normal appearance. She is obese.  HENT:     Head: Normocephalic.  Cardiovascular:     Rate and Rhythm: Normal rate and regular rhythm.     Pulses: Normal pulses.     Heart sounds: No murmur.  Pulmonary:     Effort: Pulmonary effort is normal.     Breath sounds: Normal breath sounds.  Abdominal:     General: There is no distension.     Tenderness: There is no abdominal tenderness. There is no right CVA tenderness, left CVA tenderness, guarding or rebound.  Musculoskeletal:     Comments: Normal ROM of left shoulder.  No pain over spine to palpation.  Some tenderness over mid back area to deep palpation over muscles.   Neurological:     General: No focal deficit present.     Mental Status: She is alert and oriented to person, place, and time.  Psychiatric:        Mood and Affect: Mood normal.           Assessment & Plan:  Marland KitchenMarland KitchenToniette was seen today for shoulder pain.  Diagnoses and all orders for this visit:  Mid back pain on left side -     cyclobenzaprine (FLEXERIL) 10 MG tablet; Take 1 tablet (10 mg total) by mouth 3 (three) times daily as needed for muscle spasms.  Left flank pain -     EKG 12-Lead -     POCT Urinalysis Dipstick -     tamsulosin (FLOMAX) 0.4 MG CAPS capsule; Take 1 capsule (0.4 mg total) by mouth daily. For 15 days. -     cyclobenzaprine (FLEXERIL) 10 MG tablet; Take 1 tablet (10 mg total) by mouth 3 (three) times daily as needed for muscle spasms.  MORBID OBESITY -     Naltrexone-buPROPion HCl ER (CONTRAVE) 8-90 MG TB12; 1 tab PO QAM x 1 week, then 1 Tab po BID x 1 week, then 2 tab po QAM with 1 tab po QPM x 1 week, then 2 tabs po BID.  Essential hypertension, benign -     amLODipine (NORVASC) 5 MG tablet; TAKE 1 TABLET BY MOUTH EVERY DAY -     lisinopril (ZESTRIL) 40 MG tablet; TAKE 1 TABLET BY  MOUTH EVERY DAY  .Marland Kitchen Results for orders placed or performed in visit on 01/06/19  POCT Urinalysis Dipstick  Result Value Ref Range   Color, UA yellow    Clarity, UA clear    Glucose, UA Negative Negative   Bilirubin, UA negative    Ketones, UA negative    Spec Grav, UA 1.015 1.010 - 1.025   Blood, UA trace-lysed    pH, UA 5.5 5.0 - 8.0   Protein, UA Negative Negative   Urobilinogen, UA 0.2 0.2 or 1.0 E.U./dL   Nitrite, UA negative    Leukocytes, UA Negative Negative   Appearance     Odor      Unclear etiology of pain. UA did show a trace of blood. Could be a smaller kidney stone starting to move due to hx. Will start flomax for 15 days. Drink loads of water. Follow up as needed. Can use ibuprofen for pain.   EKG was great. No changes from last 06/2018 EKG. NSR. No ST elevation or depression. I do not think this represents cardiac.   Certainly could still be musculoskeletal. Muscle relaxer given. Ibuprofen can help. Alternate heat and ice. Discussed some low back stretches.   BP looks great. Refills given.   Discussed weight gain. Restarted contrave. Follow up with PCP in 1 month to get back on right track.   No lower ext swelling. Only use lasix as needed.   Encouraged to get labs PCP ordered.   Marland Kitchen.Spent 30 minutes with patient and greater than 50 percent of visit spent counseling patient regarding treatment plan.

## 2019-01-07 LAB — COMPLETE METABOLIC PANEL WITH GFR
AG Ratio: 1.6 (calc) (ref 1.0–2.5)
ALT: 22 U/L (ref 6–29)
AST: 15 U/L (ref 10–35)
Albumin: 4.2 g/dL (ref 3.6–5.1)
Alkaline phosphatase (APISO): 82 U/L (ref 37–153)
BUN: 19 mg/dL (ref 7–25)
CO2: 26 mmol/L (ref 20–32)
Calcium: 9.2 mg/dL (ref 8.6–10.4)
Chloride: 108 mmol/L (ref 98–110)
Creat: 0.97 mg/dL (ref 0.50–0.99)
GFR, Est African American: 73 mL/min/{1.73_m2} (ref >=60)
GFR, Est Non African American: 63 mL/min/{1.73_m2} (ref >=60)
Globulin: 2.7 g/dL (calc) (ref 1.9–3.7)
Glucose, Bld: 92 mg/dL (ref 65–99)
Potassium: 4.1 mmol/L (ref 3.5–5.3)
Sodium: 141 mmol/L (ref 135–146)
Total Bilirubin: 0.6 mg/dL (ref 0.2–1.2)
Total Protein: 6.9 g/dL (ref 6.1–8.1)

## 2019-01-07 LAB — LIPID PANEL
Cholesterol: 204 mg/dL — ABNORMAL HIGH (ref <200)
HDL: 59 mg/dL (ref >=50)
LDL Cholesterol (Calc): 128 mg/dL (calc) — ABNORMAL HIGH
Non-HDL Cholesterol (Calc): 145 mg/dL (calc) — ABNORMAL HIGH (ref <130)
Total CHOL/HDL Ratio: 3.5 (calc) (ref <5.0)
Triglycerides: 73 mg/dL (ref <150)

## 2019-01-07 LAB — TSH: TSH: 4.7 mIU/L — ABNORMAL HIGH (ref 0.40–4.50)

## 2019-01-07 LAB — BRAIN NATRIURETIC PEPTIDE: Brain Natriuretic Peptide: 150 pg/mL — ABNORMAL HIGH (ref <100)

## 2019-01-08 DIAGNOSIS — M549 Dorsalgia, unspecified: Secondary | ICD-10-CM | POA: Insufficient documentation

## 2019-01-13 ENCOUNTER — Other Ambulatory Visit: Payer: Self-pay | Admitting: Physician Assistant

## 2019-01-13 ENCOUNTER — Other Ambulatory Visit: Payer: Self-pay | Admitting: Family Medicine

## 2019-01-13 DIAGNOSIS — R0602 Shortness of breath: Secondary | ICD-10-CM

## 2019-01-13 DIAGNOSIS — R109 Unspecified abdominal pain: Secondary | ICD-10-CM

## 2019-01-29 ENCOUNTER — Encounter: Payer: Self-pay | Admitting: Family Medicine

## 2019-02-10 ENCOUNTER — Encounter: Payer: Self-pay | Admitting: Family Medicine

## 2019-02-15 ENCOUNTER — Encounter: Payer: Self-pay | Admitting: Family Medicine

## 2019-02-15 DIAGNOSIS — G4733 Obstructive sleep apnea (adult) (pediatric): Secondary | ICD-10-CM

## 2019-02-16 NOTE — Telephone Encounter (Signed)
[facility] [phone] (on the website it stated that they do sleep apnea treatment.)

## 2019-02-17 ENCOUNTER — Encounter: Payer: Self-pay | Admitting: Family Medicine

## 2019-02-22 ENCOUNTER — Other Ambulatory Visit: Payer: Self-pay | Admitting: Family Medicine

## 2019-02-22 DIAGNOSIS — I10 Essential (primary) hypertension: Secondary | ICD-10-CM

## 2019-02-22 DIAGNOSIS — R600 Localized edema: Secondary | ICD-10-CM

## 2019-02-25 MED ORDER — AMBULATORY NON FORMULARY MEDICATION: 0 refills | Status: DC

## 2019-02-25 NOTE — Telephone Encounter (Signed)
rx printed and placed in provider box for signature

## 2019-02-25 NOTE — Addendum Note (Signed)
Addended by: Alena Bills R on: 02/25/2019 11:55 AM   Modules accepted: Orders

## 2019-03-04 ENCOUNTER — Encounter: Payer: Self-pay | Admitting: Family Medicine

## 2019-03-04 DIAGNOSIS — G4733 Obstructive sleep apnea (adult) (pediatric): Secondary | ICD-10-CM

## 2019-03-04 NOTE — Telephone Encounter (Signed)
Printed and given to [employer]

## 2019-03-04 NOTE — Telephone Encounter (Signed)
Pended an order for Home Sleep Study but unsure if it is the correct order. Please review. Thanks!

## 2019-03-04 NOTE — Telephone Encounter (Signed)
Order signed. oplease print and g give to [facility] since I am not there.

## 2019-03-06 ENCOUNTER — Other Ambulatory Visit: Payer: Self-pay | Admitting: Family Medicine

## 2019-03-06 DIAGNOSIS — R600 Localized edema: Secondary | ICD-10-CM

## 2019-03-06 DIAGNOSIS — I10 Essential (primary) hypertension: Secondary | ICD-10-CM

## 2019-03-12 ENCOUNTER — Encounter: Payer: Self-pay | Admitting: Family Medicine

## 2019-03-12 NOTE — Telephone Encounter (Signed)
Under procedures. Ordered on 9/10. please reprint and give to [facility] to schedule.

## 2019-03-16 ENCOUNTER — Encounter: Payer: Self-pay | Admitting: Family Medicine

## 2019-03-16 NOTE — Telephone Encounter (Signed)
Referral printed and given to referral coordinator for review

## 2019-03-16 NOTE — Telephone Encounter (Signed)
Rachael Hancock, can you print the sleep study (I can't print from Home) and give it to [facility].  I still have not heard back on this issue.

## 2019-04-21 ENCOUNTER — Encounter: Payer: Self-pay | Admitting: Family Medicine

## 2019-04-21 NOTE — Telephone Encounter (Signed)
Called patient's wife, advised her to have patient evaluated IN PERSON sooner rather than later. Patient's wife advised and will take him to be evaluated at Urgent Care

## 2019-04-21 NOTE — Telephone Encounter (Signed)
Done

## 2019-04-26 ENCOUNTER — Encounter: Payer: Self-pay | Admitting: Family Medicine

## 2019-04-26 MED ORDER — ALBUTEROL SULFATE HFA 108 (90 BASE) MCG/ACT IN AERS: 2.0000 | INHALATION_SPRAY | Freq: Four times a day (QID) | RESPIRATORY_TRACT | 1 refills | Status: DC | PRN

## 2019-05-03 ENCOUNTER — Ambulatory Visit (HOSPITAL_BASED_OUTPATIENT_CLINIC_OR_DEPARTMENT_OTHER): Payer: Federal, State, Local not specified - PPO | Attending: Family Medicine | Admitting: Internal Medicine

## 2019-05-03 ENCOUNTER — Other Ambulatory Visit: Payer: Self-pay

## 2019-05-03 VITALS — Ht 68.0 in | Wt 300.0 lb

## 2019-05-03 DIAGNOSIS — G4733 Obstructive sleep apnea (adult) (pediatric): Secondary | ICD-10-CM

## 2019-05-13 ENCOUNTER — Encounter: Payer: Self-pay | Admitting: Family Medicine

## 2019-05-14 ENCOUNTER — Encounter: Payer: Self-pay | Admitting: Family Medicine

## 2019-05-14 ENCOUNTER — Ambulatory Visit (INDEPENDENT_AMBULATORY_CARE_PROVIDER_SITE_OTHER): Payer: Federal, State, Local not specified - PPO | Admitting: Family Medicine

## 2019-05-14 VITALS — BP 122/71 | Temp 98.2°F

## 2019-05-14 DIAGNOSIS — H9202 Otalgia, left ear: Secondary | ICD-10-CM

## 2019-05-14 DIAGNOSIS — K0889 Other specified disorders of teeth and supporting structures: Secondary | ICD-10-CM

## 2019-05-14 DIAGNOSIS — K219 Gastro-esophageal reflux disease without esophagitis: Secondary | ICD-10-CM | POA: Diagnosis not present

## 2019-05-14 MED ORDER — OMEPRAZOLE 40 MG PO CPDR: 40.0000 mg | DELAYED_RELEASE_CAPSULE | Freq: Every day | ORAL | 0 refills | Status: DC

## 2019-05-14 MED ORDER — AMOXICILLIN-POT CLAVULANATE 875-125 MG PO TABS: 1.0000 | ORAL_TABLET | Freq: Two times a day (BID) | ORAL | 0 refills | Status: DC

## 2019-05-14 NOTE — Progress Notes (Signed)
She has been taking sudafed and using a heating pad for this and this has helped some.  She has had some headache in temple behind her eye that has been dull. Scratchy throat eases with something warm to drink. Maryruth Eve, [place]ahoma Crocker, CMA

## 2019-05-14 NOTE — Progress Notes (Signed)
Virtual Visit via Telephone Note  I connected with Rachael Hancock on 05/14/19 at  1:20 PM EST by a video enabled telemedicine application and verified that I am speaking with the correct person using two identifiers.   I discussed the limitations of evaluation and management by telemedicine and the availability of in person appointments. The patient expressed understanding and agreed to proceed.  Subjective:    CC: Sinus sxs.   HPI: Sinus sxs x 1 week.  She says especially it started initially with a headache particularly on the left temple and behind her left eye.  Then it progressed into upper jaw tooth pain.  It was bothersome enough that she actually went to see her dentist about it he did not find any specific causes and felt like it could be coming from her sinuses.  At times she actually feels like her left ear is throbbing into her neck.  She has not had any drainage from the ear.  No fevers, chills, sweats.  She has had some nasal congestion as well as well as some postnasal drip.  She is had a little bit of a scratchy throat just first thing in the morning.  She has not had any known exposures to Covid or anyone has been sick.   She also wanted to discuss her reflux.  She currently takes over-the-counter omeprazole 20 mg and says sometimes it is just not effective and wants to know what her options are.  She has gained some weight since Covid and says she knows she needs to really get back on track with diet and exercise and weight loss.   Past medical history, Surgical history, Family history not pertinant except as noted below, Social history, Allergies, and medications have been entered into the medical record, reviewed, and corrections made.   Review of Systems: No fevers, chills, night sweats, weight loss, chest pain, or shortness of breath.   Objective:    General: Speaking clearly in complete sentences without any shortness of breath.  Alert and oriented x3.  Normal  judgment. No apparent acute distress.    Impression and Recommendations:    Dental pain/left ear pain-suspect sinusitis.  Will treat with Augmentin to cover for otitis media as well as sinusitis.  If not significantly better in 1 week then please let us know.  Recommend symptomatic care.  Certainly she develops any new or worsening symptoms then consider testing for Covid.  GERD-we will increase omeprazole to 40 mg.  Reminded her about reflux diet.  Also encouraged her to work on healthy diet and exercise for weight loss purposes which should improve her reflux as well.     I discussed the assessment and treatment plan with the patient. The patient was provided an opportunity to ask questions and all were answered. The patient agreed with the plan and demonstrated an understanding of the instructions.   The patient was advised to call back or seek an in-person evaluation if the symptoms worsen or if the condition fails to improve as anticipated.  Time spent 16 in non face to face encounter.   Beatrice Lecher, MD

## 2019-05-15 DIAGNOSIS — G4733 Obstructive sleep apnea (adult) (pediatric): Secondary | ICD-10-CM

## 2019-05-15 NOTE — Procedures (Signed)
    Patient Name: Rachael Hancock, Both Date: 05/03/2019 Gender: Female D.O.B: [birth year] Age (years): 63 Referring Provider: Beatrice Lecher Height (inches): 41 Interpreting Physician: Baird Lyons MD, ABSM Weight (lbs): 285 RPSGT: Jacolyn Reedy BMI: 43 MRN: [id number] Neck Size: 16.00  CLINICAL INFORMATION Sleep Study Type: HST Indication for sleep study: OSA Epworth Sleepiness Score: 11  SLEEP STUDY TECHNIQUE A multi-channel overnight portable sleep study was performed. The channels recorded were: nasal airflow, thoracic respiratory movement, and oxygen saturation with a pulse oximetry. Snoring was also monitored.  MEDICATIONS Patient self administered medications include: none reported.  SLEEP ARCHITECTURE Patient was studied for 377.5 minutes. The sleep efficiency was 100.0 % and the patient was supine for 49.9%. The arousal index was 0.0 per hour.  RESPIRATORY PARAMETERS The overall AHI was 6.4 per hour, with a central apnea index of 0.5 per hour. The oxygen nadir was 89% during sleep.  CARDIAC DATA Mean heart rate during sleep was 59.9 bpm.  IMPRESSIONS - Mild obstructive sleep apnea occurred during this study (AHI = 6.4/h). - No significant central sleep apnea occurred during this study (CAI = 0.5/h). - Mild oxygen desaturation was noted during this study (Min O2 = 89%). Mean sat 95%. - Patient snored.  DIAGNOSIS - Obstructive Sleep Apnea (327.23 [G47.33 ICD-10])  RECOMMENDATIONS - Treatment for very mild OSA is directed at symptoms. Conservative measures include observation, weight loss and sleep position off back. - Other options, including CPAP or a fitted oral appliance, would be based on clinical judgment. - Be careful with alcohol, sedatives and other CNS depressants that may worsen sleep apnea and disrupt normal sleep architecture. - Sleep hygiene should be reviewed to assess factors that may improve sleep quality. - Weight management and  regular exercise should be initiated or continued.  [Electronically signed] 05/15/2019 10:37 AM  Baird Lyons MD, ABSM Diplomate, American Board of Sleep Medicine   NPI: [id number]                         [place] [place] [facility]  ELECTRONICALLY SIGNED ON:  05/15/2019, 10:34 AM [place] PH: [phone]   FX: [phone] [place]

## 2019-05-27 ENCOUNTER — Encounter: Payer: Self-pay | Admitting: Family Medicine

## 2019-06-09 ENCOUNTER — Ambulatory Visit (INDEPENDENT_AMBULATORY_CARE_PROVIDER_SITE_OTHER): Payer: Federal, State, Local not specified - PPO | Admitting: Family Medicine

## 2019-06-09 ENCOUNTER — Other Ambulatory Visit: Payer: Self-pay

## 2019-06-09 ENCOUNTER — Encounter: Payer: Self-pay | Admitting: Family Medicine

## 2019-06-09 VITALS — BP 130/76 | HR 83 | Ht 68.0 in | Wt 300.0 lb

## 2019-06-09 DIAGNOSIS — R0789 Other chest pain: Secondary | ICD-10-CM | POA: Diagnosis not present

## 2019-06-09 DIAGNOSIS — E0590 Thyrotoxicosis, unspecified without thyrotoxic crisis or storm: Secondary | ICD-10-CM | POA: Diagnosis not present

## 2019-06-09 DIAGNOSIS — R21 Rash and other nonspecific skin eruption: Secondary | ICD-10-CM

## 2019-06-09 DIAGNOSIS — M25512 Pain in left shoulder: Secondary | ICD-10-CM | POA: Diagnosis not present

## 2019-06-09 MED ORDER — LIDOCAINE 2 % EX GEL: 1.0000 "application " | Freq: Three times a day (TID) | CUTANEOUS | 1 refills | Status: DC | PRN

## 2019-06-09 MED ORDER — VALACYCLOVIR HCL 1 G PO TABS: 1000.0000 mg | ORAL_TABLET | Freq: Three times a day (TID) | ORAL | 0 refills | Status: AC

## 2019-06-09 NOTE — Progress Notes (Signed)
Acute Office Visit  Subjective:    Patient ID: Rachael Hancock, female    DOB: [birth year], 63 y.o.   MRN: [id number]  Chief Complaint  Patient presents with  . torso pain    HPI Patient is in today for left anterior chest wall just below the breast area.  Sometimes it is more towards the axillary mid side area and sometimes a little bit towards her back.  She says she has had this pain on and off for a couple of months but usually it does not last very long but starting on Monday it was actually quite intense to the point where she became tearful and it has been persistent since then though today it is not as intense it is about a 3 out of 5 on a pain scale.  She describes it as a dull ache.  Is not worse with taking a deep breath.  She has noticed that it seems to bother her more with prolonged sitting when she is driving for example.  It is not worse with increased activity.  She does have some occasional mid chest pain but says it does not seem to be related in timing to this particular pain and that has been going on for months.  She does have reflux and talks up the midsternal chest pain more to that.    She does not have any shortness of breath.  No nausea vomiting or change in stools.  No fevers chills or sweats.  She has been using a heating pad on it and says about 2 days ago started to notice a little breakout of a rash underneath the left breast and the main area of discomfort.  She has not noticed any blisters.  She also complains of pain in her left shoulder and arm.  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  She says it feels somewhat similar she describes it as an ache and a soreness in that left shoulder and sometimes even her left elbow will throb.  She says this has been going on for a while to but is been a little bit more persistent over the last 2 weeks.  No injury or trauma known.  Past Medical History:  Diagnosis Date  .  Anxiety   . Arthritis   . Complication of anesthesia    HA  WITH SPINAL, WISDOM TO AWHILE TO WAKE UP WHEN YOUNGER-   . Depression   . GERD (gastroesophageal reflux disease)   . Headache    MIGRAINE  . Hypertension   . Morbid obesity ([place])   . Palpitations   . Reflux   . Sleep apnea    BORDERLINE -  UNABLE TO USE CPAP 4-5 YRS AGO    Past Surgical History:  Procedure Laterality Date  . ANKLE FRACTURE SURGERY    . cyst removed    . KIDNEY STONE SURGERY    . NOVASURE ABLATION    . TOTAL SHOULDER ARTHROPLASTY Right 01/08/2018   Procedure: RIGHT TOTAL SHOULDER ARTHROPLASTY;  Surgeon: Tania Ade, MD;  Location: [facility];  Service: Orthopedics;  Laterality: Right;  . WISDOM TOOTH EXTRACTION      Family History  Problem Relation Age of Onset  . Heart attack Father        at age 38  . Cancer Paternal Grandmother   . Coronary artery disease Unknown   . Arrhythmia Mother        pacemaker insertion  .  Diabetes Maternal Aunt     Social History   Socioeconomic History  . Marital status: Married    Spouse name: Octavia Bruckner  . Number of children: 2  . Years of education: 3 YRS COLL  . Highest education level: Not on file  Occupational History  . Occupation: Barista SERVICE REP    Employer: SELF    Comment: [employer]  Tobacco Use  . Smoking status: Never Smoker  . Smokeless tobacco: Never Used  Substance and Sexual Activity  . Alcohol use: Yes    Alcohol/week: 0.0 standard drinks  . Drug use: No  . Sexual activity: Not on file  Other Topics Concern  . Not on file  Social History Narrative   CUSTOMER SERVICE REP [place]   3 [place] TO [employer]   1 DAUGHTER   Social Determinants of Health   Financial Resource Strain:   . Difficulty of Paying Living Expenses: Not on file  Food Insecurity:   . Worried About Charity fundraiser in the Last Year: Not on file  . Ran Out of Food in the Last Year: Not on file  Transportation Needs:   .  Lack of Transportation (Medical): Not on file  . Lack of Transportation (Non-Medical): Not on file  Physical Activity:   . Days of Exercise per Week: Not on file  . Minutes of Exercise per Session: Not on file  Stress:   . Feeling of Stress : Not on file  Social Connections:   . Frequency of Communication with Friends and Family: Not on file  . Frequency of Social Gatherings with Friends and Family: Not on file  . Attends Religious Services: Not on file  . Active Member of Clubs or Organizations: Not on file  . Attends Archivist Meetings: Not on file  . Marital Status: Not on file  Intimate Partner Violence:   . Fear of Current or Ex-Partner: Not on file  . Emotionally Abused: Not on file  . Physically Abused: Not on file  . Sexually Abused: Not on file    Outpatient Medications Prior to Visit  Medication Sig Dispense Refill  . AMBULATORY NON FORMULARY MEDICATION CPAP machine with supplies. Patient needs nasal mask not facemask. Autopap set to 4-20 cm water pressure.  Dx: OSA - G47.33 1 each 0  . AMBULATORY NON FORMULARY MEDICATION oral appliance therapy - mouthpiece for cpap machine. 1 each 0  . amLODipine (NORVASC) 5 MG tablet TAKE 1 TABLET BY MOUTH EVERY DAY 90 tablet 1  . furosemide (LASIX) 20 MG tablet TAKE 1 TABLET (20 MG TOTAL) BY MOUTH DAILY AS NEEDED FOR EDEMA. TAKE 1 TABLET (20 MG TOTAL) BY MOUTH DAILY. 30 tablet 1  . hydrocortisone (ANUSOL-HC) 2.5 % rectal cream Place rectally 2 (two) times daily. 60 g PRN  . hydrocortisone cream 1 % Apply 1 application topically 2 (two) times daily as needed for itching.    Marland Kitchen lisinopril (ZESTRIL) 40 MG tablet TAKE 1 TABLET BY MOUTH EVERY DAY 90 tablet 1  . loratadine (CLARITIN) 10 MG tablet Take 10 mg by mouth daily.    . MULTIPLE MINERALS-VITAMINS PO Take 1 tablet by mouth 3 (three) times a week.     . Naltrexone-buPROPion HCl ER (CONTRAVE) 8-90 MG TB12 1 tab PO QAM x 1 week, then 1 Tab po BID x 1 week, then 2 tab po QAM with  1 tab po QPM x 1 week, then 2 tabs po BID.  120 tablet 0  . omeprazole (PRILOSEC) 40 MG capsule Take 1 capsule (40 mg total) by mouth daily. 90 capsule 0  . Tetrahydrozoline HCl (VISINE OP) Place 2 drops into both eyes daily as needed (for dry eyes).    Marland Kitchen amoxicillin-clavulanate (AUGMENTIN) 875-125 MG tablet Take 1 tablet by mouth 2 (two) times daily. 14 tablet 0   No facility-administered medications prior to visit.    Allergies  Allergen Reactions  . Verapamil Other (See Comments)    Constipation.     Review of Systems     Objective:    Physical Exam Constitutional:      Appearance: She is well-developed.  HENT:     Head: Normocephalic and atraumatic.     Comments: Left TM and canal is clear.    Nose: Nose normal.  Cardiovascular:     Rate and Rhythm: Normal rate and regular rhythm.     Heart sounds: Normal heart sounds.     Comments: Nontender chest wall.  Nontender over the ribs or axillary region. Pulmonary:     Effort: Pulmonary effort is normal.     Breath sounds: Normal breath sounds.  Musculoskeletal:     Cervical back: No tenderness.     Comments: Left shoulder with normal range of motion.  Negative empty can test.  Nontender on exam.  She has some slight decreased external rotation.  Lymphadenopathy:     Cervical: No cervical adenopathy.  Skin:    General: Skin is warm and dry.     Comments: She has a cluster of papules just underneath that left breast on an erythematous base.  No vesicles.  It does not wrap around to her back.  It is also not at the crease of the breast is just below that.  Neurological:     Mental Status: She is alert and oriented to person, place, and time.  Psychiatric:        Behavior: Behavior normal.     BP 130/76   Pulse 83   Ht 5\' 8"  (1.727 m)   Wt 300 lb (136.1 kg)   SpO2 100%   BMI 45.61 kg/m  Wt Readings from Last 3 Encounters:  06/09/19 300 lb (136.1 kg)  05/03/19 300 lb (136.1 kg)  01/06/19 300 lb (136.1 kg)     Health Maintenance Due  Topic Date Due  . PAP SMEAR-Modifier  07/03/2019    There are no preventive care reminders to display for this patient.   Lab Results  Component Value Date   TSH 4.70 (H) 01/06/2019   Lab Results  Component Value Date   WBC 7.5 07/06/2018   HGB 14.1 07/06/2018   HCT 42.1 07/06/2018   MCV 88.4 07/06/2018   PLT 308 07/06/2018   Lab Results  Component Value Date   NA 141 01/06/2019   K 4.1 01/06/2019   CO2 26 01/06/2019   GLUCOSE 92 01/06/2019   BUN 19 01/06/2019   CREATININE 0.97 01/06/2019   BILITOT 0.6 01/06/2019   ALKPHOS 67 01/01/2018   AST 15 01/06/2019   ALT 22 01/06/2019   PROT 6.9 01/06/2019   ALBUMIN 3.9 01/01/2018   CALCIUM 9.2 01/06/2019   ANIONGAP 8 01/09/2018   Lab Results  Component Value Date   CHOL 204 (H) 01/06/2019   Lab Results  Component Value Date   HDL 59 01/06/2019   Lab Results  Component Value Date   LDLCALC 128 (H) 01/06/2019   Lab Results  Component Value Date  TRIG 73 01/06/2019   Lab Results  Component Value Date   CHOLHDL 3.5 01/06/2019   No results found for: HGBA1C     Assessment & Plan:   Problem List Items Addressed This Visit      Endocrine   Subclinical hyperthyroidism    Monitor again in 6 months.         Other   Left shoulder pain    Unclear etiology.  She does have some limitation with external rotation otherwise normal range of motion.  No significant tenderness on exam.  May be related to current rash in axillary/chest pain which I suspect is shingles.  But if it is not improving then recommend we work-up further.       Other Visit Diagnoses    Atypical chest pain    -  Primary   Relevant Orders   EKG 12-Lead   Rash         Rash-most suspicious for shingles.  I do not see any vesicular lesions that could be swabbed today but there are definitely a cluster of papules with erythematous base.  If they start to show a vesicle then recommend that she come back into that  we can swab it for definitive diagnosis.  Medical ahead and start her on antiviral and have her use topical lidocaine either patches or gel for pain.  If pain worsens please let me know and we can add some alternative treatments.  Atypical chest pain-again I think this is related to possible shingles.  Lung exam is clear, heart exam is normal, EKG is reassuring.  EKG today shows 84 bpm, sinus rhythm with no acute ST-T wave changes.  No change from previous in October.    Meds ordered this encounter  Medications  . valACYclovir (VALTREX) 1000 MG tablet    Sig: Take 1 tablet (1,000 mg total) by mouth 3 (three) times daily for 7 days.    Dispense:  21 tablet    Refill:  0  . Lidocaine 2 % GEL    Sig: Apply 1 application topically 3 (three) times daily as needed. OK to sub OTC similar tx or patches    Dispense:  60 g    Refill:  1     Beatrice Lecher, MD

## 2019-06-09 NOTE — Assessment & Plan Note (Signed)
Unclear etiology.  She does have some limitation with external rotation otherwise normal range of motion.  No significant tenderness on exam.  May be related to current rash in axillary/chest pain which I suspect is shingles.  But if it is not improving then recommend we work-up further.

## 2019-06-09 NOTE — Patient Instructions (Addendum)
Call if not improving over the next week.   Ok to use heat or ice which ever feels better.  Shingles  Shingles is an infection. It gives you a painful skin rash and blisters that have fluid in them. Shingles is caused by the same germ (virus) that causes chickenpox. Shingles only happens in people who:  Have had chickenpox.  Have been given a shot of medicine (vaccine) to protect against chickenpox. Shingles is rare in this group. The first symptoms of shingles may be itching, tingling, or pain in an area on your skin. A rash will show on your skin a few days or weeks later. The rash is likely to be on one side of your body. The rash usually has a shape like a belt or a band. Over time, the rash turns into fluid-filled blisters. The blisters will break open, change into scabs, and dry up. Medicines may:  Help with pain and itching.  Help you get better sooner.  Help to prevent long-term problems. Follow these instructions at home: Medicines  Take over-the-counter and prescription medicines only as told by your doctor.  Put on an anti-itch cream or numbing cream where you have a rash, blisters, or scabs. Do this as told by your doctor. Helping with itching and discomfort   Put cold, wet cloths (cold compresses) on the area of the rash or blisters as told by your doctor.  Cool baths can help you feel better. Try adding baking soda or dry oatmeal to the water to lessen itching. Do not bathe in hot water. Blister and rash care  Keep your rash covered with a loose bandage (dressing).  Wear loose clothing that does not rub on your rash.  Keep your rash and blisters clean. To do this, wash the area with mild soap and cool water as told by your doctor.  Check your rash every day for signs of infection. Check for: ? More redness, swelling, or pain. ? Fluid or blood. ? Warmth. ? Pus or a bad smell.  Do not scratch your rash. Do not pick at your blisters. To help you to not  scratch: ? Keep your fingernails clean and cut short. ? Wear gloves or mittens when you sleep, if scratching is a problem. General instructions  Rest as told by your doctor.  Keep all follow-up visits as told by your doctor. This is important.  Wash your hands often with soap and water. If soap and water are not available, use hand sanitizer. Doing this lowers your chance of getting a skin infection caused by germs (bacteria).  Your infection can cause chickenpox in people who have never had chickenpox or never got a shot of chickenpox vaccine. If you have blisters that did not change into scabs yet, try not to touch other people or be around other people, especially: ? Babies. ? Pregnant women. ? Children who have areas of red, itchy, or rough skin (eczema). ? Very old people who have transplants. ? People who have a long-term (chronic) sickness, like cancer or AIDS. Contact a doctor if:  Your pain does not get better with medicine.  Your pain does not get better after the rash heals.  You have any signs of infection in the rash area. These signs include: ? More redness, swelling, or pain around the rash. ? Fluid or blood coming from the rash. ? The rash area feeling warm to the touch. ? Pus or a bad smell coming from the rash. Get help right  away if:  The rash is on your face or nose.  You have pain in your face or pain by your eye.  You lose feeling on one side of your face.  You have trouble seeing.  You have ear pain, or you have ringing in your ear.  You have a loss of taste.  Your condition gets worse. Summary  Shingles gives you a painful skin rash and blisters that have fluid in them.  Shingles is an infection. It is caused by the same germ (virus) that causes chickenpox.  Keep your rash covered with a loose bandage (dressing). Wear loose clothing that does not rub on your rash.  If you have blisters that did not change into scabs yet, try not to touch  other people or be around people. This information is not intended to replace advice given to you by your health care provider. Make sure you discuss any questions you have with your health care provider. Document Released: 11/27/2007 Document Revised: 10/02/2018 Document Reviewed: 02/12/2017 Elsevier Patient Education  2020 [employer].

## 2019-06-09 NOTE — Assessment & Plan Note (Signed)
Monitor again in 6 months.

## 2019-06-11 ENCOUNTER — Encounter: Payer: Self-pay | Admitting: Family Medicine

## 2019-06-14 ENCOUNTER — Encounter: Payer: Self-pay | Admitting: Family Medicine

## 2019-06-14 NOTE — Telephone Encounter (Signed)
Please review this and previous MyChart note

## 2019-07-20 ENCOUNTER — Encounter: Payer: Self-pay | Admitting: Family Medicine

## 2019-07-25 ENCOUNTER — Other Ambulatory Visit: Payer: Self-pay | Admitting: Physician Assistant

## 2019-07-25 DIAGNOSIS — I10 Essential (primary) hypertension: Secondary | ICD-10-CM

## 2019-07-28 DIAGNOSIS — G4733 Obstructive sleep apnea (adult) (pediatric): Secondary | ICD-10-CM | POA: Diagnosis not present

## 2019-08-02 DIAGNOSIS — Z1231 Encounter for screening mammogram for malignant neoplasm of breast: Secondary | ICD-10-CM | POA: Diagnosis not present

## 2019-08-11 DIAGNOSIS — R928 Other abnormal and inconclusive findings on diagnostic imaging of breast: Secondary | ICD-10-CM | POA: Diagnosis not present

## 2019-08-16 ENCOUNTER — Other Ambulatory Visit: Payer: Self-pay | Admitting: Family Medicine

## 2019-08-30 ENCOUNTER — Encounter: Payer: Self-pay | Admitting: Family Medicine

## 2019-08-30 DIAGNOSIS — I499 Cardiac arrhythmia, unspecified: Secondary | ICD-10-CM

## 2019-08-30 DIAGNOSIS — R0789 Other chest pain: Secondary | ICD-10-CM

## 2019-08-30 NOTE — Telephone Encounter (Signed)
Forwarding to provider for recommendation. Pls send response to [facility]. Thanks.

## 2019-08-30 NOTE — Telephone Encounter (Signed)
Forwarding to provider.

## 2019-08-31 NOTE — Telephone Encounter (Signed)
Faxed order to [facility] cards so they can place monitor for patient Fax [phone] and Ph is [phone]

## 2019-09-02 ENCOUNTER — Encounter: Payer: Self-pay | Admitting: Family Medicine

## 2019-09-07 NOTE — Progress Notes (Signed)
Referring-Rachael Metheney, MD Reason for referral-chest pain and palpitations  HPI: 64 year old female for evaluation of chest pain and palpitations at request of Rachael Lecher, MD.  Patient seen previously but not since 2013.  Previous monitors showed sinus with PACs and PVCs.  Echocardiogram March 2012 showed normal LV systolic function, grade 1 diastolic dysfunction and mild biatrial enlargement.  Patient recently seen by her dental hygienist and told she had an irregular heartbeat.  She has occasional brief flutters but no sustained palpitations.  She has some dyspnea on exertion but no orthopnea, PND, pedal edema or syncope.  She has had pain in the left lateral rib area.  It can be present for 48 hours at a time.  Improves with certain changes in positions.  She does not have exertional chest pain.  Cardiology now asked to evaluate.  Current Outpatient Medications  Medication Sig Dispense Refill  . AMBULATORY NON FORMULARY MEDICATION oral appliance therapy - mouthpiece for cpap machine. 1 each 0  . amLODipine (NORVASC) 5 MG tablet TAKE 1 TABLET BY MOUTH EVERY DAY/ NEEDS APPT/LABS 90 tablet 0  . furosemide (LASIX) 20 MG tablet TAKE 1 TABLET (20 MG TOTAL) BY MOUTH DAILY AS NEEDED FOR EDEMA. TAKE 1 TABLET (20 MG TOTAL) BY MOUTH DAILY. 30 tablet 1  . hydrocortisone (ANUSOL-HC) 2.5 % rectal cream Place rectally 2 (two) times daily. (Patient taking differently: Place rectally 2 (two) times daily as needed. ) 60 g PRN  . hydrocortisone cream 1 % Apply 1 application topically 2 (two) times daily as needed for itching.    . Lidocaine 2 % GEL Apply 1 application topically 3 (three) times daily as needed. OK to sub OTC similar tx or patches 60 g 1  . lisinopril (ZESTRIL) 40 MG tablet TAKE 1 TABLET BY MOUTH EVERY DAY/ NEEDS APPT/LABS 90 tablet 0  . loratadine (CLARITIN) 10 MG tablet Take 10 mg by mouth daily.    . MULTIPLE MINERALS-VITAMINS PO Take 1 tablet by mouth 3 (three) times a week.       . Naltrexone-buPROPion HCl ER (CONTRAVE) 8-90 MG TB12 1 tab PO QAM x 1 week, then 1 Tab po BID x 1 week, then 2 tab po QAM with 1 tab po QPM x 1 week, then 2 tabs po BID. 120 tablet 0  . omeprazole (PRILOSEC) 40 MG capsule TAKE 1 CAPSULE BY MOUTH EVERY DAY 90 capsule 3  . Tetrahydrozoline HCl (VISINE OP) Place 2 drops into both eyes daily as needed (for dry eyes).     No current facility-administered medications for this visit.    Allergies  Allergen Reactions  . Verapamil Other (See Comments)    Constipation.      Past Medical History:  Diagnosis Date  . Anxiety   . Arthritis   . Complication of anesthesia    HA  WITH SPINAL, WISDOM TO AWHILE TO WAKE UP WHEN YOUNGER-   . Depression   . GERD (gastroesophageal reflux disease)   . Headache    MIGRAINE  . Hypertension   . Morbid obesity ([place])   . Palpitations   . Reflux   . Sleep apnea    BORDERLINE -  UNABLE TO USE CPAP 4-5 YRS AGO    Past Surgical History:  Procedure Laterality Date  . ANKLE FRACTURE SURGERY    . cyst removed    . KIDNEY STONE SURGERY    . NOVASURE ABLATION    . TOTAL SHOULDER ARTHROPLASTY Right 01/08/2018   Procedure: RIGHT  TOTAL SHOULDER ARTHROPLASTY;  Surgeon: Tania Ade, MD;  Location: [facility];  Service: Orthopedics;  Laterality: Right;  . WISDOM TOOTH EXTRACTION      Social History   Socioeconomic History  . Marital status: Married    Spouse name: Octavia Bruckner  . Number of children: 2  . Years of education: 3 YRS COLL  . Highest education level: Not on file  Occupational History  . Occupation: Barista SERVICE REP    Employer: SELF    Comment: [employer]  Tobacco Use  . Smoking status: Never Smoker  . Smokeless tobacco: Never Used  Substance and Sexual Activity  . Alcohol use: Yes    Alcohol/week: 0.0 standard drinks    Comment: Rare  . Drug use: No  . Sexual activity: Not on file  Other Topics Concern  . Not on file  Social History Narrative   CUSTOMER SERVICE REP  [place]   3 [place] TO [employer]   1 DAUGHTER   Social Determinants of Health   Financial Resource Strain:   . Difficulty of Paying Living Expenses:   Food Insecurity:   . Worried About Charity fundraiser in the Last Year:   . Arboriculturist in the Last Year:   Transportation Needs:   . Film/video editor (Medical):   Marland Kitchen Lack of Transportation (Non-Medical):   Physical Activity:   . Days of Exercise per Week:   . Minutes of Exercise per Session:   Stress:   . Feeling of Stress :   Social Connections:   . Frequency of Communication with Friends and Family:   . Frequency of Social Gatherings with Friends and Family:   . Attends Religious Services:   . Active Member of Clubs or Organizations:   . Attends Archivist Meetings:   Marland Kitchen Marital Status:   Intimate Partner Violence:   . Fear of Current or Ex-Partner:   . Emotionally Abused:   Marland Kitchen Physically Abused:   . Sexually Abused:     Family History  Problem Relation Age of Onset  . Heart attack Father        at age 29  . Cancer Paternal Grandmother   . Coronary artery disease Other   . Arrhythmia Mother        pacemaker insertion  . Diabetes Maternal Aunt     ROS: no fevers or chills, productive cough, hemoptysis, dysphasia, odynophagia, melena, hematochezia, dysuria, hematuria, rash, seizure activity, orthopnea, PND, pedal edema, claudication. Remaining systems are negative.  Physical Exam:   Blood pressure 130/82, pulse 80, height 5\' 8"  (1.727 m), weight (!) 312 lb 6.4 oz (141.7 kg).  General:  Well developed/obese in NAD Skin warm/dry Patient not depressed No peripheral clubbing Back-normal HEENT-normal/normal eyelids Neck supple/normal carotid upstroke bilaterally; no bruits; no JVD; no thyromegaly chest - CTA/ normal expansion CV - RRR/normal S1 and S2; no murmurs, rubs or gallops;  PMI nondisplaced Abdomen -NT/ND, no HSM, no mass, + bowel sounds, no bruit 2+ femoral  pulses, no bruits Ext-no edema, chords, 2+ DP Neuro-grossly nonfocal  ECG - 06/09/19-sinus rhythm with no ST changes.  Personally reviewed Electrocardiogram today personally reviewed and shows sinus rhythm with no ST changes.  A/P  1 chest pain-symptoms very atypical and not consistent with cardiac etiology.  Electrocardiogram shows no ST changes.  She does have a family history of coronary disease with her father having a myocardial infarction in his 55s.  I will arrange  a calcium score for risk stratification.  2 palpitations-we will repeat echocardiogram.  Symptoms do not appear to be significantly bothersome at this point.  We will consider a monitor in the future if her symptoms worsen.  3 hypertension-blood pressure controlled.  Continue present medications.  Kirk Ruths, MD

## 2019-09-09 ENCOUNTER — Ambulatory Visit: Payer: Federal, State, Local not specified - PPO | Attending: Internal Medicine

## 2019-09-09 DIAGNOSIS — Z23 Encounter for immunization: Secondary | ICD-10-CM

## 2019-09-09 NOTE — Progress Notes (Signed)
   Covid-19 Vaccination Clinic  Name:  Finola Statzer    MRN: [id number] DOB: [birth year]  09/09/2019  Ms. Purdom was observed post Covid-19 immunization for 15 minutes without incident. She was provided with Vaccine Information Sheet and instruction to access the V-Safe system.   Ms. Grapes was instructed to call 911 with any severe reactions post vaccine: Marland Kitchen Difficulty breathing  . Swelling of face and throat  . A fast heartbeat  . A bad rash all over body  . Dizziness and weakness   Immunizations Administered    Name Date Dose VIS Date Route   Pfizer COVID-19 Vaccine 09/09/2019  4:17 PM 0.3 mL 06/04/2019 Intramuscular   Manufacturer: [place]   Lot: [id number]   [place]: [id number]

## 2019-09-15 ENCOUNTER — Other Ambulatory Visit: Payer: Self-pay

## 2019-09-15 ENCOUNTER — Ambulatory Visit: Payer: Federal, State, Local not specified - PPO | Admitting: Cardiology

## 2019-09-15 ENCOUNTER — Encounter: Payer: Self-pay | Admitting: Cardiology

## 2019-09-15 VITALS — BP 130/82 | HR 80 | Ht 68.0 in | Wt 312.4 lb

## 2019-09-15 DIAGNOSIS — I10 Essential (primary) hypertension: Secondary | ICD-10-CM | POA: Diagnosis not present

## 2019-09-15 DIAGNOSIS — R002 Palpitations: Secondary | ICD-10-CM | POA: Diagnosis not present

## 2019-09-15 DIAGNOSIS — R072 Precordial pain: Secondary | ICD-10-CM

## 2019-09-15 NOTE — Patient Instructions (Signed)
Medication Instructions:  NO CHANGE *If you need a refill on your cardiac medications before your next appointment, please call your pharmacy*   Lab Work: If you have labs (blood work) drawn today and your tests are completely normal, you will receive your results only by: Marland Kitchen MyChart Message (if you have MyChart) OR . A paper copy in the mail If you have any lab test that is abnormal or we need to change your treatment, we will call you to review the results.   Testing/Procedures: Your physician has requested that you have an echocardiogram. Echocardiography is a painless test that uses sound waves to create images of your heart. It provides your doctor with information about the size and shape of your heart and how well your heart's chambers and valves are working. This procedure takes approximately one hour. There are no restrictions for this procedure.[place] [facility] [place]   Follow-Up: At [facility], you and your health needs are our priority.  As part of our continuing mission to provide you with exceptional heart care, we have created designated Provider Care Teams.  These Care Teams include your primary Cardiologist (physician) and Advanced Practice Providers (APPs -  Physician Assistants and Nurse Practitioners) who all work together to provide you with the care you need, when you need it.  We recommend signing up for the patient portal called "MyChart".  Sign up information is provided on this After Visit Summary.  MyChart is used to connect with patients for Virtual Visits (Telemedicine).  Patients are able to view lab/test results, encounter notes, upcoming appointments, etc.  Non-urgent messages can be sent to your provider as well.   To learn more about what you can do with MyChart, go to [email or web address].    Your next appointment:   6 month(s)  The format for your next appointment:   Either In Person or  Virtual  Provider:   Kirk Ruths, MD

## 2019-09-23 ENCOUNTER — Other Ambulatory Visit: Payer: Self-pay

## 2019-09-23 ENCOUNTER — Ambulatory Visit (INDEPENDENT_AMBULATORY_CARE_PROVIDER_SITE_OTHER)
Admission: RE | Admit: 2019-09-23 | Discharge: 2019-09-23 | Disposition: A | Discharge status: Home / Self Care | Payer: Self-pay | Source: Ambulatory Visit | Attending: Cardiology | Admitting: Cardiology

## 2019-09-23 ENCOUNTER — Ambulatory Visit (HOSPITAL_COMMUNITY): Payer: Federal, State, Local not specified - PPO | Attending: Internal Medicine

## 2019-09-23 DIAGNOSIS — R002 Palpitations: Secondary | ICD-10-CM | POA: Insufficient documentation

## 2019-09-23 DIAGNOSIS — R072 Precordial pain: Secondary | ICD-10-CM

## 2019-09-24 ENCOUNTER — Telehealth: Payer: Self-pay | Admitting: *Deleted

## 2019-09-24 DIAGNOSIS — R931 Abnormal findings on diagnostic imaging of heart and coronary circulation: Secondary | ICD-10-CM

## 2019-09-24 MED ORDER — ROSUVASTATIN CALCIUM 20 MG PO TABS: 20.0000 mg | ORAL_TABLET | Freq: Every day | ORAL | 3 refills | Status: DC

## 2019-09-24 NOTE — Telephone Encounter (Addendum)
pt aware of results  New script sent to the pharmacy  Lab orders mailed to the pt    ----- Message from Lelon Perla, MD sent at 09/23/2019  4:51 PM EDT ----- Ca score mildly elevated; add crestor 20 mg daily; lipids and liver 12 weeks Kirk Ruths

## 2019-10-01 ENCOUNTER — Other Ambulatory Visit: Payer: Self-pay | Admitting: Family Medicine

## 2019-10-01 DIAGNOSIS — I10 Essential (primary) hypertension: Secondary | ICD-10-CM

## 2019-10-01 DIAGNOSIS — R600 Localized edema: Secondary | ICD-10-CM

## 2019-10-04 ENCOUNTER — Ambulatory Visit: Payer: Federal, State, Local not specified - PPO | Attending: Internal Medicine

## 2019-10-04 DIAGNOSIS — Z23 Encounter for immunization: Secondary | ICD-10-CM

## 2019-10-04 NOTE — Progress Notes (Signed)
   Covid-19 Vaccination Clinic  Name:  Rachael Hancock    MRN: [id number] DOB: 15-Dec-1955  10/04/2019  Rachael Hancock was observed post Covid-19 immunization for 15 minutes without incident. She was provided with Vaccine Information Sheet and instruction to access the V-Safe system.   Rachael Hancock was instructed to call 911 with any severe reactions post vaccine: Marland Kitchen Difficulty breathing  . Swelling of face and throat  . A fast heartbeat  . A bad rash all over body  . Dizziness and weakness   Immunizations Administered    Name Date Dose VIS Date Route   Pfizer COVID-19 Vaccine 10/04/2019  4:03 PM 0.3 mL 06/04/2019 Intramuscular   Manufacturer: [place]   Lot: [id number]   [place]: [id number]

## 2019-10-14 ENCOUNTER — Other Ambulatory Visit: Payer: Self-pay | Admitting: Family Medicine

## 2019-10-14 DIAGNOSIS — I10 Essential (primary) hypertension: Secondary | ICD-10-CM

## 2019-10-14 DIAGNOSIS — R600 Localized edema: Secondary | ICD-10-CM

## 2019-10-30 ENCOUNTER — Other Ambulatory Visit: Payer: Self-pay | Admitting: Family Medicine

## 2019-10-30 DIAGNOSIS — I10 Essential (primary) hypertension: Secondary | ICD-10-CM

## 2019-12-13 DIAGNOSIS — G4733 Obstructive sleep apnea (adult) (pediatric): Secondary | ICD-10-CM | POA: Diagnosis not present

## 2020-01-14 ENCOUNTER — Other Ambulatory Visit: Payer: Self-pay | Admitting: Family Medicine

## 2020-01-14 DIAGNOSIS — R600 Localized edema: Secondary | ICD-10-CM

## 2020-01-14 DIAGNOSIS — I10 Essential (primary) hypertension: Secondary | ICD-10-CM

## 2020-01-25 ENCOUNTER — Encounter: Payer: Self-pay | Admitting: *Deleted

## 2020-02-10 ENCOUNTER — Other Ambulatory Visit: Payer: Self-pay | Admitting: Family Medicine

## 2020-02-10 DIAGNOSIS — I10 Essential (primary) hypertension: Secondary | ICD-10-CM

## 2020-02-11 ENCOUNTER — Encounter: Payer: Self-pay | Admitting: *Deleted

## 2020-02-11 ENCOUNTER — Encounter: Payer: Self-pay | Admitting: Family Medicine

## 2020-02-11 ENCOUNTER — Other Ambulatory Visit: Payer: Self-pay | Admitting: *Deleted

## 2020-02-14 ENCOUNTER — Other Ambulatory Visit: Payer: Self-pay | Admitting: *Deleted

## 2020-02-14 DIAGNOSIS — R600 Localized edema: Secondary | ICD-10-CM

## 2020-02-14 DIAGNOSIS — I10 Essential (primary) hypertension: Secondary | ICD-10-CM

## 2020-02-15 DIAGNOSIS — Z01419 Encounter for gynecological examination (general) (routine) without abnormal findings: Secondary | ICD-10-CM | POA: Diagnosis not present

## 2020-02-15 DIAGNOSIS — R002 Palpitations: Secondary | ICD-10-CM | POA: Diagnosis not present

## 2020-02-15 DIAGNOSIS — R600 Localized edema: Secondary | ICD-10-CM | POA: Diagnosis not present

## 2020-02-15 DIAGNOSIS — R931 Abnormal findings on diagnostic imaging of heart and coronary circulation: Secondary | ICD-10-CM | POA: Diagnosis not present

## 2020-02-15 DIAGNOSIS — I10 Essential (primary) hypertension: Secondary | ICD-10-CM | POA: Diagnosis not present

## 2020-02-15 DIAGNOSIS — Z6841 Body Mass Index (BMI) 40.0 and over, adult: Secondary | ICD-10-CM | POA: Diagnosis not present

## 2020-02-15 LAB — HM PAP SMEAR: HM Pap smear: NEGATIVE

## 2020-02-16 LAB — HEPATIC FUNCTION PANEL
ALT: 16 IU/L (ref 0–32)
AST: 17 IU/L (ref 0–40)
Albumin: 4.4 g/dL (ref 3.8–4.8)
Alkaline Phosphatase: 84 IU/L (ref 48–121)
Bilirubin Total: 0.5 mg/dL (ref 0.0–1.2)
Bilirubin, Direct: 0.19 mg/dL (ref 0.00–0.40)
Total Protein: 6.9 g/dL (ref 6.0–8.5)

## 2020-02-16 LAB — LIPID PANEL
Chol/HDL Ratio: 2.5 ratio (ref 0.0–4.4)
Cholesterol, Total: 135 mg/dL (ref 100–199)
HDL: 54 mg/dL (ref >39)
LDL Chol Calc (NIH): 65 mg/dL (ref 0–99)
Triglycerides: 81 mg/dL (ref 0–149)
VLDL Cholesterol Cal: 16 mg/dL (ref 5–40)

## 2020-02-16 LAB — CMP14+EGFR
ALT: 14 IU/L (ref 0–32)
AST: 15 IU/L (ref 0–40)
Albumin/Globulin Ratio: 1.4 (ref 1.2–2.2)
Albumin: 4.1 g/dL (ref 3.8–4.8)
Alkaline Phosphatase: 86 IU/L (ref 48–121)
BUN/Creatinine Ratio: 18 (ref 12–28)
BUN: 16 mg/dL (ref 8–27)
Bilirubin Total: 0.5 mg/dL (ref 0.0–1.2)
CO2: 23 mmol/L (ref 20–29)
Calcium: 8.8 mg/dL (ref 8.7–10.3)
Chloride: 105 mmol/L (ref 96–106)
Creatinine, Ser: 0.87 mg/dL (ref 0.57–1.00)
GFR calc Af Amer: 83 mL/min/{1.73_m2} (ref >59)
GFR calc non Af Amer: 72 mL/min/{1.73_m2} (ref >59)
Globulin, Total: 3 g/dL (ref 1.5–4.5)
Glucose: 88 mg/dL (ref 65–99)
Potassium: 3.9 mmol/L (ref 3.5–5.2)
Sodium: 140 mmol/L (ref 134–144)
Total Protein: 7.1 g/dL (ref 6.0–8.5)

## 2020-02-16 LAB — TSH: TSH: 5.39 u[IU]/mL — ABNORMAL HIGH (ref 0.450–4.500)

## 2020-02-17 ENCOUNTER — Other Ambulatory Visit: Payer: Self-pay | Admitting: Family Medicine

## 2020-02-17 DIAGNOSIS — I10 Essential (primary) hypertension: Secondary | ICD-10-CM

## 2020-02-17 DIAGNOSIS — R600 Localized edema: Secondary | ICD-10-CM

## 2020-03-08 NOTE — Progress Notes (Signed)
Acute Office Visit  Subjective:    Patient ID: Rachael Hancock, female    DOB: 10-23-1955, 64 y.o.   MRN: [id number]  Chief Complaint  Patient presents with  . Leg Pain    HPI Patient is in today for throbbing left leg pain.  She says she would get this from time to time occasionally especially with prolonged standing etc.  But over the last several days the discomfort has been persistent and does not go away with just resting the leg.  Says it burns on the front and side of left lower leg.  Pain in intermittant.  Says it does not hurt right now she almost canceled the appointment but again it seems to be coming and going.  Had sinus pressure in her face and nose and her left ear feels painful.  Symptoms have been present for about 4 days.  No fevers chills or sweats.  She feels like she is congested and like there is mucus stuck but nothing is actually coming out.  No pain with swallowing.  No GI symptoms.  Her daughter was recently seen for dry socket and sinusitis.  She says she really stays at home most of the time and does not really get out.  He does typically have fall allergies but says this is a little early she does not usually get symptoms until about October though she has been taking her Claritin regularly.  Past Medical History:  Diagnosis Date  . Anxiety   . Arthritis   . Complication of anesthesia    HA  WITH SPINAL, WISDOM TO AWHILE TO WAKE UP WHEN YOUNGER-   . Depression   . GERD (gastroesophageal reflux disease)   . Headache    MIGRAINE  . Hypertension   . Morbid obesity ([place])   . Palpitations   . Reflux   . Sleep apnea    BORDERLINE -  UNABLE TO USE CPAP 4-5 YRS AGO    Past Surgical History:  Procedure Laterality Date  . ANKLE FRACTURE SURGERY    . cyst removed    . KIDNEY STONE SURGERY    . NOVASURE ABLATION    . TOTAL SHOULDER ARTHROPLASTY Right 01/08/2018   Procedure: RIGHT TOTAL SHOULDER ARTHROPLASTY;  Surgeon: Tania Ade, MD;  Location: [facility];  Service: Orthopedics;  Laterality: Right;  . WISDOM TOOTH EXTRACTION      Family History  Problem Relation Age of Onset  . Heart attack Father        at age 44  . Cancer Paternal Grandmother   . Coronary artery disease Other   . Arrhythmia Mother        pacemaker insertion  . Diabetes Maternal Aunt     Social History   Socioeconomic History  . Marital status: Married    Spouse name: Octavia Bruckner  . Number of children: 2  . Years of education: 3 YRS COLL  . Highest education level: Not on file  Occupational History  . Occupation: Barista SERVICE REP    Employer: SELF    Comment: [employer]  Tobacco Use  . Smoking status: Never Smoker  . Smokeless tobacco: Never Used  Vaping Use  . Vaping Use: Never used  Substance and Sexual Activity  . Alcohol use: Yes    Alcohol/week: 0.0 standard drinks    Comment: Rare  . Drug use: No  . Sexual activity: Not on file  Other Topics Concern  . 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 Difficulty of Paying Living Expenses: Not on file  Food Insecurity:   . Worried About Charity fundraiser in the Last Year: Not on file  . Ran Out of Food in the Last Year: Not on file  Transportation Needs:   . Lack of Transportation (Medical): Not on file  . Lack of Transportation (Non-Medical): Not on file  Physical Activity:   . Days of Exercise per Week: Not on file  . Minutes of Exercise per Session: Not on file  Stress:   . Feeling of Stress : Not on file  Social Connections:   . Frequency of Communication with Friends and Family: Not on file  . Frequency of Social Gatherings with Friends and Family: Not on file  . Attends Religious Services: Not on file  . Active Member of Clubs or Organizations: Not on file  . Attends Archivist Meetings: Not on file  .  Marital Status: Not on file  Intimate Partner Violence:   . Fear of Current or Ex-Partner: Not on file  . Emotionally Abused: Not on file  . Physically Abused: Not on file  . Sexually Abused: Not on file    Outpatient Medications Prior to Visit  Medication Sig Dispense Refill  . AMBULATORY NON FORMULARY MEDICATION oral appliance therapy - mouthpiece for cpap machine. 1 each 0  . amLODipine (NORVASC) 5 MG tablet Take 1 tablet (5 mg total) by mouth daily. TAKE 1 TABLET BY MOUTH EVERY DAY 90 tablet 0  . furosemide (LASIX) 20 MG tablet TAKE 1 TABLET (20 MG TOTAL) BY MOUTH DAILY AS NEEDED FOR EDEMA. NEEDS APPOINTMENT. 15 tablet 0  . hydrocortisone (ANUSOL-HC) 2.5 % rectal cream Place rectally 2 (two) times daily. (Patient taking differently: Place rectally 2 (two) times daily as needed. ) 60 g PRN  . hydrocortisone cream 1 % Apply 1 application topically 2 (two) times daily as needed for itching.    . Lidocaine 2 % GEL Apply 1 application topically 3 (three) times daily as needed. OK to sub OTC similar tx or patches 60 g 1  . lisinopril (ZESTRIL) 40 MG tablet TAKE 1 TABLET BY MOUTH EVERY DAY 90 tablet 0  . loratadine (CLARITIN) 10 MG tablet Take 10 mg by mouth daily.    . MULTIPLE MINERALS-VITAMINS PO Take 1 tablet by mouth 3 (three) times a week.     Marland Kitchen omeprazole (PRILOSEC) 40 MG capsule TAKE 1 CAPSULE BY MOUTH EVERY DAY 90 capsule 3  . rosuvastatin (CRESTOR) 20 MG tablet Take 1 tablet (20 mg total) by mouth daily. 90 tablet 3  . Tetrahydrozoline HCl (VISINE OP) Place 2 drops into both eyes daily as needed (for dry eyes).     No facility-administered medications prior to visit.    Allergies  Allergen Reactions  . Verapamil Other (See Comments)    Constipation.     Review of Systems     Objective:    Physical Exam Constitutional:      Appearance: She is well-developed.  HENT:     Head: Normocephalic and atraumatic.     Right Ear: Tympanic membrane, ear canal and external ear  normal.     Left Ear: Tympanic membrane, ear canal and external ear normal.     Nose: Nose normal.     Mouth/Throat:  Mouth: Mucous membranes are moist.  Eyes:     Conjunctiva/sclera: Conjunctivae normal.     Pupils: Pupils are equal, round, and reactive to light.  Neck:     Thyroid: No thyromegaly.  Cardiovascular:     Rate and Rhythm: Normal rate and regular rhythm.     Heart sounds: Normal heart sounds.  Pulmonary:     Effort: Pulmonary effort is normal.     Breath sounds: Normal breath sounds. No wheezing.  Musculoskeletal:     Cervical back: Neck supple.     Comments: Left leg with a few scattered varicose veins.  No significant tenderness over the shin or over the calf muscle itself.  Capillary refill of the great toe.  Dorsal pedal pulse 2+ and posterior tibial pulse 1+.  Lymphadenopathy:     Cervical: No cervical adenopathy.  Skin:    General: Skin is warm and dry.  Neurological:     Mental Status: She is alert and oriented to person, place, and time.     BP (!) 135/57   Pulse 86   Ht 5\' 8"  (1.727 m)   Wt (!) 322 lb (146.1 kg)   SpO2 96%   BMI 48.96 kg/m  Wt Readings from Last 3 Encounters:  03/09/20 (!) 322 lb (146.1 kg)  09/15/19 (!) 312 lb 6.4 oz (141.7 kg)  06/09/19 300 lb (136.1 kg)    There are no preventive care reminders to display for this patient.  There are no preventive care reminders to display for this patient.   Lab Results  Component Value Date   TSH 5.390 (H) 02/15/2020   Lab Results  Component Value Date   WBC 7.5 07/06/2018   HGB 14.1 07/06/2018   HCT 42.1 07/06/2018   MCV 88.4 07/06/2018   PLT 308 07/06/2018   Lab Results  Component Value Date   NA 140 02/15/2020   K 3.9 02/15/2020   CO2 23 02/15/2020   GLUCOSE 88 02/15/2020   BUN 16 02/15/2020   CREATININE 0.87 02/15/2020   BILITOT 0.5 02/15/2020   ALKPHOS 84 02/15/2020   AST 17 02/15/2020   ALT 16 02/15/2020   PROT 6.9 02/15/2020   ALBUMIN 4.4 02/15/2020    CALCIUM 8.8 02/15/2020   ANIONGAP 8 01/09/2018   Lab Results  Component Value Date   CHOL 135 02/15/2020   Lab Results  Component Value Date   HDL 54 02/15/2020   Lab Results  Component Value Date   LDLCALC 65 02/15/2020   Lab Results  Component Value Date   TRIG 81 02/15/2020   Lab Results  Component Value Date   CHOLHDL 2.5 02/15/2020   No results found for: HGBA1C     Assessment & Plan:   Problem List Items Addressed This Visit    None    Visit Diagnoses    Left leg pain    -  Primary   Relevant Orders   US Venous Img Lower Unilateral Left (Completed)   Sinus pain         Sinus pain-recommend nasal saline irrigations twice a day in addition to her Claritin.  If not improving after the weekend and please give [place] call back.  Would consider antibiotics at that point if she is not improving.  Could continue oral Claritin.  Avoid other drying agents.  Left leg pain-recommend Doppler to rule out DVT she has had again the discomfort on and off but it is never been as persistent as it has this week  so I do recommend that we rule that out.  Also consider some of this could be related to venous stasis and congestion in the leg.  Recommend a trial of compression stockings as well of think this could actually be really helpful and also help her intermittent lower extremity swelling that occurs as well.  She has a prescription for Lasix which she uses occasionally.  No orders of the defined types were placed in this encounter.    Beatrice Lecher, MD

## 2020-03-09 ENCOUNTER — Other Ambulatory Visit: Payer: Self-pay

## 2020-03-09 ENCOUNTER — Ambulatory Visit (INDEPENDENT_AMBULATORY_CARE_PROVIDER_SITE_OTHER): Payer: Federal, State, Local not specified - PPO | Admitting: Family Medicine

## 2020-03-09 ENCOUNTER — Ambulatory Visit (HOSPITAL_BASED_OUTPATIENT_CLINIC_OR_DEPARTMENT_OTHER)
Admission: RE | Admit: 2020-03-09 | Discharge: 2020-03-09 | Disposition: A | Discharge status: Home / Self Care | Payer: Federal, State, Local not specified - PPO | Source: Ambulatory Visit | Attending: Family Medicine | Admitting: Family Medicine

## 2020-03-09 ENCOUNTER — Ambulatory Visit: Payer: Federal, State, Local not specified - PPO

## 2020-03-09 ENCOUNTER — Encounter: Payer: Self-pay | Admitting: Family Medicine

## 2020-03-09 VITALS — BP 135/57 | HR 86 | Ht 68.0 in | Wt 322.0 lb

## 2020-03-09 DIAGNOSIS — M79605 Pain in left leg: Secondary | ICD-10-CM | POA: Diagnosis not present

## 2020-03-09 DIAGNOSIS — N200 Calculus of kidney: Secondary | ICD-10-CM | POA: Diagnosis not present

## 2020-03-09 DIAGNOSIS — N281 Cyst of kidney, acquired: Secondary | ICD-10-CM | POA: Diagnosis not present

## 2020-03-09 DIAGNOSIS — J3489 Other specified disorders of nose and nasal sinuses: Secondary | ICD-10-CM

## 2020-03-09 DIAGNOSIS — M79662 Pain in left lower leg: Secondary | ICD-10-CM | POA: Diagnosis not present

## 2020-03-09 DIAGNOSIS — R319 Hematuria, unspecified: Secondary | ICD-10-CM | POA: Diagnosis not present

## 2020-03-09 DIAGNOSIS — R1032 Left lower quadrant pain: Secondary | ICD-10-CM | POA: Diagnosis not present

## 2020-03-09 DIAGNOSIS — K449 Diaphragmatic hernia without obstruction or gangrene: Secondary | ICD-10-CM | POA: Diagnosis not present

## 2020-03-09 LAB — POCT URINALYSIS DIP (CLINITEK)
Bilirubin, UA: NEGATIVE
Glucose, UA: NEGATIVE mg/dL
Ketones, POC UA: NEGATIVE mg/dL
Leukocytes, UA: NEGATIVE
Nitrite, UA: NEGATIVE
POC PROTEIN,UA: NEGATIVE
Spec Grav, UA: 1.015 (ref 1.010–1.025)
Urobilinogen, UA: 0.2 E.U./dL
pH, UA: 5.5 (ref 5.0–8.0)

## 2020-03-09 MED ORDER — IOHEXOL 300 MG/ML  SOLN
100.0000 mL | Freq: Once | INTRAMUSCULAR | Status: AC | PRN
  Administered 2020-03-09: 100 mL via INTRAVENOUS

## 2020-03-09 NOTE — Addendum Note (Signed)
Addended by: Teddy Spike on: 03/09/2020 04:49 PM   Modules accepted: Orders

## 2020-03-09 NOTE — Addendum Note (Signed)
Addended by: Beatrice Lecher D on: 03/09/2020 04:44 PM   Modules accepted: Orders

## 2020-03-09 NOTE — Addendum Note (Signed)
Addended by: Beatrice Lecher D on: 03/09/2020 05:03 PM   Modules accepted: Level of Service

## 2020-03-09 NOTE — Addendum Note (Signed)
Addended by: Teddy Spike on: 03/09/2020 04:40 PM   Modules accepted: Orders

## 2020-03-09 NOTE — Progress Notes (Signed)
Rachael Hancock actually called back after her Doppler and said on the way home in the car she got sudden onset left lower quadrant pain.  She said it was severe enough that it actually brought tears to her eyes she went home and tried to put a heating pad on it and just could not quite get comfortable.  She says it started to ease off a little bit and felt a little better but then the pain started increasing again and then the pain started to improve again.  She denies any fevers chills or sweats she denies any problems with constipation or bowel she says she actually is been going pretty regularly.  She says in fact when it first hit she said it was a most like a sharp cramping pain and thought maybe she needed to go to the bathroom she said she did have a very small bowel movement.  But again no diarrhea.  No blood in the stool or urine she has had a prior history of kidney stones.   Had her come back into the office this afternoon for further exam.  Abdomen is tender in the left upper quadrant and left lower quadrant and suprapubic and epigastric area.  No guarding.  Urinalysis just showed some lysed blood but no sign of infection etc. she is not had any dysuria.  As we were sitting in the room after her urine sample she started to get some increase in pain and so recommended CT for further evaluation to look for diverticulitis versus kidney stone etc.  We were able to get her in this evening for further work-up.  Recent renal function was normal.   Spent total in encounter this morning and again this afternoon was 45 minutes.

## 2020-03-09 NOTE — Patient Instructions (Signed)
Recommend nasal saline irrigation twice a day.  Also getting in a hot shower and letting the steam can help open up your sinus cavities.  If not feeling better by Monday then please give me a call back and will recommend trial of antibiotics at that point in time.  If ultrasound is negative recommend getting some over-the-counter compression stockings with a pressure gradient between 20 and 30 mm of water.

## 2020-03-10 ENCOUNTER — Encounter: Payer: Self-pay | Admitting: Family Medicine

## 2020-03-10 DIAGNOSIS — I700 Atherosclerosis of aorta: Secondary | ICD-10-CM | POA: Insufficient documentation

## 2020-03-10 LAB — URINALYSIS, MICROSCOPIC ONLY
Bacteria, UA: NONE SEEN /HPF
Hyaline Cast: NONE SEEN /LPF

## 2020-03-16 ENCOUNTER — Other Ambulatory Visit: Payer: Self-pay | Admitting: Family Medicine

## 2020-03-16 DIAGNOSIS — I10 Essential (primary) hypertension: Secondary | ICD-10-CM

## 2020-04-07 NOTE — Progress Notes (Signed)
HPI: FU CP and palpitations. Previous monitors showed sinus with PACs and PVCs. Last echocardiogram April 2021 showed normal LV function, mild left ventricular enlargement, grade 1 diastolic dysfunction. Calcium score April 2021 12 which was 68 percentile.  Lower extremity venous Doppler September 2021 showed no DVT in the left lower extremity.  Seen previously at the dentist office and her heart rate was felt to be irregular.  We did order a monitor at last office visit but not performed.  Since last seen me, chest pain or syncope.  Occasional brief palpitations.  Current Outpatient Medications  Medication Sig Dispense Refill   AMBULATORY NON FORMULARY MEDICATION oral appliance therapy - mouthpiece for cpap machine. 1 each 0   amLODipine (NORVASC) 5 MG tablet Take 1 tablet (5 mg total) by mouth daily. TAKE 1 TABLET BY MOUTH EVERY DAY 90 tablet 0   furosemide (LASIX) 20 MG tablet TAKE 1 TABLET (20 MG TOTAL) BY MOUTH DAILY AS NEEDED FOR EDEMA. NEEDS APPOINTMENT. (Patient taking differently: Take 20 mg by mouth as needed for edema. Needs appointment.) 15 tablet 0   hydrocortisone (ANUSOL-HC) 2.5 % rectal cream Place rectally 2 (two) times daily. (Patient taking differently: Place rectally 2 (two) times daily as needed. ) 60 g PRN   hydrocortisone cream 1 % Apply 1 application topically 2 (two) times daily as needed for itching.     Lidocaine 2 % GEL Apply 1 application topically 3 (three) times daily as needed. OK to sub OTC similar tx or patches 60 g 1   lisinopril (ZESTRIL) 40 MG tablet TAKE 1 TABLET BY MOUTH EVERY DAY 90 tablet 0   loratadine (CLARITIN) 10 MG tablet Take 10 mg by mouth daily.     MULTIPLE MINERALS-VITAMINS PO Take 1 tablet by mouth 3 (three) times a week.      omeprazole (PRILOSEC) 40 MG capsule TAKE 1 CAPSULE BY MOUTH EVERY DAY 90 capsule 3   rosuvastatin (CRESTOR) 20 MG tablet Take 1 tablet (20 mg total) by mouth daily. 90 tablet 3   Tetrahydrozoline HCl  (VISINE OP) Place 2 drops into both eyes daily as needed (for dry eyes).     No current facility-administered medications for this visit.     Past Medical History:  Diagnosis Date   Anxiety    Arthritis    Complication of anesthesia    HA  WITH SPINAL, WISDOM TO AWHILE TO WAKE UP WHEN YOUNGER-    Depression    GERD (gastroesophageal reflux disease)    Headache    MIGRAINE   Hypertension    Morbid obesity ([place])    Palpitations    Reflux    Sleep apnea    BORDERLINE -  UNABLE TO USE CPAP 4-5 YRS AGO    Past Surgical History:  Procedure Laterality Date   ANKLE FRACTURE SURGERY     cyst removed     KIDNEY STONE SURGERY     NOVASURE ABLATION     TOTAL SHOULDER ARTHROPLASTY Right 01/08/2018   Procedure: RIGHT TOTAL SHOULDER ARTHROPLASTY;  Surgeon: Tania Ade, MD;  Location: [facility];  Service: Orthopedics;  Laterality: Right;   WISDOM TOOTH EXTRACTION      Social History   Socioeconomic History   Marital status: Married    Spouse name: Tim   Number of children: 2   Years of education: 3 YRS COLL   Highest education level: Not on file  Occupational History   Occupation: Barista SERVICE REP  Employer: SELF    Comment: [employer]  Tobacco Use   Smoking status: Never Smoker   Smokeless tobacco: Never Used  Vaping Use   Vaping Use: Never used  Substance and Sexual Activity   Alcohol use: Yes    Alcohol/week: 0.0 standard drinks    Comment: Rare   Drug use: No   Sexual activity: Not on file  Other Topics Concern   Not on file  Social History Narrative   CUSTOMER SERVICE REP [place]   3 [place]   1 DAUGHTER   Social Determinants of Health   Financial Resource Strain:    Difficulty of Paying Living Expenses: Not on file  Food Insecurity:    Worried About Charity fundraiser in the Last Year: Not on file   [employer] of Food in the Last Year: Not on file  Transportation Needs:     Lack of Transportation (Medical): Not on file   Lack of Transportation (Non-Medical): Not on file  Physical Activity:    Days of Exercise per Week: Not on file   Minutes of Exercise per Session: Not on file  Stress:    Feeling of Stress : Not on file  Social Connections:    Frequency of Communication with Friends and Family: Not on file   Frequency of Social Gatherings with Friends and Family: Not on file   Attends Religious Services: Not on file   Active Member of Clubs or Organizations: Not on file   Attends Archivist Meetings: Not on file   Marital Status: Not on file  Intimate Partner Violence:    Fear of Current or Ex-Partner: Not on file   Emotionally Abused: Not on file   Physically Abused: Not on file   Sexually Abused: Not on file    Family History  Problem Relation Age of Onset   Heart attack Father        at age 86   Cancer Paternal Grandmother    Coronary artery disease Other    Arrhythmia Mother        pacemaker insertion   Diabetes Maternal Aunt     ROS: no fevers or chills, productive cough, hemoptysis, dysphasia, odynophagia, melena, hematochezia, dysuria, hematuria, rash, seizure activity, orthopnea, PND, pedal edema, claudication. Remaining systems are negative.  Physical Exam: Well-developed obese in no acute distress.  Skin is warm and dry.  HEENT is normal.  Neck is supple.  Chest is clear to auscultation with normal expansion.  Cardiovascular exam is regular rate and rhythm.  Abdominal exam nontender or distended. No masses palpated. Extremities show no edema. neuro grossly intact  ECG-sinus rhythm at a rate of 84, no ST changes.  Personally reviewed  A/P  1 mildly elevated calcium score-continue statin.  She is not having chest pain.  2 hypertension-blood pressure controlled.  Continue present medical regimen.  3 history of palpitations-LV function is normal.  We can consider addition of beta-blockade in  the future if symptoms worsen.  Kirk Ruths, MD

## 2020-04-12 ENCOUNTER — Other Ambulatory Visit: Payer: Self-pay

## 2020-04-12 ENCOUNTER — Encounter: Payer: Self-pay | Admitting: Cardiology

## 2020-04-12 ENCOUNTER — Ambulatory Visit: Payer: Federal, State, Local not specified - PPO | Admitting: Cardiology

## 2020-04-12 VITALS — BP 127/77 | HR 84 | Ht 67.0 in | Wt 307.0 lb

## 2020-04-12 DIAGNOSIS — R002 Palpitations: Secondary | ICD-10-CM

## 2020-04-12 DIAGNOSIS — I10 Essential (primary) hypertension: Secondary | ICD-10-CM

## 2020-04-12 DIAGNOSIS — R931 Abnormal findings on diagnostic imaging of heart and coronary circulation: Secondary | ICD-10-CM | POA: Diagnosis not present

## 2020-04-12 NOTE — Patient Instructions (Signed)

## 2020-05-05 ENCOUNTER — Other Ambulatory Visit: Payer: Self-pay

## 2020-05-05 ENCOUNTER — Other Ambulatory Visit: Payer: Self-pay | Admitting: Family Medicine

## 2020-05-05 ENCOUNTER — Encounter: Payer: Self-pay | Admitting: Family Medicine

## 2020-05-05 ENCOUNTER — Emergency Department (INDEPENDENT_AMBULATORY_CARE_PROVIDER_SITE_OTHER)
Admission: RE | Admit: 2020-05-05 | Discharge: 2020-05-05 | Disposition: A | Discharge status: Home / Self Care | Payer: Federal, State, Local not specified - PPO | Source: Ambulatory Visit | Attending: Family Medicine | Admitting: Family Medicine

## 2020-05-05 VITALS — BP 133/75 | HR 83 | Temp 98.8°F | Resp 16 | Ht 68.0 in | Wt 322.0 lb

## 2020-05-05 DIAGNOSIS — R300 Dysuria: Secondary | ICD-10-CM | POA: Diagnosis not present

## 2020-05-05 DIAGNOSIS — I10 Essential (primary) hypertension: Secondary | ICD-10-CM

## 2020-05-05 LAB — POCT URINALYSIS DIP (MANUAL ENTRY)
Bilirubin, UA: NEGATIVE
Blood, UA: NEGATIVE
Glucose, UA: NEGATIVE mg/dL
Ketones, POC UA: NEGATIVE mg/dL
Leukocytes, UA: NEGATIVE
Nitrite, UA: NEGATIVE
Protein Ur, POC: NEGATIVE mg/dL
Spec Grav, UA: >=1.03 — AB (ref 1.010–1.025)
Urobilinogen, UA: 0.2 E.U./dL
pH, UA: 5.5 (ref 5.0–8.0)

## 2020-05-05 MED ORDER — CEPHALEXIN 500 MG PO CAPS: 500.0000 mg | ORAL_CAPSULE | Freq: Two times a day (BID) | ORAL | 0 refills | Status: AC

## 2020-05-05 NOTE — ED Triage Notes (Signed)
Urgency & polyuria - started at [facility]  Pfizer Vaccine 04/12/20

## 2020-05-05 NOTE — Discharge Instructions (Addendum)
Increase fluid intake. °If symptoms become significantly worse during the night or over the weekend, proceed to the local emergency room.  °

## 2020-05-05 NOTE — ED Provider Notes (Signed)
[facility]    CSN: [id number] Arrival date & time: 05/05/20  1723      History   Chief Complaint Chief Complaint  Patient presents with  . Appointment  . Urinary Urgency    HPI Rachael Hancock is a 64 y.o. female.   Patient complains of onset of urinary urgency and frequency at 0300 this morning.  She denies dysuria, abdominal pain, fever, and nausea/vomiting.  The history is provided by the patient.  Urinary Frequency This is a new problem. The current episode started 12 to 24 hours ago. The problem occurs constantly. The problem has not changed since onset.Pertinent negatives include no abdominal pain. Nothing aggravates the symptoms. Nothing relieves the symptoms. She has tried nothing for the symptoms.    Past Medical History:  Diagnosis Date  . Anxiety   . Arthritis   . Complication of anesthesia    HA  WITH SPINAL, WISDOM TO AWHILE TO WAKE UP WHEN YOUNGER-   . Depression   . GERD (gastroesophageal reflux disease)   . Headache    MIGRAINE  . Hypertension   . Morbid obesity ([place])   . Palpitations   . Reflux   . Sleep apnea    BORDERLINE -  UNABLE TO USE CPAP 4-5 YRS AGO    Patient Active Problem List   Diagnosis Date Noted  . Aortic atherosclerosis ([place]) 03/10/2020  . Subclinical hyperthyroidism 06/09/2019  . Left shoulder pain 06/09/2019  . Mid back pain on left side 01/08/2019  . Status post total shoulder arthroplasty, 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  . Left groin pain 11/09/2017  . 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 Kidney stone on left side 04/08/2016  . Left ureteral stone 03/26/2016  . Bilateral lower extremity edema 03/21/2015  . Adenomatous colon polyp 08/13/2013  . HTN (hypertension) 08/13/2013  . OSA (obstructive sleep apnea) 12/23/2011  . MORBID OBESITY 11/07/2009  . ANXIETY STATE, UNSPECIFIED 11/07/2009  . DEPRESSION, MILD 11/07/2009  . PALPITATIONS 08/01/2009  . VITAMIN D DEFICIENCY  07/14/2009  . LEG CRAMPS 03/15/2009  . ALLERGIC RHINITIS 01/13/2009  . GERD 01/13/2009  . ESSENTIAL HYPERTENSION, BENIGN 08/26/2008  . SHOULDER PAIN 08/26/2008    Past Surgical History:  Procedure Laterality Date  . ANKLE FRACTURE SURGERY    . cyst removed    . KIDNEY STONE SURGERY    . NOVASURE ABLATION    . TOTAL SHOULDER ARTHROPLASTY Right 01/08/2018   Procedure: RIGHT TOTAL SHOULDER ARTHROPLASTY;  Surgeon: Tania Ade, MD;  Location: [facility];  Service: Orthopedics;  Laterality: Right;  . WISDOM TOOTH EXTRACTION      OB History   No obstetric history on file.      Home Medications    Prior to Admission medications   Medication Sig Start Date End Date Taking? Authorizing Provider  amLODipine (NORVASC) 5 MG tablet Take 1 tablet (5 mg total) by mouth daily. TAKE 1 TABLET BY MOUTH EVERY DAY 02/11/20  Yes Hali Marry, MD  furosemide (LASIX) 20 MG tablet TAKE 1 TABLET (20 MG TOTAL) BY MOUTH DAILY AS NEEDED FOR EDEMA. NEEDS APPOINTMENT. Patient taking differently: Take 20 mg by mouth as needed for edema. Needs appointment. 02/17/20  Yes Hali Marry, MD  hydrocortisone cream 1 % Apply 1 application topically 2 (two) times daily as needed for itching.   Yes [provider]  lisinopril (ZESTRIL) 40 MG tablet TAKE 1 TABLET BY MOUTH EVERY DAY 05/05/20  Yes Hali Marry, MD  loratadine (CLARITIN) 10 MG tablet Take 10 mg by mouth daily.   Yes [provider]  MULTIPLE MINERALS-VITAMINS PO Take 1 tablet by mouth 3 (three) times a week.    Yes [provider]  omeprazole (PRILOSEC) 40 MG capsule TAKE 1 CAPSULE BY MOUTH EVERY DAY 08/16/19  Yes Hali Marry, MD  rosuvastatin (CRESTOR) 20 MG tablet Take 1 tablet (20 mg total) by mouth daily. 09/24/19 03/09/21 Yes Crenshaw, Denice Bors, MD  Tetrahydrozoline HCl (VISINE OP) Place 2 drops into both eyes daily as needed (for dry eyes).   Yes [provider]  AMBULATORY NON FORMULARY  MEDICATION oral appliance therapy - mouthpiece for cpap machine. 02/25/19   Hali Marry, MD  cephALEXin (KEFLEX) 500 MG capsule Take 1 capsule (500 mg total) by mouth 2 (two) times daily for 7 days. 05/05/20 05/12/20  Kandra Nicolas, MD  hydrocortisone (ANUSOL-HC) 2.5 % rectal cream Place rectally 2 (two) times daily. Patient taking differently: Place rectally 2 (two) times daily as needed.  07/07/18   Hali Marry, MD  Lidocaine 2 % GEL Apply 1 application topically 3 (three) times daily as needed. OK to sub OTC similar tx or patches Patient not taking: Reported on 05/05/2020 06/09/19   Hali Marry, MD    Family History Family History  Problem Relation Age of Onset  . Heart attack Father        at age 37  . Cancer Paternal Grandmother   . Coronary artery disease Other   . Arrhythmia Mother        pacemaker insertion  . Diabetes Maternal Aunt     Social History Social History   Tobacco Use  . Smoking status: Never Smoker  . Smokeless tobacco: Never Used  Vaping Use  . Vaping Use: Never used  Substance Use Topics  . Alcohol use: Yes    Alcohol/week: 0.0 standard drinks    Comment: Rare  . Drug use: No     Allergies   Verapamil   Review of Systems Review of Systems  Constitutional: Negative for activity change, appetite change, chills, diaphoresis, fatigue and fever.  Gastrointestinal: Negative for abdominal pain and nausea.  Genitourinary: Positive for frequency. Negative for dysuria, flank pain, hematuria, pelvic pain, urgency and vaginal bleeding.  All other systems reviewed and are negative.    Physical Exam Triage Vital Signs ED Triage Vitals  Enc Vitals Group     BP 05/05/20 1736 133/75     Pulse Rate 05/05/20 1736 83     Resp 05/05/20 1736 16     Temp 05/05/20 1736 98.8 F (37.1 C)     Temp Source 05/05/20 1736 Oral     SpO2 05/05/20 1736 98 %     Weight 05/05/20 1735 (!) 322 lb (146.1 kg)     Height 05/05/20 1735 5\' 8"   (1.727 m)     Head Circumference --      Peak Flow --      Pain Score 05/05/20 1737 0     Pain Loc --      Pain Edu? --      Excl. in [place]? --    No data found.  Updated Vital Signs BP 133/75 (BP Location: Right Arm)   Pulse 83   Temp 98.8 F (37.1 C) (Oral)   Resp 16   Ht 5\' 8"  (1.727 m)   Wt (!) 146.1 kg   SpO2 98%   BMI 48.96 kg/m   Visual Acuity  Right Eye Distance:   Left Eye Distance:   Bilateral Distance:    Right Eye Near:   Left Eye Near:    Bilateral Near:     Physical Exam Nursing notes and Vital Signs reviewed. Appearance:  Patient appears stated age, and in no acute distress.    Eyes:  Pupils are equal, round, and reactive to light and accomodation.  Extraocular movement is intact.  Conjunctivae are not inflamed   Pharynx:  Normal; moist mucous membranes  Neck:  Supple.  No adenopathy Lungs:  Clear to auscultation.  Breath sounds are equal.  Moving air well. Heart:  Regular rate and rhythm without murmurs, rubs, or gallops.  Abdomen:  Nontender without masses or hepatosplenomegaly.  Bowel sounds are present.  No CVA or flank tenderness.  Extremities:  No edema.  Skin:  No rash present.     UC Treatments / Results  Labs (all labs ordered are listed, but only abnormal results are displayed) Labs Reviewed  POCT URINALYSIS DIP (MANUAL ENTRY) - Abnormal; Notable for the following components:      Result Value   Clarity, UA cloudy (*)    Spec Grav, UA >=1.030 (*)    All other components within normal limits  URINE CULTURE    EKG   Radiology No results found.  Procedures Procedures (including critical care time)  Medications Ordered in UC Medications - No data to display  Initial Impression / Assessment and Plan / UC Course  I have reviewed the triage vital signs and the nursing notes.  Pertinent labs & imaging results that were available during my care of the patient were reviewed by me and considered in my medical decision making (see chart  for details).    Urine culture pending.  Begin Keflex. Followup with Family Doctor if not improved in one week.    Final Clinical Impressions(s) / UC Diagnoses   Final diagnoses:  Dysuria     Discharge Instructions     Increase fluid intake.  If symptoms become significantly worse during the night or over the weekend, proceed to the local emergency room.     ED Prescriptions    Medication Sig Dispense Auth. Provider   cephALEXin (KEFLEX) 500 MG capsule Take 1 capsule (500 mg total) by mouth 2 (two) times daily for 7 days. 14 capsule Kandra Nicolas, MD        Kandra Nicolas, MD 05/07/20 2053

## 2020-05-07 LAB — URINE CULTURE
MICRO NUMBER:: 11198825
SPECIMEN QUALITY:: ADEQUATE

## 2020-05-09 ENCOUNTER — Encounter: Payer: Self-pay | Admitting: Family Medicine

## 2020-05-09 DIAGNOSIS — K5900 Constipation, unspecified: Secondary | ICD-10-CM

## 2020-05-10 ENCOUNTER — Other Ambulatory Visit: Payer: Self-pay | Admitting: Family Medicine

## 2020-05-10 DIAGNOSIS — I10 Essential (primary) hypertension: Secondary | ICD-10-CM

## 2020-05-26 MED ORDER — LUBIPROSTONE 24 MCG PO CAPS: 24.0000 ug | ORAL_CAPSULE | Freq: Two times a day (BID) | ORAL | 2 refills | Status: DC

## 2020-05-30 NOTE — Telephone Encounter (Signed)
OK  to change to in person visit?

## 2020-06-01 ENCOUNTER — Encounter: Payer: Self-pay | Admitting: Family Medicine

## 2020-06-01 ENCOUNTER — Ambulatory Visit: Payer: Federal, State, Local not specified - PPO | Admitting: Family Medicine

## 2020-06-01 ENCOUNTER — Other Ambulatory Visit: Payer: Self-pay

## 2020-06-01 VITALS — BP 136/49 | HR 81 | Ht 67.0 in | Wt 322.0 lb

## 2020-06-01 DIAGNOSIS — K5900 Constipation, unspecified: Secondary | ICD-10-CM

## 2020-06-01 DIAGNOSIS — J019 Acute sinusitis, unspecified: Secondary | ICD-10-CM | POA: Diagnosis not present

## 2020-06-01 MED ORDER — LINACLOTIDE 145 MCG PO CAPS: 145.0000 ug | ORAL_CAPSULE | Freq: Every day | ORAL | 1 refills | Status: DC

## 2020-06-01 MED ORDER — AMOXICILLIN-POT CLAVULANATE 875-125 MG PO TABS: 1.0000 | ORAL_TABLET | Freq: Two times a day (BID) | ORAL | 0 refills | Status: DC

## 2020-06-01 NOTE — Progress Notes (Signed)
She reports sxs x 5 days. Facial pain/pressure, pain in teeth, dull headache on/off. She has been taking IBU and Claritin.   She also stated that since her last OV  That she continues to experience abdominal pain and that the Amitiza was not covered by her insurance.

## 2020-06-01 NOTE — Progress Notes (Signed)
Acute Office Visit  Subjective:    Patient ID: Rachael Hancock, female    DOB: 1955/12/07, 64 y.o.   MRN: [id number]  Chief Complaint  Patient presents with  . Sinusitis    HPI Patient is in today for She reports sxs x 5 days. Facial pain/pressure, pain in teeth, dull headache on/off. She has been taking IBU and Claritin.   She also stated that since her last OV  That she continues to experience abdominal pain and that the Amitiza was not covered by her insurance.   Past Medical History:  Diagnosis Date  . Anxiety   . Arthritis   . Complication of anesthesia    HA  WITH SPINAL, WISDOM TO AWHILE TO WAKE UP WHEN YOUNGER-   . Depression   . GERD (gastroesophageal reflux disease)   . Headache    MIGRAINE  . Hypertension   . Morbid obesity ([place])   . Palpitations   . Reflux   . Sleep apnea    BORDERLINE -  UNABLE TO USE CPAP 4-5 YRS AGO    Past Surgical History:  Procedure Laterality Date  . ANKLE FRACTURE SURGERY    . cyst removed    . KIDNEY STONE SURGERY    . NOVASURE ABLATION    . TOTAL SHOULDER ARTHROPLASTY Right 01/08/2018   Procedure: RIGHT TOTAL SHOULDER ARTHROPLASTY;  Surgeon: Tania Ade, MD;  Location: [facility];  Service: Orthopedics;  Laterality: Right;  . WISDOM TOOTH EXTRACTION      Family History  Problem Relation Age of Onset  . Heart attack Father        at age 16  . Cancer Paternal Grandmother   . Coronary artery disease Other   . Arrhythmia Mother        pacemaker insertion  . Diabetes Maternal Aunt     Social History   Socioeconomic History  . Marital status: Married    Spouse name: Octavia Bruckner  . Number of children: 2  . Years of education: 3 YRS COLL  . Highest education level: Not on file  Occupational History  . Occupation: Barista SERVICE REP    Employer: SELF    Comment: [employer]  Tobacco Use  . Smoking status: Never Smoker  . Smokeless tobacco: Never Used  Vaping Use  . Vaping Use: Never used  Substance and  Sexual Activity  . Alcohol use: Yes    Alcohol/week: 0.0 standard drinks    Comment: Rare  . Drug use: No  . Sexual activity: Not on file  Other Topics Concern  . 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 AMBULATORY NON FORMULARY MEDICATION oral appliance therapy - mouthpiece for cpap machine. 1 each 0  . amLODipine (NORVASC) 5 MG tablet TAKE 1 TABLET (5 MG TOTAL) BY MOUTH DAILY. TAKE 1 TABLET BY MOUTH EVERY DAY 90 tablet 0  . hydrocortisone cream 1 % Apply 1 application topically 2 (two) times daily as needed for itching.    Marland Kitchen lisinopril (  ZESTRIL) 40 MG tablet TAKE 1 TABLET BY MOUTH EVERY DAY 90 tablet 1  . loratadine (CLARITIN) 10 MG tablet Take 10 mg by mouth daily.    . MULTIPLE MINERALS-VITAMINS PO Take 1 tablet by mouth 3 (three) times a week.     Marland Kitchen omeprazole (PRILOSEC) 40 MG capsule TAKE 1 CAPSULE BY MOUTH EVERY DAY 90 capsule 3  . rosuvastatin (CRESTOR) 20 MG tablet Take 1 tablet (20 mg total) by mouth daily. 90 tablet 3  . furosemide (LASIX) 20 MG tablet TAKE 1 TABLET (20 MG TOTAL) BY MOUTH DAILY AS NEEDED FOR EDEMA. NEEDS APPOINTMENT. (Patient taking differently: Take 20 mg by mouth as needed for edema. Needs appointment.) 15 tablet 0  . hydrocortisone (ANUSOL-HC) 2.5 % rectal cream Place rectally 2 (two) times daily. (Patient taking differently: Place rectally 2 (two) times daily as needed. ) 60 g PRN  . lubiprostone (AMITIZA) 24 MCG capsule Take 1 capsule (24 mcg total) by mouth 2 (two) times daily with a meal. 60 capsule 2  . Tetrahydrozoline  HCl (VISINE OP) Place 2 drops into both eyes daily as needed (for dry eyes).     No facility-administered medications prior to visit.    Allergies  Allergen Reactions  . Verapamil Other (See Comments)    Constipation.     Review of Systems     Objective:    Physical Exam Constitutional:      Appearance: She is well-developed and well-nourished.  HENT:     Head: Normocephalic and atraumatic.     Right Ear: External ear normal.     Left Ear: External ear normal.     Nose: Nose normal.     Mouth/Throat:     Mouth: Oropharynx is clear and moist.      Comments: TMs and canals are clear. Eyes:     Extraocular Movements: EOM normal.     Conjunctiva/sclera: Conjunctivae normal.     Pupils: Pupils are equal, round, and reactive to light.  Neck:     Thyroid: No thyromegaly.  Cardiovascular:     Rate and Rhythm: Normal rate and regular rhythm.     Heart sounds: Normal heart sounds.     Comments: No carotid bruits. Pulmonary:     Effort: Pulmonary effort is normal.     Breath sounds: Normal breath sounds. No wheezing.  Musculoskeletal:     Cervical back: Neck supple.  Lymphadenopathy:     Cervical: No cervical adenopathy.  Skin:    General: Skin is warm and dry.  Neurological:     Mental Status: She is alert and oriented to person, place, and time.  Psychiatric:        Mood and Affect: Mood and affect normal.     BP (!) 136/49   Pulse 81   Ht 5\' 7"  (1.702 m)   Wt (!) 322 lb (146.1 kg)   SpO2 100%   BMI 50.43 kg/m  Wt Readings from Last 3 Encounters:  06/01/20 (!) 322 lb (146.1 kg)  05/05/20 (!) 322 lb (146.1 kg)  04/12/20 (!) 307 lb (139.3 kg)    There are no preventive care reminders to display for this patient.  There are no preventive care reminders to display for this patient.   Lab Results  Component Value Date   TSH 5.390 (H) 02/15/2020   Lab Results  Component Value Date   WBC 7.5 07/06/2018   HGB 14.1 07/06/2018   HCT 42.1 07/06/2018   MCV  88.4 07/06/2018  PLT 308 07/06/2018   Lab Results  Component Value Date   NA 140 02/15/2020   K 3.9 02/15/2020   CO2 23 02/15/2020   GLUCOSE 88 02/15/2020   BUN 16 02/15/2020   CREATININE 0.87 02/15/2020   BILITOT 0.5 02/15/2020   ALKPHOS 84 02/15/2020   AST 17 02/15/2020   ALT 16 02/15/2020   PROT 6.9 02/15/2020   ALBUMIN 4.4 02/15/2020   CALCIUM 8.8 02/15/2020   ANIONGAP 8 01/09/2018   Lab Results  Component Value Date   CHOL 135 02/15/2020   Lab Results  Component Value Date   HDL 54 02/15/2020   Lab Results  Component Value Date   LDLCALC 65 02/15/2020   Lab Results  Component Value Date   TRIG 81 02/15/2020   Lab Results  Component Value Date   CHOLHDL 2.5 02/15/2020   No results found for: HGBA1C     Assessment & Plan:   Problem List Items Addressed This Visit   None   Visit Diagnoses    Acute non-recurrent sinusitis, unspecified location    -  Primary   Relevant Medications   amoxicillin-clavulanate (AUGMENTIN) 875-125 MG tablet   Constipation, unspecified constipation type       Relevant Medications   linaclotide (LINZESS) 145 MCG CAPS capsule     sinuitis, acute- will tx with augmentin.  Call if not improving in 1 week.  Okay to continue symptomatic care.  Constipation-the Amitiza is not covered so we will try to see if they will cover Linzess.  New prescription sent to pharmacy.  Meds ordered this encounter  Medications  . amoxicillin-clavulanate (AUGMENTIN) 875-125 MG tablet    Sig: Take 1 tablet by mouth 2 (two) times daily.    Dispense:  14 tablet    Refill:  0  . linaclotide (LINZESS) 145 MCG CAPS capsule    Sig: Take 1 capsule (145 mcg total) by mouth daily before breakfast.    Dispense:  30 capsule    Refill:  1     Beatrice Lecher, MD

## 2020-07-10 ENCOUNTER — Telehealth: Payer: Self-pay

## 2020-07-10 DIAGNOSIS — K5900 Constipation, unspecified: Secondary | ICD-10-CM

## 2020-07-10 MED ORDER — LACTULOSE 20 GM/30ML PO SOLN: 15.0000 mL | Freq: Two times a day (BID) | ORAL | 1 refills | Status: DC

## 2020-07-10 NOTE — Telephone Encounter (Signed)
The insurance has rejected the claim for Linzess 145 mg/ml. Covered formulary that does not require prior authorization is:   Lactulose  KEY: [id number]

## 2020-07-10 NOTE — Telephone Encounter (Signed)
Call pt: Insurance requires that she have tried lactulose first before the [place]. I will send over new rx.

## 2020-07-11 NOTE — Telephone Encounter (Signed)
Pt has been updated and is aware that lactulose rx sent to the pharmacy. No other inquiries during the call.

## 2020-07-15 DIAGNOSIS — Z20822 Contact with and (suspected) exposure to covid-19: Secondary | ICD-10-CM | POA: Diagnosis not present

## 2020-07-15 DIAGNOSIS — J069 Acute upper respiratory infection, unspecified: Secondary | ICD-10-CM | POA: Diagnosis not present

## 2020-07-19 ENCOUNTER — Other Ambulatory Visit: Payer: Self-pay

## 2020-07-19 ENCOUNTER — Other Ambulatory Visit: Payer: Self-pay | Admitting: Physician Assistant

## 2020-07-19 ENCOUNTER — Ambulatory Visit (INDEPENDENT_AMBULATORY_CARE_PROVIDER_SITE_OTHER): Payer: Federal, State, Local not specified - PPO

## 2020-07-19 ENCOUNTER — Ambulatory Visit: Payer: Federal, State, Local not specified - PPO | Admitting: Physician Assistant

## 2020-07-19 VITALS — BP 143/65 | HR 90 | Temp 98.0°F | Ht 67.0 in | Wt 330.0 lb

## 2020-07-19 DIAGNOSIS — R0781 Pleurodynia: Secondary | ICD-10-CM | POA: Diagnosis not present

## 2020-07-19 DIAGNOSIS — R10812 Left upper quadrant abdominal tenderness: Secondary | ICD-10-CM

## 2020-07-19 DIAGNOSIS — R10816 Epigastric abdominal tenderness: Secondary | ICD-10-CM

## 2020-07-19 DIAGNOSIS — R319 Hematuria, unspecified: Secondary | ICD-10-CM

## 2020-07-19 DIAGNOSIS — R0789 Other chest pain: Secondary | ICD-10-CM | POA: Diagnosis not present

## 2020-07-19 LAB — POCT URINALYSIS DIP (CLINITEK)
Bilirubin, UA: NEGATIVE
Glucose, UA: NEGATIVE mg/dL
Ketones, POC UA: NEGATIVE mg/dL
Leukocytes, UA: NEGATIVE
Nitrite, UA: NEGATIVE
POC PROTEIN,UA: NEGATIVE
Spec Grav, UA: 1.025 (ref 1.010–1.025)
Urobilinogen, UA: 0.2 E.U./dL
pH, UA: 5.5 (ref 5.0–8.0)

## 2020-07-19 MED ORDER — ORPHENADRINE CITRATE ER 100 MG PO TB12: 100.0000 mg | ORAL_TABLET | Freq: Two times a day (BID) | ORAL | 0 refills | Status: DC | PRN

## 2020-07-19 MED ORDER — DICLOFENAC SODIUM 1 % EX GEL: 4.0000 g | Freq: Four times a day (QID) | CUTANEOUS | 1 refills | Status: DC

## 2020-07-19 NOTE — Progress Notes (Signed)
Subjective:    Patient ID: Rachael Hancock, female    DOB: 08/09/1955, 65 y.o.   MRN: [id number]  HPI  Patient is a 65 year old morbidly obese female who presents to the clinic with intermittent left lower rib pain that at times radiates to the back for over a year.  It is almost like a cramp and it seems to happen more when she is sitting at her desk for an extended amount of time.  She does have a history of kidney stones.  Does not seem to radiate down into her flank or lower abdomen.  She denies any urinary symptoms such as dysuria or increase in frequency or urgency.  She denies any fever, chills, nausea or vomiting.  She does have ongoing reflux that is controlled with Protonix.  She denies any melena or hematochezia.  She does have ongoing constipation.  Insurance did not cover her Linzess.  She is having to use over-the-counter options to control her constipation.  She did have a recent normal mammogram.  She denies any known injury.  She denies any abdominal pain.    .. Active Ambulatory Problems    Diagnosis Date Noted  . VITAMIN D DEFICIENCY 07/14/2009  . MORBID OBESITY 11/07/2009  . ANXIETY STATE, UNSPECIFIED 11/07/2009  . DEPRESSION, MILD 11/07/2009  . ESSENTIAL HYPERTENSION, BENIGN 08/26/2008  . ALLERGIC RHINITIS 01/13/2009  . GERD 01/13/2009  . SHOULDER PAIN 08/26/2008  . LEG CRAMPS 03/15/2009  . PALPITATIONS 08/01/2009  . OSA (obstructive sleep apnea) 12/23/2011  . Bilateral lower extremity edema 03/21/2015  . 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 Left groin pain 11/09/2017  . Status post total shoulder arthroplasty, 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  . Mid back pain on left side 01/08/2019  . Subclinical hyperthyroidism 06/09/2019  . Left shoulder pain 06/09/2019  . Adenomatous colon polyp 08/13/2013  . HTN (hypertension) 08/13/2013  . Kidney stone on left side 04/08/2016  . Left ureteral stone 03/26/2016  . Aortic atherosclerosis  ([place]) 03/10/2020  . Epigastric abdominal tenderness without rebound tenderness 07/24/2020  . Left upper quadrant abdominal tenderness without rebound tenderness 07/24/2020  . Rib pain on left side 07/24/2020  . Hematuria 07/24/2020   Resolved Ambulatory Problems    Diagnosis Date Noted  . LOM 07/14/2009  . LEG PAIN 05/23/2009   Past Medical History:  Diagnosis Date  . Anxiety   . Arthritis   . Complication of anesthesia   . Depression   . GERD (gastroesophageal reflux disease)   . Headache   . Hypertension   . Reflux   . Sleep apnea       Review of Systems See HPI.     Objective:   Physical Exam Vitals reviewed.  Constitutional:      Appearance: She is well-developed. She is obese.  HENT:     Head: Normocephalic.  Cardiovascular:     Rate and Rhythm: Normal rate and regular rhythm.     Pulses: Normal pulses.  Pulmonary:     Effort: Pulmonary effort is normal.     Breath sounds: Normal breath sounds.     Comments: Lower left rib discomfort to palpation.  Abdominal:     General: Bowel sounds are normal. There are no signs of injury.     Palpations: Abdomen is soft.     Tenderness: There is abdominal tenderness in the epigastric area and left upper quadrant. There is no right CVA tenderness, left CVA tenderness, guarding or rebound.  Neurological:  General: No focal deficit present.     Mental Status: She is alert.  Psychiatric:        Mood and Affect: Mood normal.       .. Results for orders placed or performed in visit on 07/19/20  Urine Culture   Specimen: Urine  Result Value Ref Range   MICRO NUMBER: [id number]    SPECIMEN QUALITY: Adequate    Sample Source URINE    STATUS: FINAL    Result:      Mixed genital flora isolated. These superficial bacteria are not indicative of a urinary tract infection. No further organism identification is warranted on this specimen. If clinically indicated, recollect clean-catch, mid-stream urine and transfer   immediately to Urine Culture Transport Tube.   POCT URINALYSIS DIP (CLINITEK)  Result Value Ref Range   Color, UA yellow yellow   Clarity, UA clear clear   Glucose, UA negative negative mg/dL   Bilirubin, UA negative negative   Ketones, POC UA negative negative mg/dL   Spec Grav, UA 1.025 1.010 - 1.025   Blood, UA trace-lysed (A) negative   pH, UA 5.5 5.0 - 8.0   POC PROTEIN,UA negative negative, trace   Urobilinogen, UA 0.2 0.2 or 1.0 E.U./dL   Nitrite, UA Negative Negative   Leukocytes, UA Negative Negative       Assessment & Plan:  Marland KitchenMarland KitchenLuma was seen today for abdominal pain.  Diagnoses and all orders for this visit:  Epigastric abdominal tenderness without rebound tenderness -     Lipase -     COMPLETE METABOLIC PANEL WITH GFR -     CBC with Differential/Platelet -     POCT URINALYSIS DIP (CLINITEK) -     Urine Culture  MORBID OBESITY  Rib pain on left side -     DG Ribs Unilateral W/Chest Left -     diclofenac Sodium (VOLTAREN) 1 % GEL; Apply 4 g topically 4 (four) times daily. To affected area. -     POCT URINALYSIS DIP (CLINITEK) -     orphenadrine (NORFLEX) 100 MG tablet; Take 1 tablet (100 mg total) by mouth 2 (two) times daily as needed for muscle spasms. -     Urine Culture  Left upper quadrant abdominal tenderness without rebound tenderness -     Lipase -     COMPLETE METABOLIC PANEL WITH GFR -     CBC with Differential/Platelet -     POCT URINALYSIS DIP (CLINITEK) -     Urine Culture  Hematuria, unspecified type   Unclear etiology of symptoms.  Due to patient's intermittent history does not sound like infection and I would expect kidney stones to have worsened by now. todays pain does not appear like kidney stones.  Will get x-ray of the ribs and chest.  Since there was some abdominal tenderness we will get CMP, lipase, CBC.  UA was negative for nitrates and leukocytes but positive for trace blood.  Will culture urine.  Recheck urine in 2 weeks to make  sure hematuria has resolved.  Certainly cannot rule out kidney stones however pain is not all the time which is more unlikely. Given some voltaren gel and norflex. Continue on PPI. Seems to happen when she is sitting more. Consider stretching more often. Follow up as needed with any new or worsening symptoms.

## 2020-07-19 NOTE — Patient Instructions (Addendum)
Recheck urine 2 weeks for trace blood. Will culture.  Get xrays.  Get labs.  Try diclofenac gel and norflex as needed.

## 2020-07-20 LAB — COMPLETE METABOLIC PANEL WITH GFR
AG Ratio: 1.5 (calc) (ref 1.0–2.5)
ALT: 16 U/L (ref 6–29)
AST: 17 U/L (ref 10–35)
Albumin: 4.3 g/dL (ref 3.6–5.1)
Alkaline phosphatase (APISO): 73 U/L (ref 37–153)
BUN: 18 mg/dL (ref 7–25)
CO2: 23 mmol/L (ref 20–32)
Calcium: 9.2 mg/dL (ref 8.6–10.4)
Chloride: 105 mmol/L (ref 98–110)
Creat: 0.9 mg/dL (ref 0.50–0.99)
GFR, Est African American: 79 mL/min/{1.73_m2} (ref >=60)
GFR, Est Non African American: 68 mL/min/{1.73_m2} (ref >=60)
Globulin: 2.8 g/dL (calc) (ref 1.9–3.7)
Glucose, Bld: 99 mg/dL (ref 65–99)
Potassium: 4.3 mmol/L (ref 3.5–5.3)
Sodium: 137 mmol/L (ref 135–146)
Total Bilirubin: 0.7 mg/dL (ref 0.2–1.2)
Total Protein: 7.1 g/dL (ref 6.1–8.1)

## 2020-07-20 LAB — CBC WITH DIFFERENTIAL/PLATELET
Absolute Monocytes: 511 cells/uL (ref 200–950)
Basophils Absolute: 62 cells/uL (ref 0–200)
Basophils Relative: 0.9 %
Eosinophils Absolute: 186 cells/uL (ref 15–500)
Eosinophils Relative: 2.7 %
HCT: 41.2 % (ref 35.0–45.0)
Hemoglobin: 14 g/dL (ref 11.7–15.5)
Lymphs Abs: 2243 cells/uL (ref 850–3900)
MCH: 30.4 pg (ref 27.0–33.0)
MCHC: 34 g/dL (ref 32.0–36.0)
MCV: 89.4 fL (ref 80.0–100.0)
MPV: 11.2 fL (ref 7.5–12.5)
Monocytes Relative: 7.4 %
Neutro Abs: 3899 cells/uL (ref 1500–7800)
Neutrophils Relative %: 56.5 %
Platelets: 281 10*3/uL (ref 140–400)
RBC: 4.61 10*6/uL (ref 3.80–5.10)
RDW: 12.5 % (ref 11.0–15.0)
Total Lymphocyte: 32.5 %
WBC: 6.9 10*3/uL (ref 3.8–10.8)

## 2020-07-20 LAB — LIPASE: Lipase: 27 U/L (ref 7–60)

## 2020-07-20 NOTE — Progress Notes (Signed)
Jazari,   Labs look great. No sign of pancreatic inflammation.

## 2020-07-20 NOTE — Progress Notes (Signed)
Rachael Hancock,   No displaced ribs or acute findings.

## 2020-07-21 LAB — URINE CULTURE
MICRO NUMBER:: 11463137
SPECIMEN QUALITY:: ADEQUATE

## 2020-07-21 NOTE — Progress Notes (Signed)
No significant bacteria growth on urine.

## 2020-07-24 ENCOUNTER — Encounter: Payer: Self-pay | Admitting: Physician Assistant

## 2020-07-24 DIAGNOSIS — R10816 Epigastric abdominal tenderness: Secondary | ICD-10-CM | POA: Insufficient documentation

## 2020-07-24 DIAGNOSIS — R0781 Pleurodynia: Secondary | ICD-10-CM | POA: Insufficient documentation

## 2020-07-24 DIAGNOSIS — R319 Hematuria, unspecified: Secondary | ICD-10-CM | POA: Insufficient documentation

## 2020-07-24 DIAGNOSIS — R10812 Left upper quadrant abdominal tenderness: Secondary | ICD-10-CM | POA: Insufficient documentation

## 2020-08-05 ENCOUNTER — Other Ambulatory Visit: Payer: Self-pay | Admitting: Family Medicine

## 2020-08-05 DIAGNOSIS — I10 Essential (primary) hypertension: Secondary | ICD-10-CM

## 2020-08-07 DIAGNOSIS — Z1231 Encounter for screening mammogram for malignant neoplasm of breast: Secondary | ICD-10-CM | POA: Diagnosis not present

## 2020-08-07 LAB — HM MAMMOGRAPHY

## 2020-09-07 ENCOUNTER — Other Ambulatory Visit: Payer: Self-pay | Admitting: Cardiology

## 2020-09-07 ENCOUNTER — Other Ambulatory Visit: Payer: Self-pay | Admitting: Family Medicine

## 2020-09-07 DIAGNOSIS — R931 Abnormal findings on diagnostic imaging of heart and coronary circulation: Secondary | ICD-10-CM

## 2020-09-07 DIAGNOSIS — I10 Essential (primary) hypertension: Secondary | ICD-10-CM

## 2020-10-17 ENCOUNTER — Other Ambulatory Visit: Payer: Self-pay | Admitting: Family Medicine

## 2020-10-17 DIAGNOSIS — I10 Essential (primary) hypertension: Secondary | ICD-10-CM

## 2020-10-26 ENCOUNTER — Encounter: Payer: Self-pay | Admitting: Family Medicine

## 2020-10-26 DIAGNOSIS — R600 Localized edema: Secondary | ICD-10-CM

## 2020-10-26 DIAGNOSIS — I10 Essential (primary) hypertension: Secondary | ICD-10-CM

## 2020-10-27 MED ORDER — FUROSEMIDE 20 MG PO TABS: 20.0000 mg | ORAL_TABLET | Freq: Every day | ORAL | 0 refills | Status: DC | PRN

## 2020-10-27 NOTE — Telephone Encounter (Signed)
Ok to send in 10 tabs but please ask her to make a 6 mo f/u appt for BP, etc. It has actually been 8 months.

## 2020-12-03 ENCOUNTER — Encounter: Payer: Self-pay | Admitting: Family Medicine

## 2020-12-03 DIAGNOSIS — K649 Unspecified hemorrhoids: Secondary | ICD-10-CM

## 2020-12-04 MED ORDER — HYDROCORTISONE ACETATE 25 MG RE SUPP: 25.0000 mg | Freq: Two times a day (BID) | RECTAL | 99 refills | Status: DC | PRN

## 2020-12-04 MED ORDER — HYDROCORTISONE (PERIANAL) 2.5 % EX CREA
1.0000 | TOPICAL_CREAM | Freq: Two times a day (BID) | CUTANEOUS | 99 refills | Status: DC
Start: 2020-12-04 — End: 2022-04-24

## 2020-12-11 NOTE — Addendum Note (Signed)
Addended by: Donella Stade on: 12/11/2020 12:55 PM   Modules accepted: Orders

## 2020-12-19 MED ORDER — PRAMOXINE HCL (PERIANAL) 1 % EX FOAM: 1.0000 "application " | Freq: Three times a day (TID) | CUTANEOUS | 99 refills | Status: DC | PRN

## 2020-12-19 NOTE — Telephone Encounter (Signed)
Protofoam sent to pharmacy to try in addition to the steroid cream.

## 2020-12-19 NOTE — Addendum Note (Signed)
Addended by: Beatrice Lecher D on: 12/19/2020 07:37 AM   Modules accepted: Orders

## 2020-12-20 ENCOUNTER — Other Ambulatory Visit: Payer: Self-pay | Admitting: *Deleted

## 2020-12-20 MED ORDER — PRAMOXINE HCL (PERIANAL) 1 % EX FOAM: 1.0000 "application " | Freq: Three times a day (TID) | CUTANEOUS | 99 refills | Status: DC | PRN

## 2020-12-21 MED ORDER — TRIAMCINOLONE ACETONIDE 0.1 % EX CREA: 1.0000 "application " | TOPICAL_CREAM | Freq: Two times a day (BID) | CUTANEOUS | 0 refills | Status: DC

## 2020-12-21 NOTE — Addendum Note (Signed)
Addended by: Beatrice Lecher D on: 12/21/2020 05:21 PM   Modules accepted: Orders

## 2021-01-10 ENCOUNTER — Telehealth: Payer: Self-pay | Admitting: Neurology

## 2021-01-10 NOTE — Telephone Encounter (Signed)
Prior Authorization for Pramoxine submitted via covermymeds.   The requested medication is not covered under the [employer] formulary.

## 2021-01-15 ENCOUNTER — Other Ambulatory Visit: Payer: Self-pay | Admitting: Family Medicine

## 2021-01-15 DIAGNOSIS — I10 Essential (primary) hypertension: Secondary | ICD-10-CM

## 2021-01-17 ENCOUNTER — Telehealth (INDEPENDENT_AMBULATORY_CARE_PROVIDER_SITE_OTHER): Payer: Federal, State, Local not specified - PPO | Admitting: Family Medicine

## 2021-01-17 ENCOUNTER — Encounter: Payer: Self-pay | Admitting: Family Medicine

## 2021-01-17 ENCOUNTER — Telehealth: Payer: Self-pay

## 2021-01-17 VITALS — BP 114/76

## 2021-01-17 DIAGNOSIS — U071 COVID-19: Secondary | ICD-10-CM

## 2021-01-17 MED ORDER — NIRMATRELVIR/RITONAVIR (PAXLOVID)TABLET: 3.0000 | ORAL_TABLET | Freq: Two times a day (BID) | ORAL | 0 refills | Status: AC

## 2021-01-17 NOTE — Telephone Encounter (Signed)
OK, to hold the rosuvastatin while taking the Paxilovid I do not have Ambien on her med list.  But if she is taking it then she can cut it in half.  And take a half a tab nightly while on the medication.

## 2021-01-17 NOTE — Progress Notes (Signed)
Virtual Visit via Video Note  I connected with Rachael Hancock on 01/17/21 at  9:50 AM EDT by a video enabled telemedicine application and verified that I am speaking with the correct person using two identifiers.   I discussed the limitations of evaluation and management by telemedicine and the availability of in person appointments. The patient expressed understanding and agreed to proceed.  Patient location: at home Provider location: in office  Subjective:    CC: COVID - 19   HPI: Nasal congestion started yesterday. Now has a cough.  Tested pos this AM.  + HA. No fever.  No diarrhea  Actually just did a visit with her husband him yesterday who also has COVID and who unfortunately was getting worse yesterday evening. Went to the ED.    Past medical history, Surgical history, Family history not pertinant except as noted below, Social history, Allergies, and medications have been entered into the medical record, reviewed, and corrections made.    Objective:    General: Speaking clearly in complete sentences without any shortness of breath.  Alert and oriented x3.  Normal judgment. No apparent acute distress.    Impression and Recommendations:    No problem-specific Assessment & Plan notes found for this encounter.  COVID-19-went over current quarantine guidelines. Discussed options.  Will tx with Paxlovid.  He has hypertension, coronary vascular disease, sleep apnea, and obesity.  Call if not better.  Okay to use cough and cold medication as well.  No orders of the defined types were placed in this encounter.   Meds ordered this encounter  Medications   nirmatrelvir/ritonavir EUA (PAXLOVID) TABS    Sig: Take 3 tablets by mouth 2 (two) times daily for 5 days. (Take nirmatrelvir 150 mg two tablets twice daily for 5 days and ritonavir 100 mg one tablet twice daily for 5 days) Patient GFR is 64    Dispense:  30 tablet    Refill:  0     I discussed the assessment and  treatment plan with the patient. The patient was provided an opportunity to ask questions and all were answered. The patient agreed with the plan and demonstrated an understanding of the instructions.   The patient was advised to call back or seek an in-person evaluation if the symptoms worsen or if the condition fails to improve as anticipated.   Beatrice Lecher, MD

## 2021-01-17 NOTE — Telephone Encounter (Signed)
Patient called in. Paxlovid was prescribed after virtual visit today. She received  a call from [facility] saying this med interferes with Ambien and Rosuvastatin . Please advise

## 2021-01-18 NOTE — Telephone Encounter (Signed)
The pharmacy states they were asking about the amlodipine and if she should take 1/2 tablet daily while taking Paxlovid. She has already picked up the prescription. We can call patient with instructions.

## 2021-01-18 NOTE — Telephone Encounter (Signed)
Okay to continue the amlodipine since she is already only on 5 mg anyway.

## 2021-01-19 NOTE — Telephone Encounter (Signed)
Patient advised.

## 2021-01-29 ENCOUNTER — Encounter: Payer: Self-pay | Admitting: Physician Assistant

## 2021-01-29 MED ORDER — AZITHROMYCIN 250 MG PO TABS: ORAL_TABLET | ORAL | 0 refills | Status: DC

## 2021-02-13 ENCOUNTER — Ambulatory Visit: Payer: Federal, State, Local not specified - PPO | Admitting: Physician Assistant

## 2021-02-14 ENCOUNTER — Emergency Department
Admission: EM | Admit: 2021-02-14 | Discharge: 2021-02-14 | Disposition: A | Discharge status: Home / Self Care | Payer: Federal, State, Local not specified - PPO | Source: Home / Self Care | Attending: Family Medicine | Admitting: Family Medicine

## 2021-02-14 ENCOUNTER — Other Ambulatory Visit: Payer: Self-pay

## 2021-02-14 DIAGNOSIS — J0100 Acute maxillary sinusitis, unspecified: Secondary | ICD-10-CM | POA: Diagnosis not present

## 2021-02-14 DIAGNOSIS — G43109 Migraine with aura, not intractable, without status migrainosus: Secondary | ICD-10-CM

## 2021-02-14 MED ORDER — PREDNISONE 20 MG PO TABS: 20.0000 mg | ORAL_TABLET | Freq: Two times a day (BID) | ORAL | 0 refills | Status: DC

## 2021-02-14 MED ORDER — AMOXICILLIN-POT CLAVULANATE 875-125 MG PO TABS: 1.0000 | ORAL_TABLET | Freq: Two times a day (BID) | ORAL | 0 refills | Status: DC

## 2021-02-14 NOTE — ED Provider Notes (Signed)
[facility]    CSN: [id number] Arrival date & time: 02/14/21  1014      History   Chief Complaint Chief Complaint  Patient presents with   Otalgia   Headache   Sore Throat    HPI Rachael Hancock is a 65 y.o. female.   HPI Patient states that she had COVID about a month ago.  She states she got over this and then had persistent symptoms and was given a Z-Pak.  She is not sure she got over those symptoms completely and now has sinus pressure and pain, headache, sore throat, postnasal drip, hoarse voice, recurring migraine.  She has had migraine for many years.  She is getting visual scotomata and headaches almost daily for the last several days.  She does have environmental allergies.  She does take Claritin.  She is not currently using the Flonase that was prescribed for her previously, did not know if it would help.  She is trying to drink plenty of water Past Medical History:  Diagnosis Date   Anxiety    Arthritis    Complication of anesthesia    HA  WITH SPINAL, WISDOM TO AWHILE TO WAKE UP WHEN YOUNGER-    Depression    GERD (gastroesophageal reflux disease)    Headache    MIGRAINE   Hypertension    Morbid obesity ([place])    Palpitations    Reflux    Sleep apnea    BORDERLINE -  UNABLE TO USE CPAP 4-5 YRS AGO    Patient Active Problem List   Diagnosis Date Noted   Epigastric abdominal tenderness without rebound tenderness 07/24/2020   Left upper quadrant abdominal tenderness without rebound tenderness 07/24/2020   Rib pain on left side 07/24/2020   Hematuria 07/24/2020   Aortic atherosclerosis ([place]) 03/10/2020   Subclinical hyperthyroidism 06/09/2019   Left shoulder pain 06/09/2019   Mid back pain on left side 01/08/2019   Status post total shoulder arthroplasty, 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   Left groin pain 11/09/2017   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 09/09/2017   Kidney stone on left side 04/08/2016   Left ureteral stone  03/26/2016   Bilateral lower extremity edema 03/21/2015   Adenomatous colon polyp 08/13/2013   OSA (obstructive sleep apnea) 12/23/2011   MORBID OBESITY 11/07/2009   ANXIETY STATE, UNSPECIFIED 11/07/2009   DEPRESSION, MILD 11/07/2009   PALPITATIONS 08/01/2009   VITAMIN D DEFICIENCY 07/14/2009   LEG CRAMPS 03/15/2009   ALLERGIC RHINITIS 01/13/2009   GERD 01/13/2009   ESSENTIAL HYPERTENSION, BENIGN 08/26/2008   SHOULDER PAIN 08/26/2008    Past Surgical History:  Procedure Laterality Date   ANKLE FRACTURE SURGERY     cyst removed     KIDNEY STONE [place] ARTHROPLASTY Right 01/08/2018   Procedure: RIGHT TOTAL SHOULDER ARTHROPLASTY;  Surgeon: Tania Ade, MD;  Location: [facility];  Service: Orthopedics;  Laterality: Right;   WISDOM TOOTH EXTRACTION      OB History   No obstetric history on file.      Home Medications    Prior to Admission medications   Medication Sig Start Date End Date Taking? Authorizing Provider  amoxicillin-clavulanate (AUGMENTIN) 875-125 MG tablet Take 1 tablet by mouth every 12 (twelve) hours. 02/14/21  Yes Raylene Everts, MD  predniSONE (DELTASONE) 20 MG tablet Take 1 tablet (20 mg total) by mouth 2 (two) times daily with a meal. 02/14/21  Yes Meda Coffee,  Jennette Banker, MD  AMBULATORY NON FORMULARY MEDICATION oral appliance therapy - mouthpiece for cpap machine. 02/25/19   Hali Marry, MD  amLODipine (NORVASC) 5 MG tablet TAKE 1 TABLET (5 MG TOTAL) BY MOUTH DAILY. TAKE 1 TABLET BY MOUTH EVERY DAY 01/15/21   Hali Marry, MD  diclofenac Sodium (VOLTAREN) 1 % GEL Apply 4 g topically 4 (four) times daily. To affected area. 07/19/20   Breeback, Luvenia Starch L, PA-C  furosemide (LASIX) 20 MG tablet Take 1 tablet (20 mg total) by mouth daily as needed for edema. 10/27/20   Hali Marry, MD  hydrocortisone (ANUSOL-HC) 2.5 % rectal cream Place 1 application rectally 2 (two) times daily. 12/04/20   Hali Marry, MD  hydrocortisone (ANUSOL-HC) 25 MG suppository Place 1 suppository (25 mg total) rectally 2 (two) times daily as needed for hemorrhoids or anal itching. 12/04/20   Hali Marry, MD  hydrocortisone cream 1 % Apply 1 application topically 2 (two) times daily as needed for itching.    [provider]  Lactulose 20 GM/30ML SOLN Take 15 mLs (10 g total) by mouth in the morning and at bedtime. 07/10/20   Hali Marry, MD  lisinopril (ZESTRIL) 40 MG tablet TAKE 1 TABLET BY MOUTH EVERY DAY 10/17/20   Hali Marry, MD  loratadine (CLARITIN) 10 MG tablet Take 10 mg by mouth daily.    [provider]  MULTIPLE MINERALS-VITAMINS PO Take 1 tablet by mouth 3 (three) times a week.     [provider]  omeprazole (PRILOSEC) 40 MG capsule TAKE 1 CAPSULE BY MOUTH EVERY DAY 10/17/20   Hali Marry, MD  polyethylene glycol (MIRALAX / GLYCOLAX) 17 g packet Take 17 g by mouth daily.    [provider]  pramoxine (PROCTOFOAM) 1 % foam Place 1 application rectally 3 (three) times daily as needed for anal itching. 12/20/20   Hali Marry, MD  rosuvastatin (CRESTOR) 20 MG tablet TAKE 1 TABLET BY MOUTH EVERY DAY 09/07/20   Lelon Perla, MD  triamcinolone cream (KENALOG) 0.1 % Apply 1 application topically 2 (two) times daily. Only use for one week at a time. 12/21/20   Hali Marry, MD    Family History Family History  Problem Relation Age of Onset   Heart attack Father        at age 74   Cancer Paternal Grandmother    Coronary artery disease Other    Arrhythmia Mother        pacemaker insertion   Diabetes Maternal Aunt     Social History Social History   Tobacco Use   Smoking status: Never   Smokeless tobacco: Never  Vaping Use   Vaping Use: Never used  Substance Use Topics   Alcohol use: Yes    Alcohol/week: 0.0 standard drinks    Comment: Rare   Drug use: No     Allergies   Verapamil   Review  of Systems Review of Systems See HPI  Physical Exam Triage Vital Signs ED Triage Vitals  Enc Vitals Group     BP 02/14/21 1031 130/75     Pulse Rate 02/14/21 1031 73     Resp 02/14/21 1031 18     Temp 02/14/21 1031 98.6 F (37 C)     Temp Source 02/14/21 1031 Oral     SpO2 02/14/21 1031 96 %     Weight --      Height --  Head Circumference --      Peak Flow --      Pain Score 02/14/21 1026 5     Pain Loc --      Pain Edu? --      Excl. in [place]? --    No data found.  Updated Vital Signs BP 130/75 (BP Location: Right Arm)   Pulse 73   Temp 98.6 F (37 C) (Oral)   Resp 18   SpO2 96%      Physical Exam Constitutional:      General: She is not in acute distress.    Appearance: She is well-developed. She is obese.  HENT:     Head: Normocephalic and atraumatic.     Right Ear: Tympanic membrane and ear canal normal.     Left Ear: Tympanic membrane and ear canal normal.     Nose: Congestion and rhinorrhea present.     Mouth/Throat:     Mouth: Mucous membranes are moist.     Pharynx: Posterior oropharyngeal erythema present.     Comments: Mild erythema posterior pharynx.  Sinus tenderness left greater than right maxillary region Eyes:     Conjunctiva/sclera: Conjunctivae normal.     Pupils: Pupils are equal, round, and reactive to light.  Cardiovascular:     Rate and Rhythm: Normal rate and regular rhythm.     Heart sounds: Normal heart sounds.     Comments: Frequent ectopy Pulmonary:     Effort: Pulmonary effort is normal. No respiratory distress.     Breath sounds: Normal breath sounds.  Abdominal:     General: There is no distension.     Palpations: Abdomen is soft.  Musculoskeletal:        General: Normal range of motion.     Cervical back: Normal range of motion.  Skin:    General: Skin is warm and dry.  Neurological:     Mental Status: She is alert.  Psychiatric:        Mood and Affect: Mood normal.     UC Treatments / Results  Labs (all labs  ordered are listed, but only abnormal results are displayed) Labs Reviewed - No data to display  EKG   Radiology No results found.  Procedures Procedures (including critical care time)  Medications Ordered in UC Medications - No data to display  Initial Impression / Assessment and Plan / UC Course  I have reviewed the triage vital signs and the nursing notes.  Pertinent labs & imaging results that were available during my care of the patient were reviewed by me and considered in my medical decision making (see chart for details).     Patient has recently recovered from [place], and feels rundown.  She now has a sinus infection that she believes may be bacterial, its not getting better with her usual home treatment.  We will cover her with antibiotics, prednisone, have her resume her Flonase, follow-up with her primary care Final Clinical Impressions(s) / UC Diagnoses   Final diagnoses:  Acute maxillary sinusitis, recurrence not specified  Migraine with aura and without status migrainosus, not intractable     Discharge Instructions      Take Augmentin 2 times a day for 1 week.  This is an antibiotic.  I recommend that you take a probiotic while on Augmentin to prevent stomach upset Take prednisone 2 times a day for 5 days.  This will take down the swelling and congestion in your sinuses Drink lots of water Use  Flonase daily until symptoms have improved Follow-up with your primary care doctor   ED Prescriptions     Medication Sig Dispense Auth. Provider   amoxicillin-clavulanate (AUGMENTIN) 875-125 MG tablet Take 1 tablet by mouth every 12 (twelve) hours. 14 tablet Raylene Everts, MD   predniSONE (DELTASONE) 20 MG tablet Take 1 tablet (20 mg total) by mouth 2 (two) times daily with a meal. 10 tablet Raylene Everts, MD      PDMP not reviewed this encounter.   Raylene Everts, MD 02/14/21 [phone]

## 2021-02-14 NOTE — Discharge Instructions (Addendum)
Take Augmentin 2 times a day for 1 week.  This is an antibiotic.  I recommend that you take a probiotic while on Augmentin to prevent stomach upset Take prednisone 2 times a day for 5 days.  This will take down the swelling and congestion in your sinuses Drink lots of water Use Flonase daily until symptoms have improved Follow-up with your primary care doctor

## 2021-02-14 NOTE — ED Triage Notes (Signed)
Pt c/o bilateral ear pain and ear pain since Saturday, Headache started first, now ears and teeth hurt. Denies fever. Taking mucinex prn. Pain 5/10   Had COVID a month ago. Vaccinated.

## 2021-02-16 ENCOUNTER — Encounter: Payer: Self-pay | Admitting: Family Medicine

## 2021-02-16 ENCOUNTER — Other Ambulatory Visit: Payer: Self-pay

## 2021-02-16 ENCOUNTER — Ambulatory Visit: Payer: Federal, State, Local not specified - PPO | Admitting: Family Medicine

## 2021-02-16 VITALS — BP 137/71 | HR 76 | Temp 98.6°F | Ht 67.0 in | Wt 337.0 lb

## 2021-02-16 DIAGNOSIS — R1012 Left upper quadrant pain: Secondary | ICD-10-CM | POA: Diagnosis not present

## 2021-02-16 DIAGNOSIS — R1013 Epigastric pain: Secondary | ICD-10-CM | POA: Diagnosis not present

## 2021-02-16 DIAGNOSIS — G43109 Migraine with aura, not intractable, without status migrainosus: Secondary | ICD-10-CM | POA: Diagnosis not present

## 2021-02-16 MED ORDER — ELETRIPTAN HYDROBROMIDE 20 MG PO TABS
20.0000 mg | ORAL_TABLET | Freq: Every day | ORAL | 5 refills | Status: DC | PRN
Start: 2021-02-16 — End: 2022-07-18

## 2021-02-16 NOTE — Assessment & Plan Note (Signed)
Really like for her to do a trial of a triptan.  Prescription sent to pharmacy for Relpax.  She can combine it with Tylenol or an NSAID.

## 2021-02-16 NOTE — Progress Notes (Signed)
Acute Office Visit  Subjective:    Patient ID: Rachael Hancock, female    DOB: 1956/06/24, 65 y.o.   MRN: [id number]  Chief Complaint  Patient presents with   Abdominal Pain    HPI Patient is in today for pain on left side. LUQ pain  x 2 weeks, intermittent.  Rates pain a  6-7 out of 10. Had similar pain.  She had similar pain about a year ago in the left lower quadrant and epigastric area ended up doing a CT at that time and it showed possible constipation she says she is going regularly but feels like a lot of times it small amounts.  She did recently start doing some Metamucil and tries to drink plenty of water.  She has noticed more bloating than usual.  She said a couple of weeks ago she started having left upper quadrant pain and now it is bothering her more in the right upper quadrant.  Though sometimes it just feels like a vice all the way across her upper upper abdomen.  Especially when she bends forward.  Even yesterday just lying in bed on her left side and putting pressure on her side was painful.  She denies any nausea or vomiting.  He has been having a lot more migraines in fact she had 2 bad ones this week.  She usually just relies on over-the-counter medication she does not have any prescription triptan.  She said she did get an aura with the first migraine.  Past Medical History:  Diagnosis Date   Anxiety    Arthritis    Complication of anesthesia    HA  WITH SPINAL, WISDOM TO AWHILE TO WAKE UP WHEN YOUNGER-    Depression    GERD (gastroesophageal reflux disease)    Headache    MIGRAINE   Hypertension    Morbid obesity ([place])    Palpitations    Reflux    Sleep apnea    BORDERLINE -  UNABLE TO USE CPAP 4-5 YRS AGO    Past Surgical History:  Procedure Laterality Date   ANKLE FRACTURE SURGERY     cyst removed     KIDNEY STONE SURGERY     NOVASURE ABLATION     TOTAL SHOULDER ARTHROPLASTY Right 01/08/2018   Procedure: RIGHT TOTAL SHOULDER ARTHROPLASTY;   Surgeon: Tania Ade, MD;  Location: [facility];  Service: Orthopedics;  Laterality: Right;   WISDOM TOOTH EXTRACTION      Family History  Problem Relation Age of Onset   Heart attack Father        at age 64   Cancer Paternal Grandmother    Coronary artery disease Other    Arrhythmia Mother        pacemaker insertion   Diabetes Maternal Aunt     Social History   Socioeconomic History   Marital status: Married    Spouse name: Tim   Number of children: 2   Years of education: 3 YRS COLL   Highest education level: Not on file  Occupational History   Occupation: CUSTOMER SERVICE REP    Employer: SELF    Comment: [employer]  Tobacco Use   Smoking status: Never   Smokeless tobacco: Never  Vaping Use   Vaping Use: Never used  Substance and Sexual Activity   Alcohol use: Yes    Alcohol/week: 0.0 standard drinks    Comment: Rare   Drug use: No   Sexual activity: Not on file  Other  Topics Concern   Not on file  Social History Narrative   CUSTOMER SERVICE REP [place]   [address] [place]   MARRIED TO TIM   1 DAUGHTER   Social Determinants of Health   Financial Resource Strain: Not on file  Food Insecurity: Not on file  Transportation Needs: Not on file  Physical Activity: Not on file  Stress: Not on file  Social Connections: Not on file  Intimate Partner Violence: Not on file    Outpatient Medications Prior to Visit  Medication Sig Dispense Refill   AMBULATORY NON FORMULARY MEDICATION oral appliance therapy - mouthpiece for cpap machine. 1 each 0   amLODipine (NORVASC) 5 MG tablet TAKE 1 TABLET (5 MG TOTAL) BY MOUTH DAILY. TAKE 1 TABLET BY MOUTH EVERY DAY 90 tablet 0   amoxicillin-clavulanate (AUGMENTIN) 875-125 MG tablet Take 1 tablet by mouth every 12 (twelve) hours. 14 tablet 0   furosemide (LASIX) 20 MG tablet Take 1 tablet (20 mg total) by mouth daily as needed for edema. 10 tablet 0   hydrocortisone (ANUSOL-HC) 2.5 % rectal cream Place  1 application rectally 2 (two) times daily. 30 g PRN   hydrocortisone (ANUSOL-HC) 25 MG suppository Place 1 suppository (25 mg total) rectally 2 (two) times daily as needed for hemorrhoids or anal itching. 12 suppository PRN   hydrocortisone cream 1 % Apply 1 application topically 2 (two) times daily as needed for itching.     lisinopril (ZESTRIL) 40 MG tablet TAKE 1 TABLET BY MOUTH EVERY DAY 90 tablet 1   loratadine (CLARITIN) 10 MG tablet Take 10 mg by mouth daily.     MULTIPLE MINERALS-VITAMINS PO Take 1 tablet by mouth 3 (three) times a week.      omeprazole (PRILOSEC) 40 MG capsule TAKE 1 CAPSULE BY MOUTH EVERY DAY 90 capsule 3   polyethylene glycol (MIRALAX / GLYCOLAX) 17 g packet Take 17 g by mouth daily.     predniSONE (DELTASONE) 20 MG tablet Take 1 tablet (20 mg total) by mouth 2 (two) times daily with a meal. 10 tablet 0   rosuvastatin (CRESTOR) 20 MG tablet TAKE 1 TABLET BY MOUTH EVERY DAY 90 tablet 3   triamcinolone cream (KENALOG) 0.1 % Apply 1 application topically 2 (two) times daily. Only use for one week at a time. 30 g 0   diclofenac Sodium (VOLTAREN) 1 % GEL Apply 4 g topically 4 (four) times daily. To affected area. 100 g 1   Lactulose 20 GM/30ML SOLN Take 15 mLs (10 g total) by mouth in the morning and at bedtime. 450 mL 1   pramoxine (PROCTOFOAM) 1 % foam Place 1 application rectally 3 (three) times daily as needed for anal itching. 15 g PRN   No facility-administered medications prior to visit.    Allergies  Allergen Reactions   Verapamil Other (See Comments)    Constipation.     Review of Systems     Objective:    Physical Exam Constitutional:      Appearance: She is well-developed.  HENT:     Head: Normocephalic and atraumatic.  Cardiovascular:     Rate and Rhythm: Normal rate and regular rhythm.     Heart sounds: Normal heart sounds.  Pulmonary:     Effort: Pulmonary effort is normal.     Breath sounds: Normal breath sounds.  Abdominal:      Tenderness: There is abdominal tenderness in the epigastric area and left upper quadrant. There is no guarding or  rebound.  Skin:    General: Skin is warm and dry.  Neurological:     Mental Status: She is alert and oriented to person, place, and time.  Psychiatric:        Behavior: Behavior normal.    BP 137/71   Pulse 76   Temp 98.6 F (37 C) (Oral)   Ht '5\' 7"'$  (1.702 m)   Wt (!) 337 lb (152.9 kg)   SpO2 97% Comment: on RA  BMI 52.78 kg/m  Wt Readings from Last 3 Encounters:  02/16/21 (!) 337 lb (152.9 kg)  07/19/20 (!) 330 lb (149.7 kg)  06/01/20 (!) 322 lb (146.1 kg)    Health Maintenance Due  Topic Date Due   Zoster Vaccines- Shingrix (2 of 2) 04/16/2020   MAMMOGRAM  06/08/2020   COVID-19 Vaccine (4 - Booster for Pfizer series) 08/13/2020   INFLUENZA VACCINE  01/22/2021    There are no preventive care reminders to display for this patient.   Lab Results  Component Value Date   TSH 5.390 (H) 02/15/2020   Lab Results  Component Value Date   WBC 6.9 07/19/2020   HGB 14.0 07/19/2020   HCT 41.2 07/19/2020   MCV 89.4 07/19/2020   PLT 281 07/19/2020   Lab Results  Component Value Date   NA 137 07/19/2020   K 4.3 07/19/2020   CO2 23 07/19/2020   GLUCOSE 99 07/19/2020   BUN 18 07/19/2020   CREATININE 0.90 07/19/2020   BILITOT 0.7 07/19/2020   ALKPHOS 84 02/15/2020   AST 17 07/19/2020   ALT 16 07/19/2020   PROT 7.1 07/19/2020   ALBUMIN 4.4 02/15/2020   CALCIUM 9.2 07/19/2020   ANIONGAP 8 01/09/2018   Lab Results  Component Value Date   CHOL 135 02/15/2020   Lab Results  Component Value Date   HDL 54 02/15/2020   Lab Results  Component Value Date   LDLCALC 65 02/15/2020   Lab Results  Component Value Date   TRIG 81 02/15/2020   Lab Results  Component Value Date   CHOLHDL 2.5 02/15/2020   No results found for: HGBA1C     Assessment & Plan:   Problem List Items Addressed This Visit       Cardiovascular and Mediastinum   Migraine with  aura    Really like for her to do a trial of a triptan.  Prescription sent to pharmacy for Relpax.  She can combine it with Tylenol or an NSAID.      Relevant Medications   eletriptan (RELPAX) 20 MG tablet   Other Visit Diagnoses     LUQ pain    -  Primary   Relevant Orders   COMPLETE METABOLIC PANEL WITH GFR   Lipase   CBC with Differential/Platelet   Epigastric pain          Upper abdominal pain right upper and left upper quadrant pain-unclear etiology.  She was tender on exam in the epigastric area and in the left upper quadrant and a little laterally.  Otherwise nontender.  No nausea or vomiting.  We did discuss adding in MiraLAX 3 days a week or even every other day to help keep her bowels moving regularly.  I would like to do some lab work just to make sure that pancreatic enzymes liver enzymes etc. are normal.  She will come back on Monday to have that drawn.  If not improving after the weekend then consider further work-up or referral to GI.  Meds  ordered this encounter  Medications   eletriptan (RELPAX) 20 MG tablet    Sig: Take 1 tablet (20 mg total) by mouth daily as needed for migraine or headache. May repeat in 2 hours if headache persists or recurs.    Dispense:  10 tablet    Refill:  5      Beatrice Lecher, MD

## 2021-02-19 DIAGNOSIS — R1012 Left upper quadrant pain: Secondary | ICD-10-CM | POA: Diagnosis not present

## 2021-02-20 ENCOUNTER — Encounter: Payer: Self-pay | Admitting: Family Medicine

## 2021-02-20 LAB — COMPLETE METABOLIC PANEL WITH GFR
AG Ratio: 1.4 (calc) (ref 1.0–2.5)
ALT: 22 U/L (ref 6–29)
AST: 13 U/L (ref 10–35)
Albumin: 4 g/dL (ref 3.6–5.1)
Alkaline phosphatase (APISO): 61 U/L (ref 37–153)
BUN: 23 mg/dL (ref 7–25)
CO2: 26 mmol/L (ref 20–32)
Calcium: 9.1 mg/dL (ref 8.6–10.4)
Chloride: 107 mmol/L (ref 98–110)
Creat: 1 mg/dL (ref 0.50–1.05)
Globulin: 2.8 g/dL (calc) (ref 1.9–3.7)
Glucose, Bld: 74 mg/dL (ref 65–99)
Potassium: 4 mmol/L (ref 3.5–5.3)
Sodium: 142 mmol/L (ref 135–146)
Total Bilirubin: 0.4 mg/dL (ref 0.2–1.2)
Total Protein: 6.8 g/dL (ref 6.1–8.1)
eGFR: 63 mL/min/{1.73_m2} (ref >=60)

## 2021-02-20 LAB — CBC WITH DIFFERENTIAL/PLATELET
Absolute Monocytes: 816 cells/uL (ref 200–950)
Basophils Absolute: 38 cells/uL (ref 0–200)
Basophils Relative: 0.4 %
Eosinophils Absolute: 48 cells/uL (ref 15–500)
Eosinophils Relative: 0.5 %
HCT: 40.7 % (ref 35.0–45.0)
Hemoglobin: 13.3 g/dL (ref 11.7–15.5)
Lymphs Abs: 3341 cells/uL (ref 850–3900)
MCH: 29.8 pg (ref 27.0–33.0)
MCHC: 32.7 g/dL (ref 32.0–36.0)
MCV: 91.1 fL (ref 80.0–100.0)
MPV: 10.9 fL (ref 7.5–12.5)
Monocytes Relative: 8.5 %
Neutro Abs: 5357 cells/uL (ref 1500–7800)
Neutrophils Relative %: 55.8 %
Platelets: 228 10*3/uL (ref 140–400)
RBC: 4.47 10*6/uL (ref 3.80–5.10)
RDW: 13.4 % (ref 11.0–15.0)
Total Lymphocyte: 34.8 %
WBC: 9.6 10*3/uL (ref 3.8–10.8)

## 2021-02-20 LAB — LIPASE: Lipase: 80 U/L — ABNORMAL HIGH (ref 7–60)

## 2021-02-20 NOTE — Progress Notes (Signed)
Hi Rachael Hancock, your metabolic panel looks good.  Your lipase was mildly elevated which means or some mild inflammation in your pancreas which does sit in the left side of your abdomen.  Your blood count is normal no sign of elevated white blood cell count.  I want you to eat a bland soft diet for couple of days and then recheck your lipase again at the end of the week to see if it is trending back down to normal or if its still elevated or if it is trending upwards.

## 2021-02-23 ENCOUNTER — Telehealth: Payer: Self-pay | Admitting: *Deleted

## 2021-02-23 DIAGNOSIS — R748 Abnormal levels of other serum enzymes: Secondary | ICD-10-CM

## 2021-02-23 NOTE — Telephone Encounter (Signed)
Labs ordered to recheck Lipase.

## 2021-03-05 DIAGNOSIS — R748 Abnormal levels of other serum enzymes: Secondary | ICD-10-CM | POA: Diagnosis not present

## 2021-03-05 LAB — LIPASE: Lipase: 17 U/L (ref 7–60)

## 2021-03-06 NOTE — Progress Notes (Signed)
Hi Thia, great news!  Your pancreatic enzymes all the way back down to normal which is great.  Hopefully you are feeling well.

## 2021-03-30 ENCOUNTER — Encounter: Payer: Self-pay | Admitting: Family Medicine

## 2021-03-30 NOTE — Progress Notes (Signed)
abstract

## 2021-04-26 ENCOUNTER — Other Ambulatory Visit: Payer: Self-pay | Admitting: Family Medicine

## 2021-04-26 DIAGNOSIS — I10 Essential (primary) hypertension: Secondary | ICD-10-CM

## 2021-04-27 ENCOUNTER — Emergency Department (INDEPENDENT_AMBULATORY_CARE_PROVIDER_SITE_OTHER)
Admission: EM | Admit: 2021-04-27 | Discharge: 2021-04-27 | Disposition: A | Discharge status: Home / Self Care | Payer: Federal, State, Local not specified - PPO | Source: Home / Self Care

## 2021-04-27 ENCOUNTER — Other Ambulatory Visit: Payer: Self-pay

## 2021-04-27 ENCOUNTER — Encounter: Payer: Self-pay | Admitting: Emergency Medicine

## 2021-04-27 DIAGNOSIS — R059 Cough, unspecified: Secondary | ICD-10-CM

## 2021-04-27 DIAGNOSIS — J309 Allergic rhinitis, unspecified: Secondary | ICD-10-CM

## 2021-04-27 MED ORDER — PREDNISONE 20 MG PO TABS: ORAL_TABLET | ORAL | 0 refills | Status: DC

## 2021-04-27 MED ORDER — FEXOFENADINE HCL 180 MG PO TABS: 180.0000 mg | ORAL_TABLET | Freq: Every day | ORAL | 0 refills | Status: DC

## 2021-04-27 MED ORDER — BENZONATATE 200 MG PO CAPS: 200.0000 mg | ORAL_CAPSULE | Freq: Three times a day (TID) | ORAL | 0 refills | Status: AC | PRN

## 2021-04-27 NOTE — ED Triage Notes (Signed)
Cough & congestion since Wednesday night  Took mucinex yesterday & one dose of tessalon perles  OTC medicine cough & cold dried her out No flu vaccine  Adult daughter is also sick

## 2021-04-27 NOTE — ED Provider Notes (Signed)
[facility]    CSN: [id number] Arrival date & time: 04/27/21  1254      History   Chief Complaint Chief Complaint  Patient presents with   Nasal Congestion   Cough     HPI Rachael Hancock is a 65 y.o. female.   HPI 65 year old female presents with cough and congestion for 2 days.  Patient reports taking Mucinex for the past 2 days.  Past Medical History:  Diagnosis Date   Anxiety    Arthritis    Complication of anesthesia    HA  WITH SPINAL, WISDOM TO AWHILE TO WAKE UP WHEN YOUNGER-    Depression    GERD (gastroesophageal reflux disease)    Headache    MIGRAINE   Hypertension    Morbid obesity ([place])    Palpitations    Reflux    Sleep apnea    BORDERLINE -  UNABLE TO USE CPAP 4-5 YRS AGO    Patient Active Problem List   Diagnosis Date Noted   Migraine with aura 02/16/2021   Epigastric abdominal tenderness without rebound tenderness 07/24/2020   Left upper quadrant abdominal tenderness without rebound tenderness 07/24/2020   Rib pain on left side 07/24/2020   Hematuria 07/24/2020   Aortic atherosclerosis ([place]) 03/10/2020   Subclinical hyperthyroidism 06/09/2019   Left shoulder pain 06/09/2019   Mid back pain on left side 01/08/2019   Status post total shoulder arthroplasty, 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   Left groin pain 11/09/2017   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 09/09/2017   Kidney stone on left side 04/08/2016   Left ureteral stone 03/26/2016   Bilateral lower extremity edema 03/21/2015   Adenomatous colon polyp 08/13/2013   OSA (obstructive sleep apnea) 12/23/2011   MORBID OBESITY 11/07/2009   ANXIETY STATE, UNSPECIFIED 11/07/2009   DEPRESSION, MILD 11/07/2009   PALPITATIONS 08/01/2009   VITAMIN D DEFICIENCY 07/14/2009   LEG CRAMPS 03/15/2009   ALLERGIC RHINITIS 01/13/2009   GERD 01/13/2009   ESSENTIAL HYPERTENSION, BENIGN 08/26/2008   SHOULDER PAIN 08/26/2008    Past Surgical History:  Procedure  Laterality Date   ANKLE FRACTURE SURGERY     cyst removed     KIDNEY STONE [place] ARTHROPLASTY Right 01/08/2018   Procedure: RIGHT TOTAL SHOULDER ARTHROPLASTY;  Surgeon: Tania Ade, MD;  Location: [facility];  Service: Orthopedics;  Laterality: Right;   WISDOM TOOTH EXTRACTION      OB History   No obstetric history on file.      Home Medications    Prior to Admission medications   Medication Sig Start Date End Date Taking? Authorizing Provider  fexofenadine (ALLEGRA ALLERGY) 180 MG tablet Take 1 tablet (180 mg total) by mouth daily for 15 days. 04/27/21 05/12/21 Yes Eliezer Lofts, FNP  lisinopril (ZESTRIL) 40 MG tablet TAKE 1 TABLET BY MOUTH EVERY DAY 04/26/21   Hali Marry, MD  predniSONE (DELTASONE) 20 MG tablet Take 3 tabs PO daily x 5 days. 04/27/21  Yes Eliezer Lofts, FNP  AMBULATORY NON FORMULARY MEDICATION oral appliance therapy - mouthpiece for cpap machine. 02/25/19   Hali Marry, MD  amLODipine (NORVASC) 5 MG tablet TAKE 1 TABLET (5 MG TOTAL) BY MOUTH DAILY. TAKE 1 TABLET BY MOUTH EVERY DAY 01/15/21   Hali Marry, MD  amoxicillin-clavulanate (AUGMENTIN) 875-125 MG tablet Take 1 tablet by mouth every 12 (twelve) hours. Patient not taking: Reported on 04/27/2021 02/14/21   Blanchie Serve  Collie Siad, MD  eletriptan (RELPAX) 20 MG tablet Take 1 tablet (20 mg total) by mouth daily as needed for migraine or headache. May repeat in 2 hours if headache persists or recurs. 02/16/21   Hali Marry, MD  furosemide (LASIX) 20 MG tablet Take 1 tablet (20 mg total) by mouth daily as needed for edema. 10/27/20   Hali Marry, MD  hydrocortisone (ANUSOL-HC) 2.5 % rectal cream Place 1 application rectally 2 (two) times daily. 12/04/20   Hali Marry, MD  hydrocortisone (ANUSOL-HC) 25 MG suppository Place 1 suppository (25 mg total) rectally 2 (two) times daily as needed for hemorrhoids or anal itching. 12/04/20    Hali Marry, MD  hydrocortisone cream 1 % Apply 1 application topically 2 (two) times daily as needed for itching.    [provider]  loratadine (CLARITIN) 10 MG tablet Take 10 mg by mouth daily.    [provider]  MULTIPLE MINERALS-VITAMINS PO Take 1 tablet by mouth 3 (three) times a week.     [provider]  omeprazole (PRILOSEC) 40 MG capsule TAKE 1 CAPSULE BY MOUTH EVERY DAY 10/17/20   Hali Marry, MD  polyethylene glycol (MIRALAX / GLYCOLAX) 17 g packet Take 17 g by mouth daily.    [provider]  rosuvastatin (CRESTOR) 20 MG tablet TAKE 1 TABLET BY MOUTH EVERY DAY 09/07/20   Lelon Perla, MD  triamcinolone cream (KENALOG) 0.1 % Apply 1 application topically 2 (two) times daily. Only use for one week at a time. 12/21/20   Hali Marry, MD    Family History Family History  Problem Relation Age of Onset   Heart attack Father        at age 11   Cancer Paternal Grandmother    Coronary artery disease Other    Arrhythmia Mother        pacemaker insertion   Diabetes Maternal Aunt     Social History Social History   Tobacco Use   Smoking status: Never   Smokeless tobacco: Never  Vaping Use   Vaping Use: Never used  Substance Use Topics   Alcohol use: Yes    Alcohol/week: 0.0 standard drinks    Comment: Rare   Drug use: No     Allergies   Verapamil   Review of Systems Review of Systems  HENT:  Positive for congestion.   Respiratory:  Positive for cough.   All other systems reviewed and are negative.   Physical Exam Triage Vital Signs ED Triage Vitals  Enc Vitals Group     BP      Pulse      Resp      Temp      Temp src      SpO2      Weight      Height      Head Circumference      Peak Flow      Pain Score      Pain Loc      Pain Edu?      Excl. in [place]?    No data found.  Updated Vital Signs BP (!) 148/80 (BP Location: Right Arm)   Pulse 75   Temp 98.6 F (37 C) (Oral)   Resp  20   Wt (!) 337 lb 1.3 oz (152.9 kg)   SpO2 99%   BMI 52.79 kg/m    Physical Exam Vitals and nursing note reviewed.  Constitutional:  General: She is not in acute distress.    Appearance: She is obese. She is not ill-appearing.  HENT:     Head: Normocephalic and atraumatic.     Right Ear: Tympanic membrane, ear canal and external ear normal.     Left Ear: Tympanic membrane, ear canal and external ear normal.     Mouth/Throat:     Mouth: Mucous membranes are moist.     Pharynx: Oropharynx is clear.  Eyes:     Extraocular Movements: Extraocular movements intact.     Conjunctiva/sclera: Conjunctivae normal.     Pupils: Pupils are equal, round, and reactive to light.  Cardiovascular:     Rate and Rhythm: Normal rate and regular rhythm.     Pulses: Normal pulses.     Heart sounds: Normal heart sounds.  Pulmonary:     Effort: Pulmonary effort is normal.     Breath sounds: Normal breath sounds. No wheezing, rhonchi or rales.     Comments: Infrequent nonproductive cough noted on exam Musculoskeletal:        General: Normal range of motion.     Cervical back: Normal range of motion and neck supple.  Skin:    General: Skin is warm and dry.  Neurological:     General: No focal deficit present.     Mental Status: She is alert and oriented to person, place, and time.  Psychiatric:        Mood and Affect: Mood normal.        Behavior: Behavior normal.     UC Treatments / Results  Labs (all labs ordered are listed, but only abnormal results are displayed) Labs Reviewed - No data to display  EKG   Radiology No results found.  Procedures Procedures (including critical care time)  Medications Ordered in UC Medications - No data to display  Initial Impression / Assessment and Plan / UC Course  I have reviewed the triage vital signs and the nursing notes.  Pertinent labs & imaging results that were available during my care of the patient were reviewed by me and  considered in my medical decision making (see chart for details).     MDM: 1.  Cough-Rx Prednisone burst; 2.  Allergic rhinitis-Rx'd Allegra. Advised patient to take medication as directed with food to completion.  Advised patient to take Allegra daily for the next 5 to 7 days, then as needed for concurrent postnasal drainage/drip.  Encouraged patient increase daily water intake while taking these medications.  Patient discharged home, hemodynamically stable. Final Clinical Impressions(s) / UC Diagnoses   Final diagnoses:  Cough, unspecified type  Allergic rhinitis, unspecified seasonality, unspecified trigger     Discharge Instructions      Advised patient to take medication as directed with food to completion.  Advised patient to take Allegra daily for the next 5 to 7 days, then as needed for concurrent postnasal drainage/drip.  Encouraged patient increase daily water intake while taking these medications.     ED Prescriptions     Medication Sig Dispense Auth. Provider   predniSONE (DELTASONE) 20 MG tablet Take 3 tabs PO daily x 5 days. 15 tablet Eliezer Lofts, FNP   fexofenadine [facility] ALLERGY) 180 MG tablet Take 1 tablet (180 mg total) by mouth daily for 15 days. 15 tablet Eliezer Lofts, FNP      PDMP not reviewed this encounter.   Eliezer Lofts, [place] 04/27/21 1451

## 2021-04-27 NOTE — Discharge Instructions (Addendum)
Advised patient to take medication as directed with food to completion.  Advised patient to take Allegra daily for the next 5 to 7 days, then as needed for concurrent postnasal drainage/drip.  Encouraged patient increase daily water intake while taking these medications.

## 2021-04-28 ENCOUNTER — Other Ambulatory Visit: Payer: Self-pay | Admitting: Family Medicine

## 2021-04-28 DIAGNOSIS — I10 Essential (primary) hypertension: Secondary | ICD-10-CM

## 2021-04-30 ENCOUNTER — Telehealth: Payer: Self-pay

## 2021-04-30 NOTE — Telephone Encounter (Signed)
LVM for patient to call back to get this appt scheduled. AM

## 2021-04-30 NOTE — Telephone Encounter (Signed)
Please call pt to schedule appt.  No further refills until pt is seen.  T. Browning [place], CMA  

## 2021-05-03 ENCOUNTER — Encounter: Payer: Self-pay | Admitting: Family Medicine

## 2021-05-03 ENCOUNTER — Telehealth (INDEPENDENT_AMBULATORY_CARE_PROVIDER_SITE_OTHER): Payer: Federal, State, Local not specified - PPO | Admitting: Physician Assistant

## 2021-05-03 ENCOUNTER — Encounter: Payer: Self-pay | Admitting: Physician Assistant

## 2021-05-03 DIAGNOSIS — J329 Chronic sinusitis, unspecified: Secondary | ICD-10-CM

## 2021-05-03 DIAGNOSIS — J40 Bronchitis, not specified as acute or chronic: Secondary | ICD-10-CM

## 2021-05-03 MED ORDER — AZITHROMYCIN 250 MG PO TABS: ORAL_TABLET | ORAL | 0 refills | Status: DC

## 2021-05-03 MED ORDER — ALBUTEROL SULFATE HFA 108 (90 BASE) MCG/ACT IN AERS: 2.0000 | INHALATION_SPRAY | Freq: Four times a day (QID) | RESPIRATORY_TRACT | 0 refills | Status: DC | PRN

## 2021-05-03 MED ORDER — HYDROCOD POLST-CPM POLST ER 10-8 MG/5ML PO SUER: 5.0000 mL | Freq: Two times a day (BID) | ORAL | 0 refills | Status: DC | PRN

## 2021-05-03 NOTE — Telephone Encounter (Signed)
I called and left a message for patient to call back to schedule a virtual visit.

## 2021-05-03 NOTE — Progress Notes (Signed)
..Virtual Visit via Telephone Note  I connected with Rachael Hancock on 05/07/21 at  3:40 PM EST by telephone and verified that I am speaking with the correct person using two identifiers.  Location: Patient: home Provider: clinic  .Marland KitchenParticipating in visit:  Patient: Rachael Hancock Provider: Iran Planas PA-C   I discussed the limitations, risks, security and privacy concerns of performing an evaluation and management service by telephone and the availability of in person appointments. I also discussed with the patient that there may be a patient responsible charge related to this service. The patient expressed understanding and agreed to proceed.   History of Present Illness: Pt is a 65 yo obese female who presents to the clinic with cough, congestion for over a week. The whole family has been sick. She went to urgent care and given prednisone and allegra. She has improved very little. The cough is keeping her up at night. Tessalon does help some. No fever, chills, SOB. Negative for covid with home test.   .. 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 09/09/2017   Left groin pain 11/09/2017   Status post total shoulder arthroplasty, 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   Mid back pain on left side 01/08/2019   Subclinical hyperthyroidism 06/09/2019   Left shoulder pain 06/09/2019   Adenomatous colon polyp 08/13/2013   Kidney stone on left side 04/08/2016   Left ureteral stone 03/26/2016   Aortic atherosclerosis ([place]) 03/10/2020    Epigastric abdominal tenderness without rebound tenderness 07/24/2020   Left upper quadrant abdominal tenderness without rebound tenderness 07/24/2020   Rib pain on left side 07/24/2020   Hematuria 07/24/2020   Migraine with aura 02/16/2021   Resolved Ambulatory Problems    Diagnosis Date Noted   LOM 07/14/2009   LEG PAIN 05/23/2009   HTN (hypertension) 08/13/2013   Past Medical History:  Diagnosis Date   Anxiety    Arthritis    Complication of anesthesia    Depression    GERD (gastroesophageal reflux disease)    Headache    Hypertension    Reflux    Sleep apnea       Observations/Objective: No acute distress Normal breathing but productive cough noted.  Flushed cheeks.   Marland Kitchen.There were no vitals filed for this visit. There is no height or weight on file to calculate BMI.    Assessment and Plan: Marland KitchenMarland KitchenAlishah was seen today for cough.  Diagnoses and all orders for this visit:  Sinobronchitis -     chlorpheniramine-HYDROcodone ([place]) 10-8 MG/5ML SUER; Take 5 mLs by mouth every 12 (twelve) hours as needed. -     albuterol (VENTOLIN HFA) 108 (90 Base) MCG/ACT inhaler; Inhale 2 puffs into the lungs every 6 (six) hours as needed. -     azithromycin (ZITHROMAX Z-PAK) 250 MG tablet; Take 2 tablets (500 mg) on  Day 1,  followed by 1 tablet (250 mg) once daily on Days 2 through 5.  Zpak added.  Albuterol and cough syrup for bedtime.  Continue symptomatic care.  Rest and hydrate.  Follow up with any breathing  difficultly.    Follow Up Instructions:    I discussed the assessment and treatment plan with the patient. The patient was provided an opportunity to ask questions and all were answered. The patient agreed with the plan and demonstrated an understanding of the instructions.   The patient was advised to call back or seek an in-person evaluation if the symptoms worsen or if the condition fails to improve as anticipated.     Iran Planas, PA-C

## 2021-05-07 ENCOUNTER — Encounter: Payer: Self-pay | Admitting: Physician Assistant

## 2021-05-07 DIAGNOSIS — J329 Chronic sinusitis, unspecified: Secondary | ICD-10-CM

## 2021-05-07 DIAGNOSIS — J40 Bronchitis, not specified as acute or chronic: Secondary | ICD-10-CM

## 2021-05-09 MED ORDER — HYDROCOD POLST-CPM POLST ER 10-8 MG/5ML PO SUER: 5.0000 mL | Freq: Two times a day (BID) | ORAL | 0 refills | Status: DC | PRN

## 2021-05-14 ENCOUNTER — Other Ambulatory Visit: Payer: Self-pay | Admitting: Family Medicine

## 2021-05-14 DIAGNOSIS — I10 Essential (primary) hypertension: Secondary | ICD-10-CM

## 2021-05-14 NOTE — Progress Notes (Signed)
HPI: FU CP and palpitations. Previous monitors showed sinus with PACs and PVCs. Last echocardiogram April 2021 showed normal LV function, mild left ventricular enlargement, grade 1 diastolic dysfunction. Calcium score April 2021 12 which was 68 percentile.  Lower extremity venous Doppler September 2021 showed no DVT in the left lower extremity.  Seen previously at the dentist office and her heart rate was felt to be irregular.  We did order a monitor at last office visit but not performed.  Since last seen, she denies dyspnea, chest pain, palpitations or syncope.  Current Outpatient Medications  Medication Sig Dispense Refill   albuterol (VENTOLIN HFA) 108 (90 Base) MCG/ACT inhaler Inhale 2 puffs into the lungs every 6 (six) hours as needed. 6.7 g 0   AMBULATORY NON FORMULARY MEDICATION oral appliance therapy - mouthpiece for cpap machine. 1 each 0   amLODipine (NORVASC) 5 MG tablet Take 1 tablet (5 mg total) by mouth daily. TAKE 1 TABLET BY MOUTH EVERY DAY.  OFFICE VISIT REQUIRED PRIOR TO ANY FURTHER REFILLS 30 tablet 0   eletriptan (RELPAX) 20 MG tablet Take 1 tablet (20 mg total) by mouth daily as needed for migraine or headache. May repeat in 2 hours if headache persists or recurs. 10 tablet 5   furosemide (LASIX) 20 MG tablet Take 1 tablet (20 mg total) by mouth daily as needed for edema. 10 tablet 0   hydrocortisone (ANUSOL-HC) 2.5 % rectal cream Place 1 application rectally 2 (two) times daily. 30 g PRN   hydrocortisone (ANUSOL-HC) 25 MG suppository Place 1 suppository (25 mg total) rectally 2 (two) times daily as needed for hemorrhoids or anal itching. 12 suppository PRN   hydrocortisone cream 1 % Apply 1 application topically 2 (two) times daily as needed for itching.     lisinopril (ZESTRIL) 40 MG tablet TAKE 1 TABLET BY MOUTH EVERY DAY 90 tablet 1   loratadine (CLARITIN) 10 MG tablet Take 10 mg by mouth daily.     MULTIPLE MINERALS-VITAMINS PO Take 1 tablet by mouth 3 (three) times  a week.      omeprazole (PRILOSEC) 40 MG capsule TAKE 1 CAPSULE BY MOUTH EVERY DAY 90 capsule 3   polyethylene glycol (MIRALAX / GLYCOLAX) 17 g packet Take 17 g by mouth daily.     rosuvastatin (CRESTOR) 20 MG tablet TAKE 1 TABLET BY MOUTH EVERY DAY 90 tablet 3   triamcinolone cream (KENALOG) 0.1 % Apply 1 application topically 2 (two) times daily. Only use for one week at a time. 30 g 0   azithromycin (ZITHROMAX Z-PAK) 250 MG tablet Take 2 tablets (500 mg) on  Day 1,  followed by 1 tablet (250 mg) once daily on Days 2 through 5. (Patient not taking: Reported on 05/23/2021) 6 tablet 0   chlorpheniramine-HYDROcodone (TUSSIONEX) 10-8 MG/5ML SUER Take 5 mLs by mouth every 12 (twelve) hours as needed. (Patient not taking: Reported on 05/23/2021) 70 mL 0   fexofenadine (ALLEGRA ALLERGY) 180 MG tablet Take 1 tablet (180 mg total) by mouth daily for 15 days. (Patient not taking: Reported on 05/23/2021) 15 tablet 0   No current facility-administered medications for this visit.     Past Medical History:  Diagnosis Date   Anxiety    Arthritis    Complication of anesthesia    HA  WITH SPINAL, WISDOM TO AWHILE TO WAKE UP WHEN YOUNGER-    Depression    GERD (gastroesophageal reflux disease)    Headache    MIGRAINE  Hypertension    Morbid obesity ([place])    Palpitations    Reflux    Sleep apnea    BORDERLINE -  UNABLE TO USE CPAP 4-5 YRS AGO    Past Surgical History:  Procedure Laterality Date   ANKLE FRACTURE SURGERY     cyst removed     KIDNEY STONE SURGERY     NOVASURE ABLATION     TOTAL SHOULDER ARTHROPLASTY Right 01/08/2018   Procedure: RIGHT TOTAL SHOULDER ARTHROPLASTY;  Surgeon: Tania Ade, MD;  Location: [facility];  Service: Orthopedics;  Laterality: Right;   WISDOM TOOTH EXTRACTION      Social History   Socioeconomic History   Marital status: Married    Spouse name: Tim   Number of children: 2   Years of education: 3 YRS COLL   Highest education level: Not on file   Occupational History   Occupation: CUSTOMER SERVICE REP    Employer: SELF    Comment: [employer]  Tobacco Use   Smoking status: Never   Smokeless tobacco: Never  Vaping Use   Vaping Use: Never used  Substance and Sexual Activity   Alcohol use: Yes    Alcohol/week: 0.0 standard drinks    Comment: Rare   Drug use: No   Sexual activity: Not on file  Other Topics Concern   Not on file  Social History Narrative   CUSTOMER SERVICE REP [place]   [address] [place]   1 DAUGHTER   Social Determinants of Health   Financial Resource Strain: Not on file  Food Insecurity: Not on file  Transportation Needs: Not on file  Physical Activity: Not on file  Stress: Not on file  Social Connections: Not on file  Intimate Partner Violence: Not on file    Family History  Problem Relation Age of Onset   Heart attack Father        at age 31   Cancer Paternal Grandmother    Coronary artery disease Other    Arrhythmia Mother        pacemaker insertion   Diabetes Maternal Aunt     ROS: no fevers or chills, productive cough, hemoptysis, dysphasia, odynophagia, melena, hematochezia, dysuria, hematuria, rash, seizure activity, orthopnea, PND, pedal edema, claudication. Remaining systems are negative.  Physical Exam: Well-developed morbidly obese in no acute distress.  Skin is warm and dry.  HEENT is normal.  Neck is supple.  Chest is clear to auscultation with normal expansion.  Cardiovascular exam is regular rate and rhythm.  Abdominal exam nontender or distended. No masses palpated. Extremities show no edema. neuro grossly intact  Electrocardiogram today shows sinus rhythm with PVCs, no ST changes.  Personally reviewed.  A/P  1 mildly elevated calcium score-she denies chest pain.  Continue statin.  2 hypertension-blood pressure mildly elevated.  I have asked her to track this and we will advance medications if needed.  I will likely  increase amlodipine if necessary.  3 palpitations-symptoms are improved.  We will consider the addition of beta-blockade in the future if needed.  4 morbid obesity-we discussed importance of diet, exercise and weight loss.  Kirk Ruths, MD

## 2021-05-23 ENCOUNTER — Ambulatory Visit: Payer: Federal, State, Local not specified - PPO | Admitting: Cardiology

## 2021-05-23 ENCOUNTER — Other Ambulatory Visit: Payer: Self-pay

## 2021-05-23 ENCOUNTER — Encounter: Payer: Self-pay | Admitting: Cardiology

## 2021-05-23 VITALS — BP 148/74 | HR 91 | Ht 67.0 in | Wt 326.4 lb

## 2021-05-23 DIAGNOSIS — I10 Essential (primary) hypertension: Secondary | ICD-10-CM

## 2021-05-23 DIAGNOSIS — R002 Palpitations: Secondary | ICD-10-CM | POA: Diagnosis not present

## 2021-05-23 DIAGNOSIS — R931 Abnormal findings on diagnostic imaging of heart and coronary circulation: Secondary | ICD-10-CM | POA: Diagnosis not present

## 2021-05-23 NOTE — Patient Instructions (Signed)
  Follow-Up: At [facility], you and your health needs are our priority.  As part of our continuing mission to provide you with exceptional heart care, we have created designated Provider Care Teams.  These Care Teams include your primary Cardiologist (physician) and Advanced Practice Providers (APPs -  Physician Assistants and Nurse Practitioners) who all work together to provide you with the care you need, when you need it.  We recommend signing up for the patient portal called "MyChart".  Sign up information is provided on this After Visit Summary.  MyChart is used to connect with patients for Virtual Visits (Telemedicine).  Patients are able to view lab/test results, encounter notes, upcoming appointments, etc.  Non-urgent messages can be sent to your provider as well.   To learn more about what you can do with MyChart, go to [email or web address].    Your next appointment:   12 month(s)  The format for your next appointment:   In Person  Provider:   Kirk Ruths MD

## 2021-05-28 ENCOUNTER — Encounter: Payer: Self-pay | Admitting: Cardiology

## 2021-05-28 DIAGNOSIS — I10 Essential (primary) hypertension: Secondary | ICD-10-CM

## 2021-05-28 MED ORDER — AMLODIPINE BESYLATE 10 MG PO TABS: 10.0000 mg | ORAL_TABLET | Freq: Every day | ORAL | 3 refills | Status: DC

## 2021-05-29 DIAGNOSIS — Z01419 Encounter for gynecological examination (general) (routine) without abnormal findings: Secondary | ICD-10-CM | POA: Diagnosis not present

## 2021-05-29 DIAGNOSIS — Z6841 Body Mass Index (BMI) 40.0 and over, adult: Secondary | ICD-10-CM | POA: Diagnosis not present

## 2021-05-31 NOTE — Addendum Note (Signed)
Addended by: Jacqulynn Cadet on: 05/31/2021 02:14 PM   Modules accepted: Orders

## 2021-06-01 ENCOUNTER — Ambulatory Visit: Payer: Federal, State, Local not specified - PPO | Admitting: Family Medicine

## 2021-06-01 ENCOUNTER — Encounter: Payer: Self-pay | Admitting: Family Medicine

## 2021-06-01 ENCOUNTER — Other Ambulatory Visit: Payer: Self-pay

## 2021-06-01 VITALS — BP 133/65 | HR 86 | Temp 97.9°F | Resp 18 | Ht 67.5 in | Wt 337.0 lb

## 2021-06-01 DIAGNOSIS — Z23 Encounter for immunization: Secondary | ICD-10-CM

## 2021-06-01 DIAGNOSIS — J019 Acute sinusitis, unspecified: Secondary | ICD-10-CM

## 2021-06-01 MED ORDER — DOXYCYCLINE HYCLATE 100 MG PO TABS: 100.0000 mg | ORAL_TABLET | Freq: Two times a day (BID) | ORAL | 0 refills | Status: DC

## 2021-06-01 NOTE — Patient Instructions (Signed)
Increase your omeprazole to twice a day for 10 days and then you can go back down to once a day.

## 2021-06-01 NOTE — Progress Notes (Signed)
Acute Office Visit  Subjective:    Patient ID: Rachael Hancock, female    DOB: April 23, 1956, 65 y.o.   MRN: [id number]  Chief Complaint  Patient presents with   Hypertension    Follow up    Cough    Slight productive cough, Head congestion, hoarse voice, Left side abdomen pain/mainly when coughing, 5 weeks     HPI Patient is in today for Cough it looks like she was seen initially for cough at urgent care on November 4.  She was treated initially with prednisone and Allegra.  She then did a video visit on November 10 for sinusitis and was treated with azithromycin, cough syrup, and albuterol.  Unfortunately she reports that she is still not feeling tremendously better.  Still complaining of a slightly productive cough with persistent head congestion and hoarse voice.  She is also had a little bit of left-sided abdominal pain mainly when coughing for about the last 5 weeks.  Past Medical History:  Diagnosis Date   Anxiety    Arthritis    Complication of anesthesia    HA  WITH SPINAL, WISDOM TO AWHILE TO WAKE UP WHEN YOUNGER-    Depression    GERD (gastroesophageal reflux disease)    Headache    MIGRAINE   Hypertension    Morbid obesity ([place])    Palpitations    Reflux    Sleep apnea    BORDERLINE -  UNABLE TO USE CPAP 4-5 YRS AGO    Past Surgical History:  Procedure Laterality Date   ANKLE FRACTURE SURGERY     cyst removed     KIDNEY STONE SURGERY     NOVASURE ABLATION     TOTAL SHOULDER ARTHROPLASTY Right 01/08/2018   Procedure: RIGHT TOTAL SHOULDER ARTHROPLASTY;  Surgeon: Tania Ade, MD;  Location: [facility];  Service: Orthopedics;  Laterality: Right;   WISDOM TOOTH EXTRACTION      Family History  Problem Relation Age of Onset   Heart attack Father        at age 27   Cancer Paternal Grandmother    Coronary artery disease Other    Arrhythmia Mother        pacemaker insertion   Diabetes Maternal Aunt     Social History   Socioeconomic History   Marital  status: Married    Spouse name: Tim   Number of children: 2   Years of education: 3 YRS COLL   Highest education level: Not on file  Occupational History   Occupation: CUSTOMER SERVICE REP    Employer: SELF    Comment: [employer]  Tobacco Use   Smoking status: Never   Smokeless tobacco: Never  Vaping Use   Vaping Use: Never used  Substance and Sexual Activity   Alcohol use: Yes    Alcohol/week: 0.0 standard drinks    Comment: Rare   Drug use: No   Sexual activity: Not on file  Other Topics Concern   Not on file  Social History Narrative   CUSTOMER SERVICE REP [place]   3 [place] TO TIM   1 DAUGHTER   Social Determinants of Health   Financial Resource Strain: Not on file  Food Insecurity: Not on file  Transportation Needs: Not on file  Physical Activity: Not on file  Stress: Not on file  Social Connections: Not on file  Intimate Partner Violence: Not on file    Outpatient Medications Prior to Visit  Medication Sig Dispense Refill   albuterol (VENTOLIN HFA) 108 (90 Base) MCG/ACT inhaler Inhale 2 puffs into the lungs every 6 (six) hours as needed. 6.7 g 0   AMBULATORY NON FORMULARY MEDICATION oral appliance therapy - mouthpiece for cpap machine. 1 each 0   amLODipine (NORVASC) 10 MG tablet Take 1 tablet (10 mg total) by mouth daily. 90 tablet 3   eletriptan (RELPAX) 20 MG tablet Take 1 tablet (20 mg total) by mouth daily as needed for migraine or headache. May repeat in 2 hours if headache persists or recurs. 10 tablet 5   furosemide (LASIX) 20 MG tablet Take 1 tablet (20 mg total) by mouth daily as needed for edema. 10 tablet 0   hydrocortisone (ANUSOL-HC) 2.5 % rectal cream Place 1 application rectally 2 (two) times daily. 30 g PRN   hydrocortisone (ANUSOL-HC) 25 MG suppository Place 1 suppository (25 mg total) rectally 2 (two) times daily as needed for hemorrhoids or anal itching. 12 suppository PRN   hydrocortisone cream 1 %  Apply 1 application topically 2 (two) times daily as needed for itching.     lisinopril (ZESTRIL) 40 MG tablet TAKE 1 TABLET BY MOUTH EVERY DAY 90 tablet 1   loratadine (CLARITIN) 10 MG tablet Take 10 mg by mouth daily.     MULTIPLE MINERALS-VITAMINS PO Take 1 tablet by mouth 3 (three) times a week.      omeprazole (PRILOSEC) 40 MG capsule TAKE 1 CAPSULE BY MOUTH EVERY DAY 90 capsule 3   polyethylene glycol (MIRALAX / GLYCOLAX) 17 g packet Take 17 g by mouth daily.     rosuvastatin (CRESTOR) 20 MG tablet TAKE 1 TABLET BY MOUTH EVERY DAY 90 tablet 3   triamcinolone cream (KENALOG) 0.1 % Apply 1 application topically 2 (two) times daily. Only use for one week at a time. 30 g 0   azithromycin (ZITHROMAX Z-PAK) 250 MG tablet Take 2 tablets (500 mg) on  Day 1,  followed by 1 tablet (250 mg) once daily on Days 2 through 5. 6 tablet 0   chlorpheniramine-HYDROcodone (TUSSIONEX) 10-8 MG/5ML SUER Take 5 mLs by mouth every 12 (twelve) hours as needed. (Patient not taking: Reported on 05/23/2021) 70 mL 0   fexofenadine (ALLEGRA ALLERGY) 180 MG tablet Take 1 tablet (180 mg total) by mouth daily for 15 days. (Patient not taking: Reported on 05/23/2021) 15 tablet 0   No facility-administered medications prior to visit.    Allergies  Allergen Reactions   Verapamil Other (See Comments)    Constipation.     Review of Systems     Objective:    Physical Exam Constitutional:      Appearance: She is well-developed.  HENT:     Head: Normocephalic and atraumatic.     Right Ear: External ear normal.     Left Ear: External ear normal.     Nose: Nose normal.  Eyes:     Conjunctiva/sclera: Conjunctivae normal.     Pupils: Pupils are equal, round, and reactive to light.  Neck:     Thyroid: No thyromegaly.  Cardiovascular:     Rate and Rhythm: Normal rate and regular rhythm.     Heart sounds: Normal heart sounds.  Pulmonary:     Effort: Pulmonary effort is normal.     Breath sounds: Normal breath  sounds. No wheezing.  Musculoskeletal:     Cervical back: Neck supple.  Lymphadenopathy:     Cervical: No cervical adenopathy.  Skin:    General: Skin  is warm and dry.  Neurological:     Mental Status: She is alert and oriented to person, place, and time.    BP 133/65   Pulse 86   Temp 97.9 F (36.6 C)   Resp 18   Ht 5' 7.5" (1.715 m)   Wt (!) 337 lb (152.9 kg)   SpO2 99%   BMI 52.00 kg/m  Wt Readings from Last 3 Encounters:  06/01/21 (!) 337 lb (152.9 kg)  05/23/21 (!) 326 lb 6.4 oz (148.1 kg)  04/27/21 (!) 337 lb 1.3 oz (152.9 kg)    Health Maintenance Due  Topic Date Due   PAP SMEAR-Modifier  07/02/2021    There are no preventive care reminders to display for this patient.   Lab Results  Component Value Date   TSH 5.390 (H) 02/15/2020   Lab Results  Component Value Date   WBC 9.6 02/19/2021   HGB 13.3 02/19/2021   HCT 40.7 02/19/2021   MCV 91.1 02/19/2021   PLT 228 02/19/2021   Lab Results  Component Value Date   NA 142 02/19/2021   K 4.0 02/19/2021   CO2 26 02/19/2021   GLUCOSE 74 02/19/2021   BUN 23 02/19/2021   CREATININE 1.00 02/19/2021   BILITOT 0.4 02/19/2021   ALKPHOS 84 02/15/2020   AST 13 02/19/2021   ALT 22 02/19/2021   PROT 6.8 02/19/2021   ALBUMIN 4.4 02/15/2020   CALCIUM 9.1 02/19/2021   ANIONGAP 8 01/09/2018   EGFR 63 02/19/2021   Lab Results  Component Value Date   CHOL 135 02/15/2020   Lab Results  Component Value Date   HDL 54 02/15/2020   Lab Results  Component Value Date   LDLCALC 65 02/15/2020   Lab Results  Component Value Date   TRIG 81 02/15/2020   Lab Results  Component Value Date   CHOLHDL 2.5 02/15/2020   No results found for: HGBA1C     Assessment & Plan:   Problem List Items Addressed This Visit   None Visit Diagnoses     Need for influenza vaccination    -  Primary   Acute non-recurrent sinusitis, unspecified location       Relevant Medications   doxycycline (VIBRA-TABS) 100 MG tablet    Need for immunization against influenza       Relevant Orders   Flu Vaccine QUAD 9moIM (Fluarix, Fluzone & Alfiuria Quad PF) (Completed)      Acute sinusitis-I think at this point her cough is mostly coming from postnasal drip and drainage she feels that the cough is mostly in her throat area and she is getting a lot of hoarseness and laryngitis on and off.  She probably has a lot of inflammation of the vocal cords.  We discussed treating with doxycycline and increasing her PPI to twice a day at least for a week and a half to see if we can reduce some potential reflux it could be aggravating her vocal cords and see if she is feeling better.  If she is not then please let uKoreaknow.  Make sure hydrating well.  If still not improving also consider lisinopril's as a potential cause of the persistent cough.  Flu vaccine given today.  Meds ordered this encounter  Medications   doxycycline (VIBRA-TABS) 100 MG tablet    Sig: Take 1 tablet (100 mg total) by mouth 2 (two) times daily.    Dispense:  20 tablet    Refill:  0  Beatrice Lecher, MD

## 2021-06-04 ENCOUNTER — Other Ambulatory Visit: Payer: Self-pay | Admitting: Family Medicine

## 2021-06-04 DIAGNOSIS — I10 Essential (primary) hypertension: Secondary | ICD-10-CM

## 2021-06-08 ENCOUNTER — Other Ambulatory Visit: Payer: Self-pay | Admitting: Physician Assistant

## 2021-06-08 DIAGNOSIS — R0781 Pleurodynia: Secondary | ICD-10-CM

## 2021-06-13 ENCOUNTER — Other Ambulatory Visit: Payer: Self-pay | Admitting: Family Medicine

## 2021-06-13 DIAGNOSIS — R600 Localized edema: Secondary | ICD-10-CM

## 2021-06-13 DIAGNOSIS — I10 Essential (primary) hypertension: Secondary | ICD-10-CM

## 2021-06-22 ENCOUNTER — Encounter: Payer: Self-pay | Admitting: Family Medicine

## 2021-06-25 MED ORDER — SEMAGLUTIDE-WEIGHT MANAGEMENT 0.5 MG/0.5ML ~~LOC~~ SOAJ: 0.5000 mg | SUBCUTANEOUS | 0 refills | Status: AC

## 2021-06-25 MED ORDER — SEMAGLUTIDE-WEIGHT MANAGEMENT 0.25 MG/0.5ML ~~LOC~~ SOAJ: 0.2500 mg | SUBCUTANEOUS | 0 refills | Status: AC

## 2021-06-28 ENCOUNTER — Telehealth: Payer: Self-pay

## 2021-06-28 NOTE — Telephone Encounter (Signed)
Medication: Semaglutide-Weight Management Prior authorization submitted via CoverMyMeds on 06/28/2021 PA submission pending

## 2021-06-28 NOTE — Telephone Encounter (Signed)
Medication: Semaglutide-Weight Management Prior authorization determination received Medication has been approved Approval dates: 05/29/2021-12/25/2021  Patient aware via: [place] aware: Yes Provider aware via this encounter

## 2021-08-03 ENCOUNTER — Other Ambulatory Visit: Payer: Self-pay | Admitting: Family Medicine

## 2021-08-03 DIAGNOSIS — R600 Localized edema: Secondary | ICD-10-CM

## 2021-08-03 DIAGNOSIS — I10 Essential (primary) hypertension: Secondary | ICD-10-CM

## 2021-08-08 ENCOUNTER — Encounter: Payer: Self-pay | Admitting: Family Medicine

## 2021-08-08 NOTE — Telephone Encounter (Signed)
Okay for letter which seem to make sure that we transfer this note into his chart as well.

## 2021-08-20 DIAGNOSIS — Z1231 Encounter for screening mammogram for malignant neoplasm of breast: Secondary | ICD-10-CM | POA: Diagnosis not present

## 2021-08-24 ENCOUNTER — Other Ambulatory Visit: Payer: Self-pay | Admitting: Cardiology

## 2021-08-24 DIAGNOSIS — R931 Abnormal findings on diagnostic imaging of heart and coronary circulation: Secondary | ICD-10-CM

## 2021-09-25 ENCOUNTER — Ambulatory Visit (INDEPENDENT_AMBULATORY_CARE_PROVIDER_SITE_OTHER): Payer: Federal, State, Local not specified - PPO

## 2021-09-25 ENCOUNTER — Encounter: Payer: Self-pay | Admitting: Medical-Surgical

## 2021-09-25 ENCOUNTER — Ambulatory Visit: Payer: Federal, State, Local not specified - PPO | Admitting: Medical-Surgical

## 2021-09-25 VITALS — BP 143/65 | HR 77 | Ht 67.5 in | Wt 328.0 lb

## 2021-09-25 DIAGNOSIS — M542 Cervicalgia: Secondary | ICD-10-CM

## 2021-09-25 DIAGNOSIS — M5412 Radiculopathy, cervical region: Secondary | ICD-10-CM

## 2021-09-25 DIAGNOSIS — R200 Anesthesia of skin: Secondary | ICD-10-CM | POA: Diagnosis not present

## 2021-09-25 DIAGNOSIS — R202 Paresthesia of skin: Secondary | ICD-10-CM

## 2021-09-25 MED ORDER — PREDNISONE 50 MG PO TABS: 50.0000 mg | ORAL_TABLET | Freq: Every day | ORAL | 0 refills | Status: DC

## 2021-09-25 MED ORDER — MELOXICAM 15 MG PO TABS: 15.0000 mg | ORAL_TABLET | Freq: Every day | ORAL | 0 refills | Status: DC

## 2021-09-25 NOTE — Progress Notes (Signed)
?  HPI with pertinent ROS:  ? ?CC: Neck pain ? ?HPI: ?Pleasant 66 year old female presenting today for evaluation of neck pain been present for the last week and a half.  She originally thought she slept wrong and got a crick in her neck the discomfort did not improve.  Notes that the pain is along the paraspinal muscles as well as the midline of the neck from the base of the skull down to her upper back between the scapulae.  Pain also extends along the left side of the neck into the upper trapezius.  Notes that she also had an episode of left hand tingling but is unable to recall which fingers were affected.  This lasted only a few minutes and resolved when she moved her arm around to a different position. ? ?I reviewed the past medical history, family history, social history, surgical history, and allergies today and no changes were needed.  Please see the problem list section below in epic for further details. ? ? ?Physical exam:  ? ?General: Well Developed, well nourished, and in no acute distress.  ?Neuro: Alert and oriented x3.  ?HEENT: Normocephalic, atraumatic, pupils equal round reactive to light, neck supple, no masses, no lymphadenopathy, thyroid nonpalpable.  ?Neck: Mild tenderness to bilateral paraspinal muscles and midline cervical spine.  Tenderness extends along the trapezius.  Neurovascularly intact to bilateral upper extremities.  Normal range of motion and strength. ?Skin: Warm and dry. ?Cardiac: Regular rate and rhythm.  ?Respiratory: Not using accessory muscles, speaking in full sentences. ? ?Impression and Recommendations:   ? ?1. Cervicalgia ?2. Cervical radiculopathy ?With no other symptoms, suspect her neck pain and associated numbness to her left hand are related to cervical radiculopathy and degenerative changes in the cervical spine.  Getting x-rays updated today.  Adding a burst of prednisone 50 mg daily x5.  After that, start meloxicam 15 mg daily.  Home exercises printed and provided  to patient along with AVS. ?- DG Cervical Spine Complete; Future ? ?Return for neck pain follow up in 4-6 weeks. ?___________________________________________ ?Clearnce Sorrel, DNP, APRN, FNP-BC ?Primary Care and Sports Medicine ?[place] ?

## 2021-10-01 ENCOUNTER — Encounter: Payer: Self-pay | Admitting: Family Medicine

## 2021-10-01 DIAGNOSIS — I10 Essential (primary) hypertension: Secondary | ICD-10-CM

## 2021-10-01 DIAGNOSIS — R600 Localized edema: Secondary | ICD-10-CM

## 2021-10-01 MED ORDER — FUROSEMIDE 20 MG PO TABS: 20.0000 mg | ORAL_TABLET | Freq: Every day | ORAL | 2 refills | Status: DC | PRN

## 2021-10-01 NOTE — Telephone Encounter (Signed)
Pended for #30 tablets. Sign if appropriate.  ?

## 2021-10-18 ENCOUNTER — Other Ambulatory Visit: Payer: Self-pay | Admitting: Medical-Surgical

## 2021-10-28 ENCOUNTER — Other Ambulatory Visit: Payer: Self-pay | Admitting: Family Medicine

## 2021-11-07 ENCOUNTER — Other Ambulatory Visit: Payer: Self-pay | Admitting: Family Medicine

## 2021-11-07 DIAGNOSIS — I10 Essential (primary) hypertension: Secondary | ICD-10-CM

## 2021-11-09 ENCOUNTER — Telehealth: Payer: Self-pay | Admitting: Family Medicine

## 2021-11-09 NOTE — Telephone Encounter (Signed)
This call patient remind her that she is due for a Pap smear.  This should be hopefully her last 1 if the results come back normal but she was due in January for her 5-year repeat.

## 2021-11-09 NOTE — Telephone Encounter (Signed)
LVM asking that she call back to schedule appointment or if she has been seen by another provider for this to call and update this information.

## 2021-11-12 ENCOUNTER — Encounter: Payer: Self-pay | Admitting: Family Medicine

## 2021-11-13 NOTE — Telephone Encounter (Signed)
Pt states that she has had a pap smear either in December or early 2023.  Faxed records request to [facility].  Charyl Bigger, CMA

## 2021-12-30 ENCOUNTER — Other Ambulatory Visit: Payer: Self-pay | Admitting: Family Medicine

## 2021-12-30 DIAGNOSIS — R600 Localized edema: Secondary | ICD-10-CM

## 2021-12-30 DIAGNOSIS — I10 Essential (primary) hypertension: Secondary | ICD-10-CM

## 2021-12-31 NOTE — Telephone Encounter (Signed)
V.Mail left for pateint to return call and schedule appt.

## 2021-12-31 NOTE — Telephone Encounter (Signed)
Please inform pt that she will need to have labs and she is due for an OV with pcp for bp

## 2022-01-07 ENCOUNTER — Other Ambulatory Visit: Payer: Self-pay | Admitting: Family Medicine

## 2022-01-07 DIAGNOSIS — K649 Unspecified hemorrhoids: Secondary | ICD-10-CM

## 2022-01-08 ENCOUNTER — Encounter: Payer: Self-pay | Admitting: Medical-Surgical

## 2022-01-08 ENCOUNTER — Ambulatory Visit: Payer: Federal, State, Local not specified - PPO | Admitting: Medical-Surgical

## 2022-01-08 VITALS — BP 133/75 | HR 74 | Resp 20 | Ht 67.5 in | Wt 330.6 lb

## 2022-01-08 DIAGNOSIS — J0100 Acute maxillary sinusitis, unspecified: Secondary | ICD-10-CM

## 2022-01-08 MED ORDER — AZITHROMYCIN 250 MG PO TABS: ORAL_TABLET | ORAL | 0 refills | Status: AC

## 2022-01-08 MED ORDER — METHYLPREDNISOLONE 4 MG PO TBPK: ORAL_TABLET | ORAL | 0 refills | Status: DC

## 2022-01-08 MED ORDER — FLUCONAZOLE 150 MG PO TABS: 150.0000 mg | ORAL_TABLET | Freq: Once | ORAL | 0 refills | Status: AC

## 2022-01-08 NOTE — Progress Notes (Signed)
Established Patient Office Visit  Subjective   Patient ID: Rachael Hancock, female   DOB: 03/14/1956 Age: 66 y.o. MRN: [id number]   Chief Complaint  Patient presents with   SINUS PRESSURE    HPI Pleasant 66 year old female presenting today with complaints of 2 weeks of sinus pressure, a tightness in her sinuses, intermittent hoarse voice, difficulty breathing through her nose, ear pressure, and intermittent are as as if she is getting a migraine.  She did have a migraine when her symptoms started a couple of weeks ago but has not had a full-blown 1 since then.  No recent exposure to any illness and has not had any fevers, chills, nausea, vomiting, or diarrhea.  She does occasionally cough up some mucus however she is unable to get out any nasal discharge when blowing.  Has tried Zyrtec and Benadryl but did not have much relief.  No other interventions tried.  Reports that she is very worried that her husband is extremely ill with COPD and a possible upcoming evaluation for lung transplant.  Notes that she cannot afford to be sick and when she got short of breath with all these other symptoms, her first thought was to make sure her heart was okay.  Objective:    Vitals:   01/08/22 1351  BP: 133/75  Pulse: 74  Resp: 20  Height: 5' 7.5" (1.715 m)  Weight: (!) 330 lb 9.6 oz (150 kg)  SpO2: 99%  BMI (Calculated): 50.99   Physical Exam Vitals and nursing note reviewed.  Constitutional:      General: She is not in acute distress.    Appearance: Normal appearance. She is not ill-appearing.  HENT:     Head: Normocephalic and atraumatic.     Right Ear: Tympanic membrane, ear canal and external ear normal. There is no impacted cerumen.     Left Ear: Tympanic membrane, ear canal and external ear normal. There is no impacted cerumen.     Nose: Congestion present.     Right Sinus: Maxillary sinus tenderness present.     Left Sinus: Maxillary sinus tenderness present.     Mouth/Throat:      Mouth: Mucous membranes are moist.     Pharynx: No oropharyngeal exudate.  Eyes:     General:        Right eye: No discharge.        Left eye: No discharge.     Extraocular Movements: Extraocular movements intact.     Pupils: Pupils are equal, round, and reactive to light.  Cardiovascular:     Rate and Rhythm: Normal rate and regular rhythm.     Pulses: Normal pulses.     Heart sounds: Normal heart sounds.  Pulmonary:     Effort: Pulmonary effort is normal. No respiratory distress.     Breath sounds: Normal breath sounds. No wheezing, rhonchi or rales.  Musculoskeletal:     Cervical back: Neck supple. No tenderness.  Lymphadenopathy:     Cervical: No cervical adenopathy.  Skin:    General: Skin is warm and dry.  Neurological:     Mental Status: She is alert and oriented to person, place, and time.  Psychiatric:        Mood and Affect: Mood normal.        Behavior: Behavior normal.        Thought Content: Thought content normal.        Judgment: Judgment normal.   No results found for this or any  previous visit (from the past 24 hour(s)).     The 10-year ASCVD risk score (Arnett DK, et al., 2019) is: 5.8%   Values used to calculate the score:     Age: 65 years     Sex: Female     Is Non-Hispanic African American: No     Diabetic: No     Tobacco smoker: No     Systolic Blood Pressure: 161 mmHg     Is BP treated: Yes     HDL Cholesterol: 54 mg/dL     Total Cholesterol: 135 mg/dL   Assessment & Plan:   1. Acute non-recurrent maxillary sinusitis Exam and history consistent with acute maxillary sinusitis.  Treating with azithromycin, Diflucan for vaginal yeast prophylaxis, and a Medrol Dosepak due to the severity of her congestion and inflammation.  Continue conservative measures including pushing p.o. fluids, using a coolmist humidifier, saline nasal spray.  Consider using Mucinex to loosen secretions.  Continue Zyrtec.  Add Flonase. - azithromycin (ZITHROMAX) 250 MG  tablet; Take 2 tablets on day 1, then 1 tablet daily on days 2 through 5  Dispense: 6 tablet; Refill: 0 - fluconazole (DIFLUCAN) 150 MG tablet; Take 1 tablet (150 mg total) by mouth once for 1 dose.  Dispense: 1 tablet; Refill: 0 - methylPREDNISolone (MEDROL DOSEPAK) 4 MG TBPK tablet; 6-day pack as directed  Dispense: 21 tablet; Refill: 0  Return if symptoms worsen or fail to improve.  ___________________________________________ Clearnce Sorrel, DNP, APRN, FNP-BC Primary Care and [place]

## 2022-02-12 ENCOUNTER — Ambulatory Visit: Payer: Federal, State, Local not specified - PPO | Admitting: Sports Medicine

## 2022-02-12 ENCOUNTER — Ambulatory Visit (INDEPENDENT_AMBULATORY_CARE_PROVIDER_SITE_OTHER): Payer: Federal, State, Local not specified - PPO

## 2022-02-12 DIAGNOSIS — M7062 Trochanteric bursitis, left hip: Secondary | ICD-10-CM | POA: Diagnosis not present

## 2022-02-12 MED ORDER — CELECOXIB 200 MG PO CAPS: ORAL_CAPSULE | ORAL | 2 refills | Status: DC

## 2022-02-12 NOTE — Progress Notes (Signed)
    Procedures performed today:    None.  Independent interpretation of notes and tests performed by another provider:   None.  Brief History, Exam, Impression, and Recommendations:    Trochanteric bursitis, left hip This is a pleasant 66 year old female, she has a long history of left hip pain, localized laterally, on exam she has tenderness over the greater trochanter, she really does not have much pain with internal rotation though it is limited. Suspect greater trochanteric bursitis, discussed the pathophysiology, getting updated x-rays, switching from over-the-counter ibuprofen to Celebrex, adding home conditioning, after September she will be able to do additional formal physical therapy if needed, return to see me in 6 weeks.    ____________________________________________ Gwen Her. Dianah Field, M.D., ABFM., CAQSM., AME. Primary Care and Sports Medicine [facility]  Adjunct Professor of [employer]  Risk manager

## 2022-02-12 NOTE — Assessment & Plan Note (Signed)
This is a pleasant 66 year old female, she has a long history of left hip pain, localized laterally, on exam she has tenderness over the greater trochanter, she really does not have much pain with internal rotation though it is limited. Suspect greater trochanteric bursitis, discussed the pathophysiology, getting updated x-rays, switching from over-the-counter ibuprofen to Celebrex, adding home conditioning, after September she will be able to do additional formal physical therapy if needed, return to see me in 6 weeks.

## 2022-04-04 ENCOUNTER — Encounter: Payer: Self-pay | Admitting: Family Medicine

## 2022-04-05 ENCOUNTER — Telehealth: Payer: Self-pay

## 2022-04-05 NOTE — Telephone Encounter (Signed)
Initiated Prior authorization IDX:[id number] '25MG'$  suppositorie Via: Covermymeds Case/Key:[id number]  Status: denied as of 04/05/22 Reason:Your requested medication belongs to a class of drugs called Less Than Effective Drug Efficacy Study Implementation ([place]) Drugs. DESI drugs are excluded from coverage under Medicare rules Notified Pt via: Mychart

## 2022-04-24 ENCOUNTER — Ambulatory Visit (INDEPENDENT_AMBULATORY_CARE_PROVIDER_SITE_OTHER): Payer: Medicare Other | Admitting: Physician Assistant

## 2022-04-24 VITALS — BP 146/68 | HR 83 | Ht 67.5 in | Wt 281.0 lb

## 2022-04-24 DIAGNOSIS — R202 Paresthesia of skin: Secondary | ICD-10-CM | POA: Diagnosis not present

## 2022-04-24 DIAGNOSIS — R931 Abnormal findings on diagnostic imaging of heart and coronary circulation: Secondary | ICD-10-CM | POA: Diagnosis not present

## 2022-04-24 DIAGNOSIS — K649 Unspecified hemorrhoids: Secondary | ICD-10-CM

## 2022-04-24 DIAGNOSIS — K149 Disease of tongue, unspecified: Secondary | ICD-10-CM

## 2022-04-24 MED ORDER — MAGIC MOUTHWASH W/LIDOCAINE: 15.0000 mL | Freq: Four times a day (QID) | ORAL | 0 refills | Status: DC | PRN

## 2022-04-24 MED ORDER — HYDROCORTISONE (PERIANAL) 2.5 % EX CREA: 1.0000 | TOPICAL_CREAM | Freq: Two times a day (BID) | CUTANEOUS | 11 refills | Status: DC

## 2022-04-24 NOTE — Patient Instructions (Signed)
Magic mouthwash  

## 2022-04-24 NOTE — Progress Notes (Signed)
Acute Office Visit  Subjective:     Patient ID: Rachael Hancock, female    DOB: 1955-11-26, 66 y.o.   MRN: [id number]  Chief Complaint  Patient presents with   Hemorrhoids    HPI Patient is in today for tongue tingling since last week. Seems to be worse in morning and better throughout the day. No swelling or problems swallowing. No new toothpaste, mouthwash or foods. She did go to ER and get keflex and diflucan for UTI and yeast infection. She finished last week. No fever, chills, speech changes, vision changes, strength changes. She has been on lisinopril for a long time but worries about allergic reaction.    Insurance is not paying for her hemorrhoid suppositories and she is having to use preparation H. She wonders if anything they will pay for.   .. 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 09/09/2017   Left groin pain 11/09/2017   Status post total shoulder arthroplasty, 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   Mid back pain on left side 01/08/2019   Subclinical hyperthyroidism 06/09/2019   Left shoulder pain 06/09/2019   Adenomatous colon polyp 08/13/2013   Kidney stone on left side 04/08/2016   Left ureteral stone 03/26/2016   Aortic atherosclerosis ([place]) 03/10/2020   Epigastric abdominal tenderness without rebound tenderness 07/24/2020   Left upper quadrant abdominal tenderness without rebound tenderness 07/24/2020   Rib pain on left side 07/24/2020   Hematuria 07/24/2020   Migraine with aura 02/16/2021   Trochanteric  bursitis, left hip 02/12/2022   Hemorrhoids 04/24/2022   Resolved Ambulatory Problems    Diagnosis Date Noted   LOM 07/14/2009   LEG PAIN 05/23/2009   HTN (hypertension) 08/13/2013   Past Medical History:  Diagnosis Date   Anxiety    Arthritis    Complication of anesthesia    Depression    GERD (gastroesophageal reflux disease)    Headache    Hypertension    Reflux    Sleep apnea       ROS See HPI.      Objective:    BP (!) 146/68   Pulse 83   Ht 5' 7.5" (1.715 m)   Wt 281 lb (127.5 kg)   SpO2 99%   BMI 43.36 kg/m  BP Readings from Last 3 Encounters:  04/24/22 (!) 146/68  01/08/22 133/75  09/25/21 (!) 143/65   Wt Readings from Last 3 Encounters:  04/26/22 281 lb (127.5 kg)  04/24/22 281 lb (127.5 kg)  01/08/22 (!) 330 lb 9.6 oz (150 kg)      Physical Exam Constitutional:      Appearance: Normal appearance. She is obese.  HENT:     Head: Normocephalic.     Right Ear: Tympanic membrane, ear canal and external ear normal. There is no impacted cerumen.     Left Ear: Tympanic membrane, ear canal and external ear normal. There is no impacted cerumen.     Nose: Nose normal.     Mouth/Throat:     Mouth: Mucous membranes are moist.  Pharynx: No oropharyngeal exudate or posterior oropharyngeal erythema.     Comments: Normal tongue, no abnormality seen.  Eyes:     Conjunctiva/sclera: Conjunctivae normal.  Cardiovascular:     Rate and Rhythm: Normal rate.  Pulmonary:     Effort: Pulmonary effort is normal.     Breath sounds: Normal breath sounds.  Musculoskeletal:     Cervical back: No tenderness.     Right lower leg: No edema.     Left lower leg: No edema.  Lymphadenopathy:     Cervical: No cervical adenopathy.  Neurological:     General: No focal deficit present.     Mental Status: She is alert and oriented to person, place, and time.  Psychiatric:        Mood and Affect: Mood normal.         Assessment & Plan:  Marland KitchenMarland KitchenDeirdra was seen today for  hemorrhoids.  Diagnoses and all orders for this visit:  Tongue irritation -     magic mouthwash w/lidocaine SOLN; Take 15 mLs by mouth 4 (four) times daily as needed for mouth pain. Swish, gargle and spit.  Hemorrhoids, unspecified hemorrhoid type -     hydrocortisone (ANUSOL-HC) 2.5 % rectal cream; Place 1 Application rectally 2 (two) times daily.  Tingling sensation -     magic mouthwash w/lidocaine SOLN; Take 15 mLs by mouth 4 (four) times daily as needed for mouth pain. Swish, gargle and spit.   Unclear etiology Neuro intact No visual changes of tongue or yeast Magic mouthwash ordered If symptoms continue we can think about medication elimination  Sent hemorrhoid cream for patient that appears to be covered.  Iran Planas, PA-C

## 2022-04-25 ENCOUNTER — Encounter: Payer: Self-pay | Admitting: Family Medicine

## 2022-04-25 ENCOUNTER — Encounter: Payer: Self-pay | Admitting: Physician Assistant

## 2022-04-25 DIAGNOSIS — I10 Essential (primary) hypertension: Secondary | ICD-10-CM

## 2022-04-26 ENCOUNTER — Encounter: Payer: Self-pay | Admitting: Sports Medicine

## 2022-04-26 ENCOUNTER — Ambulatory Visit (INDEPENDENT_AMBULATORY_CARE_PROVIDER_SITE_OTHER): Payer: Medicare Other | Admitting: Sports Medicine

## 2022-04-26 ENCOUNTER — Ambulatory Visit (INDEPENDENT_AMBULATORY_CARE_PROVIDER_SITE_OTHER): Payer: Medicare Other

## 2022-04-26 VITALS — Wt 281.0 lb

## 2022-04-26 DIAGNOSIS — M7062 Trochanteric bursitis, left hip: Secondary | ICD-10-CM

## 2022-04-26 DIAGNOSIS — R931 Abnormal findings on diagnostic imaging of heart and coronary circulation: Secondary | ICD-10-CM | POA: Diagnosis not present

## 2022-04-26 MED ORDER — TRIAMCINOLONE ACETONIDE 40 MG/ML IJ SUSP
40.0000 mg | Freq: Once | INTRAMUSCULAR | Status: AC
  Administered 2022-04-26: 40 mg via INTRAMUSCULAR

## 2022-04-26 MED ORDER — HYDROCHLOROTHIAZIDE 25 MG PO TABS: 25.0000 mg | ORAL_TABLET | Freq: Every day | ORAL | 3 refills | Status: DC

## 2022-04-26 NOTE — Addendum Note (Signed)
Addended by: Narda Rutherford on: 04/26/2022 01:38 PM   Modules accepted: Orders

## 2022-04-26 NOTE — Addendum Note (Signed)
Addended by: Tarri Glenn A on: 04/26/2022 04:38 PM   Modules accepted: Orders

## 2022-04-26 NOTE — Progress Notes (Signed)
    Procedures performed today:    Procedure: Real-time Ultrasound Guided injection of the left greater trochanteric bursa Device: Samsung HS60  Verbal informed consent obtained.  Time-out conducted.  Noted no overlying erythema, induration, or other signs of local infection.  Skin prepped in a sterile fashion.  Local anesthesia: Topical Ethyl chloride.  With sterile technique and under real time ultrasound guidance: Noted intact hip abductors, 1 cc Kenalog 40, 2 cc lidocaine, 2 cc bupivacaine injected easily Completed without difficulty  Advised to call if fevers/chills, erythema, induration, drainage, or persistent bleeding.  Images permanently stored and available for review in PACS.  Impression: Technically successful ultrasound guided injection.  Independent interpretation of notes and tests performed by another provider:   None.  Brief History, Exam, Impression, and Recommendations:    Trochanteric bursitis, left hip Jenne returns, she is a 66 year old female with morbid obesity, long history of left hip pain localized laterally over the greater trochanter. She really did not have much pain with internal rotation though it is somewhat limited. Suspected more of a greater trochanteric bursitis, we added some home physical therapy, which she has really not been able to do, not surprisingly still having pain, we injected her trochanteric bursa today, she will get aggressive with home conditioning, if still weak hip abduction at the follow-up we will place her into formal PT.    ____________________________________________ Gwen Her. Dianah Field, M.D., ABFM., CAQSM., AME. Primary Care and Sports Medicine [facility]  Adjunct Professor of [employer] [facility] [employer]  Risk manager

## 2022-04-26 NOTE — Assessment & Plan Note (Signed)
Rachael Hancock returns, she is a 66 year old female with morbid obesity, long history of left hip pain localized laterally over the greater trochanter. She really did not have much pain with internal rotation though it is somewhat limited. Suspected more of a greater trochanteric bursitis, we added some home physical therapy, which she has really not been able to do, not surprisingly still having pain, we injected her trochanteric bursa today, she will get aggressive with home conditioning, if still weak hip abduction at the follow-up we will place her into formal PT.

## 2022-04-29 ENCOUNTER — Encounter: Payer: Self-pay | Admitting: Family Medicine

## 2022-04-30 ENCOUNTER — Encounter: Payer: Self-pay | Admitting: Family Medicine

## 2022-04-30 MED ORDER — VALSARTAN 320 MG PO TABS: 320.0000 mg | ORAL_TABLET | Freq: Every day | ORAL | 3 refills | Status: DC

## 2022-05-02 ENCOUNTER — Encounter: Payer: Self-pay | Admitting: Family Medicine

## 2022-05-02 ENCOUNTER — Telehealth: Payer: Self-pay | Admitting: Cardiology

## 2022-05-02 ENCOUNTER — Ambulatory Visit (INDEPENDENT_AMBULATORY_CARE_PROVIDER_SITE_OTHER): Payer: Medicare Other | Admitting: Family Medicine

## 2022-05-02 ENCOUNTER — Ambulatory Visit (INDEPENDENT_AMBULATORY_CARE_PROVIDER_SITE_OTHER): Payer: Medicare Other

## 2022-05-02 VITALS — BP 131/56 | HR 90 | Ht 67.5 in | Wt 281.0 lb

## 2022-05-02 DIAGNOSIS — I10 Essential (primary) hypertension: Secondary | ICD-10-CM

## 2022-05-02 DIAGNOSIS — R0789 Other chest pain: Secondary | ICD-10-CM | POA: Diagnosis not present

## 2022-05-02 DIAGNOSIS — R931 Abnormal findings on diagnostic imaging of heart and coronary circulation: Secondary | ICD-10-CM

## 2022-05-02 DIAGNOSIS — R072 Precordial pain: Secondary | ICD-10-CM

## 2022-05-02 LAB — COMPLETE METABOLIC PANEL WITH GFR
AG Ratio: 1.4 (calc) (ref 1.0–2.5)
ALT: 15 U/L (ref 6–29)
AST: 12 U/L (ref 10–35)
Albumin: 4.5 g/dL (ref 3.6–5.1)
Alkaline phosphatase (APISO): 73 U/L (ref 37–153)
BUN/Creatinine Ratio: 29 (calc) — ABNORMAL HIGH (ref 6–22)
BUN: 38 mg/dL — ABNORMAL HIGH (ref 7–25)
CO2: 24 mmol/L (ref 20–32)
Calcium: 9.4 mg/dL (ref 8.6–10.4)
Chloride: 103 mmol/L (ref 98–110)
Creat: 1.29 mg/dL — ABNORMAL HIGH (ref 0.50–1.05)
Globulin: 3.2 g/dL (calc) (ref 1.9–3.7)
Glucose, Bld: 98 mg/dL (ref 65–99)
Potassium: 3.9 mmol/L (ref 3.5–5.3)
Sodium: 139 mmol/L (ref 135–146)
Total Bilirubin: 0.7 mg/dL (ref 0.2–1.2)
Total Protein: 7.7 g/dL (ref 6.1–8.1)
eGFR: 46 mL/min/{1.73_m2} — ABNORMAL LOW (ref >=60)

## 2022-05-02 LAB — CBC WITH DIFFERENTIAL/PLATELET
Absolute Monocytes: 776 cells/uL (ref 200–950)
Basophils Absolute: 39 cells/uL (ref 0–200)
Basophils Relative: 0.4 %
Eosinophils Absolute: 136 cells/uL (ref 15–500)
Eosinophils Relative: 1.4 %
HCT: 43.5 % (ref 35.0–45.0)
Hemoglobin: 14.6 g/dL (ref 11.7–15.5)
Lymphs Abs: 2949 cells/uL (ref 850–3900)
MCH: 29.7 pg (ref 27.0–33.0)
MCHC: 33.6 g/dL (ref 32.0–36.0)
MCV: 88.6 fL (ref 80.0–100.0)
MPV: 10.8 fL (ref 7.5–12.5)
Monocytes Relative: 8 %
Neutro Abs: 5801 cells/uL (ref 1500–7800)
Neutrophils Relative %: 59.8 %
Platelets: 332 10*3/uL (ref 140–400)
RBC: 4.91 10*6/uL (ref 3.80–5.10)
RDW: 12.8 % (ref 11.0–15.0)
Total Lymphocyte: 30.4 %
WBC: 9.7 10*3/uL (ref 3.8–10.8)

## 2022-05-02 LAB — D-DIMER, QUANTITATIVE: D-Dimer, Quant: 0.71 mcg/mL FEU — ABNORMAL HIGH (ref <0.50)

## 2022-05-02 LAB — TROPONIN I: Troponin I: 5 ng/L (ref <=47)

## 2022-05-02 NOTE — Telephone Encounter (Signed)
Pt had a referral sent over by her PCP wanting her to f/u with Dr. Stanford Breed. Pt wants to know if she can do a virtual/tele visit instead of in person

## 2022-05-02 NOTE — Progress Notes (Unsigned)
Acute Office Visit  Subjective:     Patient ID: Rachael Hancock, female    DOB: 01-18-1956, 66 y.o.   MRN: [id number]  Chief Complaint  Patient presents with   Hypertension   Gastroesophageal Reflux    HPI Patient is in today for chest pain 2 days ago.  She says she feels like it is at the lower sternum and radiates up towards that left breast.  She is even had a little discomfort on and off towards that left shoulder.  It does not necessarily occur with activity it can happen at rest but its been coming and going its been there even on and off today she wonders if some of it could be GERD though she is not necessarily belching more than usual she denies any shortness of breath or recent illnesses.  She has been under a lot of stress recently.    She has been tracking her blood pressures for the last few weeks and she has had some numbers that have been really up and down.  In fact over MyChart we had recommended switching her blood pressure regimen Regimen but the pharmacy did not have the medications in.  So she has been back on lisinopril for a few days and has seen some better blood pressures.  ROS      Objective:    BP (!) 131/56   Pulse 90   Ht 5' 7.5" (1.715 m)   Wt 281 lb (127.5 kg)   SpO2 97%   BMI 43.36 kg/m  {Vitals History (Optional):23777}  Physical Exam Vitals and nursing note reviewed.  Constitutional:      Appearance: She is well-developed.  HENT:     Head: Normocephalic and atraumatic.     Nose: Nose normal.  Eyes:     Conjunctiva/sclera: Conjunctivae normal.  Cardiovascular:     Rate and Rhythm: Normal rate and regular rhythm.     Heart sounds: Normal heart sounds.  Pulmonary:     Effort: Pulmonary effort is normal.     Breath sounds: Normal breath sounds.  Skin:    General: Skin is warm and dry.  Neurological:     Mental Status: She is alert and oriented to person, place, and time.  Psychiatric:        Behavior: Behavior normal.      Results for orders placed or performed in visit on 05/02/22  CBC with Differential/Platelet  Result Value Ref Range   WBC 9.7 3.8 - 10.8 Thousand/uL   RBC 4.91 3.80 - 5.10 Million/uL   Hemoglobin 14.6 11.7 - 15.5 g/dL   HCT 43.5 35.0 - 45.0 %   MCV 88.6 80.0 - 100.0 fL   MCH 29.7 27.0 - 33.0 pg   MCHC 33.6 32.0 - 36.0 g/dL   RDW 12.8 11.0 - 15.0 %   Platelets 332 140 - 400 Thousand/uL   MPV 10.8 7.5 - 12.5 fL   Neutro Abs 5,801 1,500 - 7,800 cells/uL   Lymphs Abs 2,949 850 - 3,900 cells/uL   Absolute Monocytes 776 200 - 950 cells/uL   Eosinophils Absolute 136 15 - 500 cells/uL   Basophils Absolute 39 0 - 200 cells/uL   Neutrophils Relative % 59.8 %   Total Lymphocyte 30.4 %   Monocytes Relative 8.0 %   Eosinophils Relative 1.4 %   Basophils Relative 0.4 %  COMPLETE METABOLIC PANEL WITH GFR  Result Value Ref Range   Glucose, Bld 98 65 - 99 mg/dL   BUN  38 (H) 7 - 25 mg/dL   Creat 1.29 (H) 0.50 - 1.05 mg/dL   eGFR 46 (L) > OR = 60 mL/min/1.91m   BUN/Creatinine Ratio 29 (H) 6 - 22 (calc)   Sodium 139 135 - 146 mmol/L   Potassium 3.9 3.5 - 5.3 mmol/L   Chloride 103 98 - 110 mmol/L   CO2 24 20 - 32 mmol/L   Calcium 9.4 8.6 - 10.4 mg/dL   Total Protein 7.7 6.1 - 8.1 g/dL   Albumin 4.5 3.6 - 5.1 g/dL   Globulin 3.2 1.9 - 3.7 g/dL (calc)   AG Ratio 1.4 1.0 - 2.5 (calc)   Total Bilirubin 0.7 0.2 - 1.2 mg/dL   Alkaline phosphatase (APISO) 73 37 - 153 U/L   AST 12 10 - 35 U/L   ALT 15 6 - 29 U/L  Troponin I  Result Value Ref Range   Troponin I 5 < OR = 47 ng/L  D-Dimer, Quantitative  Result Value Ref Range   D-Dimer, Quant 0.71 (H) <0.50 mcg/mL FEU        Assessment & Plan:   Problem List Items Addressed This Visit       Cardiovascular and Mediastinum   Essential hypertension, benign    BP overall ok today, a little borderline. Will check once a day and follow over the next 2 weeks and then make adjustments if needed. Will continue with lisinopril.         Other Visit Diagnoses     Atypical chest pain    -  Primary   Relevant Orders   EKG 12-Lead   CBC with Differential/Platelet (Completed)   COMPLETE METABOLIC PANEL WITH GFR (Completed)   Troponin I (Completed)   D-Dimer, Quantitative (Completed)   DG Chest 2 View   Ambulatory referral to Cardiology       EKG shows   No orders of the defined types were placed in this encounter.   No follow-ups on file.  CBeatrice Lecher MD

## 2022-05-02 NOTE — Assessment & Plan Note (Signed)
BP overall ok today, a little borderline. Will check once a day and follow over the next 2 weeks and then make adjustments if needed. Will continue with lisinopril.

## 2022-05-02 NOTE — Telephone Encounter (Signed)
Spoke with pt, she reports she is to be out of town and her medical doctor wanted her to get a stress test and she was not able to order it. Aware will discuss with dr Stanford Breed tomorrow because the EKG is not loaded into the chart where he can see it yet. Pt agreed with this plan.

## 2022-05-03 ENCOUNTER — Encounter: Payer: Self-pay | Admitting: Family Medicine

## 2022-05-03 ENCOUNTER — Telehealth: Payer: Self-pay

## 2022-05-03 ENCOUNTER — Ambulatory Visit (HOSPITAL_BASED_OUTPATIENT_CLINIC_OR_DEPARTMENT_OTHER)
Admission: RE | Admit: 2022-05-03 | Discharge: 2022-05-03 | Disposition: A | Discharge status: Home / Self Care | Payer: Medicare Other | Source: Ambulatory Visit | Attending: Physician Assistant | Admitting: Physician Assistant

## 2022-05-03 ENCOUNTER — Telehealth: Payer: Self-pay | Admitting: Family Medicine

## 2022-05-03 DIAGNOSIS — R072 Precordial pain: Secondary | ICD-10-CM

## 2022-05-03 MED ORDER — IOHEXOL 350 MG/ML SOLN
75.0000 mL | Freq: Once | INTRAVENOUS | Status: AC | PRN
  Administered 2022-05-03: 75 mL via INTRAVENOUS

## 2022-05-03 NOTE — Telephone Encounter (Signed)
Spoke with pt, aware dr Stanford Breed would prefer an in person visit.

## 2022-05-03 NOTE — Telephone Encounter (Signed)
Task completed. Per [employer] - no auth or pre-certification is required for patient with Medicare supplementation. Per insurance guidelines, [employer] pays 80% of the total cost for the imaging. Patient will be responsible to pay 20% of the cost prior to testing.   I have contacted the radiology dept at [facility]. I spoke with Janett Billow and updated her with the correct insurance info and outcome. Per her request, I have also contacted the patient and explained in detail, the outcome of the auth process. Patient wanted to know what her out of pocket cost was. However, I have informed the patient, she will need to contact the imaging dept directly to get clarification of what her OOP cost responsibility could be, since I have no knowledge of Radiology's process regarding payments.   Patient was very upset, crying and is unhappy with her current health insurance plan. She stated that this was the worse insurance [employer]) she has ever encounter, between the change of cost for her medications and paying additional cost for other medical cost. She is frustrated with the insurance coverage. I tried consoling/sympathizing with the patient. I told her that I understand that she has some concerns. Patient thank me for my kindness.   Before ending the call, I emphasize the importance of her moving forward with completing the STAT CT. Patient was agreeable with completing the imaging in lieu of the recent lab results. Patient does have an appointment this afternoon. She mentioned she will contact the radiology department beforehand regarding her concerns of her OOP payment.

## 2022-05-03 NOTE — Telephone Encounter (Signed)
Patient called office to inform that has spoken to [place] office and spoke with someone by the name of Janett Billow, pt states that Janett Billow would need order/authorization for patient to get her CT Scan, Janett Billow can be reached at [phone], thanks!

## 2022-05-03 NOTE — Telephone Encounter (Signed)
Called with stat labs and elevated d-dimer. Consulted PCP of patient and confirmed we could wait and get CTA in the morning.

## 2022-05-03 NOTE — Telephone Encounter (Signed)
So they are trying to get her scheduled however, she is required to pay 20% out of pocket before they will schedule her. [name] from radiology said that Rachael Hancock is calling the billing dept about this.

## 2022-05-03 NOTE — Telephone Encounter (Signed)
[facility] calling with ref# [id number] B urgent lab result for patient.  Called number- was elevated d-dimer Provider already aware of results and treating patient.  Representative informed as above=

## 2022-05-03 NOTE — Progress Notes (Signed)
Hi Seren, great news clot in the lungs.  They did see an old rib fracture which is near the breastbone towards the bottom.  This actually could be causing a little discomfort.  Sometimes old fractures will get a little inflamed and irritated and cause some pain and discomfort.  Right now okay to use an anti-inflammatory such as Aleve or ibuprofen if you can take those things, or Tylenol for pain.  We are still can work on getting you in with cardiology when you get back.

## 2022-05-03 NOTE — Telephone Encounter (Signed)
Can we call [facility] and see if they can do the CTA today - stat for elevated Ddimer, chest pain

## 2022-05-03 NOTE — Progress Notes (Signed)
Chest xray is negative.

## 2022-05-05 ENCOUNTER — Other Ambulatory Visit: Payer: Self-pay | Admitting: Family Medicine

## 2022-05-05 DIAGNOSIS — I10 Essential (primary) hypertension: Secondary | ICD-10-CM

## 2022-05-06 ENCOUNTER — Other Ambulatory Visit: Payer: Medicare Other

## 2022-05-08 MED ORDER — TRAMADOL HCL 50 MG PO TABS: 50.0000 mg | ORAL_TABLET | Freq: Three times a day (TID) | ORAL | 0 refills | Status: DC | PRN

## 2022-05-09 ENCOUNTER — Telehealth: Payer: Medicare Other | Admitting: Physician Assistant

## 2022-05-09 ENCOUNTER — Encounter: Payer: Self-pay | Admitting: Family Medicine

## 2022-05-09 DIAGNOSIS — B9789 Other viral agents as the cause of diseases classified elsewhere: Secondary | ICD-10-CM

## 2022-05-09 NOTE — Progress Notes (Signed)
Patient out of [place] so cannot be treated via e-visit. Resources given.

## 2022-05-15 ENCOUNTER — Other Ambulatory Visit: Payer: Self-pay | Admitting: Family Medicine

## 2022-05-15 DIAGNOSIS — I10 Essential (primary) hypertension: Secondary | ICD-10-CM

## 2022-05-20 ENCOUNTER — Telehealth: Payer: Self-pay | Admitting: Cardiology

## 2022-05-20 NOTE — Telephone Encounter (Signed)
Patient called to schedule appt with Dr. Stanford Breed, but his first available isn't until March at the [facility] office.  Her doctor had referred her see previous phone note.

## 2022-05-20 NOTE — Telephone Encounter (Signed)
Returned call to patient who states that her PCP requested she schedule to see Dr. Stanford Breed as her PCP requests a stress test. Patient denies any symptoms at present. Scheduled patient to see Dr. Stanford Breed 06/04/22 here at [facility]. Patient aware of all appointment time/date/instructions. Advised patient to call back to office with any issues, questions, or concerns. Patient verbalized understanding.   Will forward to MD to make aware.

## 2022-05-21 ENCOUNTER — Encounter: Payer: Self-pay | Admitting: Family Medicine

## 2022-05-21 DIAGNOSIS — I10 Essential (primary) hypertension: Secondary | ICD-10-CM

## 2022-05-21 MED ORDER — LISINOPRIL 40 MG PO TABS: 40.0000 mg | ORAL_TABLET | Freq: Every day | ORAL | 1 refills | Status: DC

## 2022-05-21 NOTE — Telephone Encounter (Signed)
She is supposed to be on valsartan instead of the lisinopril, unless she is wanting to switch back.

## 2022-05-21 NOTE — Telephone Encounter (Signed)
Last office note states to continue Lisinopril, but it is no longer on medication list, can you review?

## 2022-05-27 NOTE — Progress Notes (Signed)
HPI:  FU CP and palpitations. Previous monitors showed sinus with PACs and PVCs. Last echocardiogram April 2021 showed normal LV function, mild left ventricular enlargement, grade 1 diastolic dysfunction. Calcium score April 2021 12 which was 68 percentile.  Lower extremity venous Doppler September 2021 showed no DVT in the left lower extremity.  Seen previously at the dentist office and her heart rate was felt to be irregular.  We did order a monitor at previous office visit but not performed.  CTA November 2023 showed no pulmonary embolus.  Patient seen recently by Dr. Madilyn Fireman with atypical chest pain and felt to need a stress test.  Since last seen, she denies increased dyspnea, palpitations or syncope.  She has had occasional chest pain.  It is in the mid chest area without radiation.  It can last intermittently for 2 days at a time.  Not related to activities.  Current Outpatient Medications  Medication Sig Dispense Refill   AMBULATORY NON FORMULARY MEDICATION oral appliance therapy - mouthpiece for cpap machine. 1 each 0   amLODipine (NORVASC) 10 MG tablet Take 1 tablet (10 mg total) by mouth daily. 90 tablet 0   eletriptan (RELPAX) 20 MG tablet Take 1 tablet (20 mg total) by mouth daily as needed for migraine or headache. May repeat in 2 hours if headache persists or recurs. 10 tablet 5   furosemide (LASIX) 20 MG tablet Take 1 tablet (20 mg total) by mouth daily as needed for edema. 30 tablet 2   hydrocortisone (ANUSOL-HC) 2.5 % rectal cream Place 1 Application rectally 2 (two) times daily. 30 g 11   hydrocortisone (ANUSOL-HC) 25 MG suppository PLACE 1 SUPPOSITORY (25 MG TOTAL) RECTALLY 2 (TWO) TIMES DAILY AS NEEDED FOR HEMORRHOIDS OR ANAL ITCHING. 12 suppository PRN   hydrocortisone cream 1 % Apply 1 application topically 2 (two) times daily as needed for itching.     lisinopril (ZESTRIL) 40 MG tablet Take 1 tablet (40 mg total) by mouth daily. 90 tablet 1   loratadine (CLARITIN) 10 MG  tablet Take 10 mg by mouth daily.     omeprazole (PRILOSEC) 40 MG capsule TAKE 1 CAPSULE BY MOUTH EVERY DAY 90 capsule 3   polyethylene glycol (MIRALAX / GLYCOLAX) 17 g packet Take 17 g by mouth daily.     rosuvastatin (CRESTOR) 20 MG tablet TAKE 1 TABLET BY MOUTH EVERY DAY 90 tablet 3   triamcinolone cream (KENALOG) 0.1 % Apply 1 application topically 2 (two) times daily. Only use for one week at a time. 30 g 0   celecoxib (CELEBREX) 200 MG capsule One to 2 tablets by mouth daily as needed for pain. (Patient not taking: Reported on 06/04/2022) 60 capsule 2   hydrochlorothiazide (HYDRODIURIL) 25 MG tablet Take 1 tablet (25 mg total) by mouth daily. (Patient not taking: Reported on 06/04/2022) 90 tablet 3   magic mouthwash w/lidocaine SOLN Take 15 mLs by mouth 4 (four) times daily as needed for mouth pain. Swish, gargle and spit. (Patient not taking: Reported on 06/04/2022) 500 mL 0   MULTIPLE MINERALS-VITAMINS PO Take 1 tablet by mouth 3 (three) times a week.  (Patient not taking: Reported on 06/04/2022)     No current facility-administered medications for this visit.     Past Medical History:  Diagnosis Date   Anxiety    Arthritis    Complication of anesthesia    HA  WITH SPINAL, WISDOM TO AWHILE TO WAKE UP WHEN YOUNGER-    Depression  GERD (gastroesophageal reflux disease)    Headache    MIGRAINE   Hypertension    Morbid obesity (HCC)    Palpitations    Reflux    Sleep apnea    BORDERLINE -  UNABLE TO USE CPAP 4-5 YRS AGO    Past Surgical History:  Procedure Laterality Date   ANKLE FRACTURE SURGERY     cyst removed     KIDNEY STONE SURGERY     NOVASURE ABLATION     TOTAL SHOULDER ARTHROPLASTY Right 01/08/2018   Procedure: RIGHT TOTAL SHOULDER ARTHROPLASTY;  Surgeon: Tania Ade, MD;  Location: [facility];  Service: Orthopedics;  Laterality: Right;   WISDOM TOOTH EXTRACTION      Social History   Socioeconomic History   Marital status: Married    Spouse name: Tim    Number of children: 2   Years of education: 3 YRS COLL   Highest education level: Not on file  Occupational History   Occupation: CUSTOMER SERVICE REP    Employer: SELF    Comment: [employer]  Tobacco Use   Smoking status: Never   Smokeless tobacco: Never  Vaping Use   Vaping Use: Never used  Substance and Sexual Activity   Alcohol use: Yes    Alcohol/week: 0.0 standard drinks of alcohol    Comment: Rare   Drug use: No   Sexual activity: Not on file  Other Topics Concern   Not on file  Social History Narrative   CUSTOMER SERVICE REP [employer]   3 [place]   1 DAUGHTER   Social Determinants of Health   Financial Resource Strain: Not on file  Food Insecurity: Not on file  Transportation Needs: Not on file  Physical Activity: Not on file  Stress: Not on file  Social Connections: Not on file  Intimate Partner Violence: Not on file    Family History  Problem Relation Age of Onset   Heart attack Father        at age 3   Cancer Paternal Grandmother    Coronary artery disease Other    Arrhythmia Mother        pacemaker insertion   Diabetes Maternal Aunt     ROS: no fevers or chills, productive cough, hemoptysis, dysphasia, odynophagia, melena, hematochezia, dysuria, hematuria, rash, seizure activity, orthopnea, PND, pedal edema, claudication. Remaining systems are negative.  Physical Exam: Well-developed well-nourished in no acute distress.  Skin is warm and dry.  HEENT is normal.  Neck is supple.  Chest is clear to auscultation with normal expansion.  Cardiovascular exam is regular rate and rhythm.  Abdominal exam nontender or distended. No masses palpated. Extremities show no edema. neuro grossly intact  ECG-normal sinus rhythm with PACs, no ST changes.  Personally reviewed  A/P  1 chest pain-symptoms are atypical.  Electrocardiogram shows no ST changes.  I do not think she requires further cardiac testing at  this point.  2 history of mildly elevated calcium score-continue statin.  3 hypertension-patient's blood pressure is elevated.  However it is controlled at home.  Will continue present medications and follow-up.  4 palpitations-we will consider addition of a beta-blocker as needed in the future.  At present her symptoms are reasonable.  5 morbid obesity-we again discussed the importance of exercise and weight loss.  Kirk Ruths, MD

## 2022-05-28 ENCOUNTER — Encounter: Payer: Self-pay | Admitting: Cardiology

## 2022-05-29 ENCOUNTER — Encounter: Payer: Self-pay | Admitting: Family Medicine

## 2022-05-29 ENCOUNTER — Telehealth: Payer: Self-pay | Admitting: Cardiology

## 2022-05-29 DIAGNOSIS — I10 Essential (primary) hypertension: Secondary | ICD-10-CM

## 2022-05-29 MED ORDER — AMLODIPINE BESYLATE 10 MG PO TABS: 10.0000 mg | ORAL_TABLET | Freq: Every day | ORAL | 0 refills | Status: DC

## 2022-05-29 NOTE — Telephone Encounter (Signed)
*  STAT* If patient is at the pharmacy, call can be transferred to refill team.   1. Which medications need to be refilled? (please list name of each medication and dose if known)   amLODipine (NORVASC) 10 MG tablet    2. Which pharmacy/location (including street and city if local pharmacy) is medication to be sent to? [place], [place] AT [address] [place]   3. Do they need a 30 day or 90 day supply? 90  Pt is completely out

## 2022-06-04 ENCOUNTER — Encounter: Payer: Self-pay | Admitting: Cardiology

## 2022-06-04 ENCOUNTER — Ambulatory Visit: Payer: Medicare Other | Attending: Cardiology | Admitting: Cardiology

## 2022-06-04 VITALS — BP 158/62 | HR 102 | Ht 67.0 in | Wt 335.8 lb

## 2022-06-04 DIAGNOSIS — I10 Essential (primary) hypertension: Secondary | ICD-10-CM | POA: Insufficient documentation

## 2022-06-04 DIAGNOSIS — R002 Palpitations: Secondary | ICD-10-CM | POA: Insufficient documentation

## 2022-06-04 DIAGNOSIS — R072 Precordial pain: Secondary | ICD-10-CM | POA: Insufficient documentation

## 2022-06-04 DIAGNOSIS — R931 Abnormal findings on diagnostic imaging of heart and coronary circulation: Secondary | ICD-10-CM | POA: Insufficient documentation

## 2022-06-04 NOTE — Patient Instructions (Signed)
    Follow-Up: At [facility], you and your health needs are our priority.  As part of our continuing mission to provide you with exceptional heart care, we have created designated Provider Care Teams.  These Care Teams include your primary Cardiologist (physician) and Advanced Practice Providers (APPs -  Physician Assistants and Nurse Practitioners) who all work together to provide you with the care you need, when you need it.  We recommend signing up for the patient portal called "MyChart".  Sign up information is provided on this After Visit Summary.  MyChart is used to connect with patients for Virtual Visits (Telemedicine).  Patients are able to view lab/test results, encounter notes, upcoming appointments, etc.  Non-urgent messages can be sent to your provider as well.   To learn more about what you can do with MyChart, go to [email or web address].    Your next appointment:   12 month(s)  The format for your next appointment:   In Person  Provider:   Brian Crenshaw MD          

## 2022-06-07 ENCOUNTER — Ambulatory Visit: Payer: Medicare Other | Admitting: Sports Medicine

## 2022-06-19 ENCOUNTER — Encounter: Payer: Self-pay | Admitting: Family Medicine

## 2022-06-19 ENCOUNTER — Ambulatory Visit (INDEPENDENT_AMBULATORY_CARE_PROVIDER_SITE_OTHER): Payer: Medicare Other | Admitting: Family Medicine

## 2022-06-19 VITALS — BP 132/76 | HR 89 | Temp 97.9°F | Ht 68.0 in | Wt 339.0 lb

## 2022-06-19 DIAGNOSIS — T3695XA Adverse effect of unspecified systemic antibiotic, initial encounter: Secondary | ICD-10-CM | POA: Diagnosis not present

## 2022-06-19 DIAGNOSIS — J0100 Acute maxillary sinusitis, unspecified: Secondary | ICD-10-CM | POA: Insufficient documentation

## 2022-06-19 DIAGNOSIS — B379 Candidiasis, unspecified: Secondary | ICD-10-CM | POA: Diagnosis not present

## 2022-06-19 MED ORDER — FLUCONAZOLE 150 MG PO TABS: 150.0000 mg | ORAL_TABLET | Freq: Once | ORAL | 0 refills | Status: AC

## 2022-06-19 MED ORDER — FLUTICASONE PROPIONATE 50 MCG/ACT NA SUSP: 2.0000 | Freq: Every day | NASAL | 6 refills | Status: DC

## 2022-06-19 MED ORDER — AMOXICILLIN-POT CLAVULANATE 875-125 MG PO TABS: 1.0000 | ORAL_TABLET | Freq: Two times a day (BID) | ORAL | 0 refills | Status: DC

## 2022-06-19 NOTE — Progress Notes (Signed)
Acute Office Visit  Subjective:     Patient ID: Sheenah Dimitroff, female    DOB: [birth year], 66 y.o.   MRN: [id number]  Chief Complaint  Patient presents with   Acute Visit    Patient in office head / nasal congestion, headache, pain behind eyes, occasional cough producing cloudy thick milky mucus,  and malaise, x  Friday 06/14/22-     HPI This is a new problem. Presents today for an acute visit with complaint of nasal congestion and head congestion, headache over both eyes and behind eyes, fatigue due to not sleeping well at  night. Sleeping in recliner.  Symptoms have been present for 4 days Associated symptoms include: no fever or chills.  Treatments tried include : Claritin and lots of fluids Treatment effective : not effective Sick contacts : daughter    Review of Systems  Constitutional:  Negative for chills and fever.  HENT:  Positive for congestion and sinus pain.   Respiratory:  Positive for cough. Negative for shortness of breath.   Gastrointestinal:  Negative for nausea and vomiting.        Objective:    BP 132/76   Pulse 89   Temp 97.9 F (36.6 C)   Ht '5\' 8"'$  (1.727 m)   Wt (!) 339 lb (153.8 kg)   SpO2 98%   BMI 51.54 kg/m    Physical Exam Vitals and nursing note reviewed.  Constitutional:      General: She is not in acute distress.    Appearance: Normal appearance.  HENT:     Right Ear: Tympanic membrane normal.     Left Ear: Tympanic membrane normal.     Nose:     Right Turbinates: Swollen.     Left Turbinates: Swollen.     Right Sinus: Maxillary sinus tenderness and frontal sinus tenderness present.     Left Sinus: Maxillary sinus tenderness and frontal sinus tenderness present.     Mouth/Throat:     Pharynx: Uvula midline. No oropharyngeal exudate or posterior oropharyngeal erythema.  Neurological:     Mental Status: She is alert.     No results found for any visits on 06/19/22.      Assessment & Plan:   Problem List Items  Addressed This Visit     Acute non-recurrent maxillary sinusitis - Primary   Relevant Medications   amoxicillin-clavulanate (AUGMENTIN) 875-125 MG tablet   fluconazole (DIFLUCAN) 150 MG tablet   fluticasone (FLONASE) 50 MCG/ACT nasal spray   Antibiotic-induced yeast infection   Relevant Medications   fluconazole (DIFLUCAN) 150 MG tablet  Maxillary and frontal sinus tenderness upon palpation.  Lungs clear, no shortness of breath, oxygen saturation 98%. CrCl: 69 Augmentin as instructed Diflucan if needed for antibiotic induced yeast infection. Flonase for nasal congestion as instructed. Recommend sinus rinses.  Increase fluids, supportive therapy. Caution given on using decongestants, due to chances of increasing blood pressure. Agrees with plan discussed today. Questions answered.  Meds ordered this encounter  Medications   amoxicillin-clavulanate (AUGMENTIN) 875-125 MG tablet    Sig: Take 1 tablet by mouth 2 (two) times daily.    Dispense:  20 tablet    Refill:  0    Order Specific Question:   Supervising Provider    Answer:   Leeanne Rio [[id number]]   fluconazole (DIFLUCAN) 150 MG tablet    Sig: Take 1 tablet (150 mg total) by mouth once for 1 dose.    Dispense:  1 tablet  Refill:  0    Order Specific Question:   Supervising Provider    Answer:   Leeanne Rio [[id number]]   fluticasone (FLONASE) 50 MCG/ACT nasal spray    Sig: Place 2 sprays into both nostrils daily.    Dispense:  16 g    Refill:  6    Order Specific Question:   Supervising Provider    Answer:   Leeanne Rio [[id number]]    Return if symptoms worsen or fail to improve.  Chalmers Guest, FNP

## 2022-07-01 ENCOUNTER — Ambulatory Visit (INDEPENDENT_AMBULATORY_CARE_PROVIDER_SITE_OTHER): Payer: Medicare Other | Admitting: Family Medicine

## 2022-07-01 VITALS — BP 146/76 | HR 90 | Temp 97.9°F | Ht 68.0 in | Wt 335.0 lb

## 2022-07-01 DIAGNOSIS — J019 Acute sinusitis, unspecified: Secondary | ICD-10-CM | POA: Diagnosis not present

## 2022-07-01 MED ORDER — DOXYCYCLINE HYCLATE 100 MG PO TABS: 100.0000 mg | ORAL_TABLET | Freq: Two times a day (BID) | ORAL | 0 refills | Status: DC

## 2022-07-01 NOTE — Progress Notes (Signed)
Acute Office Visit  Subjective:     Patient ID: Rachael Hancock, female    DOB: [birth year], 67 y.o.   MRN: [id number]  Chief Complaint  Patient presents with   Sinusitis    HPI Patient is in today for continued sinus congestion.  She says at the end of November she actually had the flu and had a lot of nasal congestion and but then started feeling better.  She started getting nasal congestion again right around Christmas she was seen for acute sinusitis and started on Augmentin and fluticasone.  She still has a couple tabs left on the Augmentin.  She said she started to feel a little better until Friday when she started to feel worse again.  No fevers or chills.  She is still using the Flonase.  She is got some throbbing in her ear.  She has a lot of tightness and pressure behind her nose and eyes.  ROS      Objective:    BP (!) 146/76   Pulse 90   Temp 97.9 F (36.6 C) (Oral)   Ht '5\' 8"'$  (1.727 m)   Wt (!) 335 lb (152 kg)   SpO2 96%   BMI 50.94 kg/m    Physical Exam Constitutional:      Appearance: She is well-developed.  HENT:     Head: Normocephalic and atraumatic.     Right Ear: External ear normal.     Left Ear: External ear normal.     Nose: Nose normal.  Eyes:     Conjunctiva/sclera: Conjunctivae normal.     Pupils: Pupils are equal, round, and reactive to light.  Neck:     Thyroid: No thyromegaly.  Cardiovascular:     Rate and Rhythm: Normal rate and regular rhythm.     Heart sounds: Normal heart sounds.  Pulmonary:     Effort: Pulmonary effort is normal.     Breath sounds: Normal breath sounds. No wheezing.  Musculoskeletal:     Cervical back: Neck supple.  Lymphadenopathy:     Cervical: No cervical adenopathy.  Skin:    General: Skin is warm and dry.  Neurological:     Mental Status: She is alert and oriented to person, place, and time.     No results found for any visits on 07/01/22.      Assessment & Plan:   Problem List Items  Addressed This Visit   None Visit Diagnoses     Acute sinusitis, recurrence not specified, unspecified location    -  Primary   Relevant Medications   doxycycline (VIBRA-TABS) 100 MG tablet      recurrent sinusitis-we will switch to doxycycline.  Continue with fluticasone nasal spray.  Also recommend using nasal saline to try to irrigate the sinuses.  If not feeling better by the end of the week then please let me know.  We discussed that it could be that she has a second viral illness on top of the one that she had or that the initial 1 was more viral and now it is turning into a bacterial infection.  Versus it may truly be resistant.  If not improving consider ENT referral.  Meds ordered this encounter  Medications   doxycycline (VIBRA-TABS) 100 MG tablet    Sig: Take 1 tablet (100 mg total) by mouth 2 (two) times daily.    Dispense:  20 tablet    Refill:  0    No follow-ups on file.  Beatrice Lecher,  MD   

## 2022-07-08 ENCOUNTER — Encounter: Payer: Self-pay | Admitting: Cardiology

## 2022-07-11 ENCOUNTER — Encounter: Payer: Self-pay | Admitting: Family Medicine

## 2022-07-17 MED ORDER — FLUCONAZOLE 150 MG PO TABS: 150.0000 mg | ORAL_TABLET | Freq: Once | ORAL | 0 refills | Status: AC

## 2022-07-17 MED ORDER — TRIAMCINOLONE ACETONIDE 0.5 % EX CREA: 1.0000 | TOPICAL_CREAM | Freq: Two times a day (BID) | CUTANEOUS | 2 refills | Status: DC | PRN

## 2022-07-17 NOTE — Addendum Note (Signed)
Addended by: Beatrice Lecher D on: 07/17/2022 02:40 PM   Modules accepted: Orders

## 2022-07-18 ENCOUNTER — Ambulatory Visit (INDEPENDENT_AMBULATORY_CARE_PROVIDER_SITE_OTHER): Payer: Medicare Other | Admitting: Family Medicine

## 2022-07-18 ENCOUNTER — Encounter: Payer: Self-pay | Admitting: Family Medicine

## 2022-07-18 VITALS — BP 134/64 | HR 91 | Temp 98.3°F | Ht 68.0 in | Wt 331.0 lb

## 2022-07-18 DIAGNOSIS — J020 Streptococcal pharyngitis: Secondary | ICD-10-CM | POA: Diagnosis not present

## 2022-07-18 LAB — POCT RAPID STREP A (OFFICE): Rapid Strep A Screen: POSITIVE — AB

## 2022-07-18 MED ORDER — PENICILLIN G BENZATHINE 1200000 UNIT/2ML IM SUSY
1.2000 10*6.[IU] | PREFILLED_SYRINGE | Freq: Once | INTRAMUSCULAR | Status: AC
  Administered 2022-07-18: 1.2 10*6.[IU] via INTRAMUSCULAR

## 2022-07-18 NOTE — Progress Notes (Signed)
   Acute Office Visit  Subjective:     Patient ID: Rachael Hancock, female    DOB: 01-14-1956, 67 y.o.   MRN: [id number]  Chief Complaint  Patient presents with   Sore Throat    Congestion for 3 days and slight headaches    HPI Patient is in today for left ear pain x 3 days. + HA.  She has not had fever or chills.  She says she is never had a sore throat this long.  She was just treated for sinusitis about 2 weeks ago and says she feels like she got better from that.  She still has a little bit of congestion but is not as bad as it was..  ROS      Objective:    BP 134/64 (BP Location: Left Arm, Patient Position: Sitting, Cuff Size: Large)   Pulse 91   Temp 98.3 F (36.8 C) (Temporal)   Ht '5\' 8"'$  (1.727 m)   Wt (!) 331 lb 0.6 oz (150.2 kg)   SpO2 95%   BMI 50.33 kg/m    Physical Exam Constitutional:      Appearance: She is well-developed.  HENT:     Head: Normocephalic and atraumatic.     Right Ear: Tympanic membrane, ear canal and external ear normal.     Left Ear: Tympanic membrane, ear canal and external ear normal.     Nose: Nose normal.     Mouth/Throat:     Mouth: Mucous membranes are moist.     Pharynx: Posterior oropharyngeal erythema present.     Tonsils: No tonsillar exudate.  Eyes:     Conjunctiva/sclera: Conjunctivae normal.     Pupils: Pupils are equal, round, and reactive to light.  Neck:     Thyroid: No thyromegaly.  Cardiovascular:     Rate and Rhythm: Normal rate and regular rhythm.     Heart sounds: Normal heart sounds.  Pulmonary:     Effort: Pulmonary effort is normal.     Breath sounds: Normal breath sounds. No wheezing.  Musculoskeletal:     Cervical back: Neck supple.  Lymphadenopathy:     Cervical: No cervical adenopathy.  Skin:    General: Skin is warm and dry.  Neurological:     Mental Status: She is alert and oriented to person, place, and time.     Results for orders placed or performed in visit on 07/18/22  POCT rapid  strep A  Result Value Ref Range   Rapid Strep A Screen Positive (A) Negative        Assessment & Plan:   Problem List Items Addressed This Visit   None Visit Diagnoses     Strep throat    -  Primary   Relevant Medications   penicillin g benzathine (BICILLIN LA) 1200000 UNIT/2ML injection 1.2 Million Units (Completed)   Other Relevant Orders   POCT rapid strep A (Completed)      Strep throat-discussed treatment options.  Patient prefers IM PCN. Discussed changing toothbrush, etc.  Call if not nbetter in one week.   Meds ordered this encounter  Medications   penicillin g benzathine (BICILLIN LA) 1200000 UNIT/2ML injection 1.2 Million Units    Order Specific Question:   Antibiotic Indication:    Answer:   Pharyngitis    No follow-ups on file.  Beatrice Lecher, MD

## 2022-08-15 ENCOUNTER — Ambulatory Visit (INDEPENDENT_AMBULATORY_CARE_PROVIDER_SITE_OTHER): Payer: Medicare Other | Admitting: Family Medicine

## 2022-08-15 ENCOUNTER — Encounter: Payer: Self-pay | Admitting: Family Medicine

## 2022-08-15 ENCOUNTER — Ambulatory Visit (INDEPENDENT_AMBULATORY_CARE_PROVIDER_SITE_OTHER): Payer: Medicare Other

## 2022-08-15 VITALS — BP 137/63 | HR 91 | Ht 68.0 in | Wt 336.0 lb

## 2022-08-15 DIAGNOSIS — R1031 Right lower quadrant pain: Secondary | ICD-10-CM | POA: Diagnosis not present

## 2022-08-15 NOTE — Progress Notes (Signed)
   Acute Office Visit  Subjective:     Patient ID: Rachael Hancock, female    DOB: 09/21/1955, 67 y.o.   MRN: [id number]  Chief Complaint  Patient presents with   right sided pain    HPI Patient is in today for right-sided abdominal pain.  She says that over the last couple of weeks she had a couple episodes of right lower quadrant pain but then would have a bowel movement and get relief.  But in the last 3 days it has been much more persistent.  She rates her pain a 5 out of 10.  She even tried taking some magnesium citrate but this time did not get relief with the pain.  She says she does struggle some with constipation but her bowels have been moving in fact recently she has been trying to bump up her fiber intake and her water intake to help with her hemorrhoids.  No fevers chills or sweats.  No bloody stools.  No nausea or vomiting or GERD.  ROS      Objective:    BP 137/63   Pulse 91   Ht 5' 8"$  (1.727 m)   Wt (!) 336 lb (152.4 kg)   SpO2 97%   BMI 51.09 kg/m    Physical Exam Vitals and nursing note reviewed.  Constitutional:      Appearance: She is well-developed.  HENT:     Head: Normocephalic and atraumatic.  Cardiovascular:     Rate and Rhythm: Normal rate and regular rhythm.     Heart sounds: Normal heart sounds.  Pulmonary:     Effort: Pulmonary effort is normal.     Breath sounds: Normal breath sounds.  Abdominal:     General: Bowel sounds are normal.     Palpations: Abdomen is soft.     Tenderness: There is abdominal tenderness.     Comments: TTP in the right lower quadrant, suprapubic area and towards the mid abdomen.  No rebound or guarding.  Skin:    General: Skin is warm and dry.  Neurological:     Mental Status: She is alert and oriented to person, place, and time.  Psychiatric:        Behavior: Behavior normal.     No results found for any visits on 08/15/22.      Assessment & Plan:   Problem List Items Addressed This Visit    None Visit Diagnoses     RLQ abdominal pain    -  Primary   Relevant Orders   CBC with Differential/Platelet   COMPLETE METABOLIC PANEL WITH GFR   DG Abd 1 View      RLQ pain -we discussed differential diagnosis including constipation versus appendicitis, versus ovarian issue though she is postmenopausal and has not had a period in a most 10 years.  Will start with a KUB today to see if maybe there is some excess stool present also check a CBC with differential to evaluate for elevated white blood count which might indicate more acute infection.  If she is not improving or gets worse then please let us know or go to the emergency department if more severe.  Consider CT of abdomen pelvis for further workup if not improving.  No orders of the defined types were placed in this encounter.   No follow-ups on file.  Beatrice Lecher, MD

## 2022-08-16 LAB — TIQ- MISLABELED: Test Ordered On Req: 10231

## 2022-08-16 NOTE — Progress Notes (Signed)
Pls call lab, I think it is the CMP missing

## 2022-08-16 NOTE — Progress Notes (Signed)
Hi Rachael Hancock, your blood count is normal which is really reassuring.

## 2022-08-18 LAB — CBC WITH DIFFERENTIAL/PLATELET
Absolute Monocytes: 503 cells/uL (ref 200–950)
Basophils Absolute: 67 cells/uL (ref 0–200)
Basophils Relative: 1 %
Eosinophils Absolute: 161 cells/uL (ref 15–500)
Eosinophils Relative: 2.4 %
HCT: 41.2 % (ref 35.0–45.0)
Hemoglobin: 13.7 g/dL (ref 11.7–15.5)
Lymphs Abs: 2211 cells/uL (ref 850–3900)
MCH: 29.5 pg (ref 27.0–33.0)
MCHC: 33.3 g/dL (ref 32.0–36.0)
MCV: 88.8 fL (ref 80.0–100.0)
MPV: 10.9 fL (ref 7.5–12.5)
Monocytes Relative: 7.5 %
Neutro Abs: 3759 cells/uL (ref 1500–7800)
Neutrophils Relative %: 56.1 %
Platelets: 274 10*3/uL (ref 140–400)
RBC: 4.64 10*6/uL (ref 3.80–5.10)
RDW: 13 % (ref 11.0–15.0)
Total Lymphocyte: 33 %
WBC: 6.7 10*3/uL (ref 3.8–10.8)

## 2022-08-18 LAB — COMPLETE METABOLIC PANEL WITH GFR
AG Ratio: 1.4 (calc) (ref 1.0–2.5)
ALT: 14 U/L (ref 6–29)
AST: 16 U/L (ref 10–35)
Albumin: 4.4 g/dL (ref 3.6–5.1)
Alkaline phosphatase (APISO): 81 U/L (ref 37–153)
BUN/Creatinine Ratio: 13 (calc) (ref 6–22)
BUN: 15 mg/dL (ref 7–25)
CO2: 22 mmol/L (ref 20–32)
Calcium: 9.7 mg/dL (ref 8.6–10.4)
Chloride: 105 mmol/L (ref 98–110)
Creat: 1.12 mg/dL — ABNORMAL HIGH (ref 0.50–1.05)
Globulin: 3.1 g/dL (calc) (ref 1.9–3.7)
Glucose, Bld: 95 mg/dL (ref 65–99)
Potassium: 3.9 mmol/L (ref 3.5–5.3)
Sodium: 144 mmol/L (ref 135–146)
Total Bilirubin: 0.7 mg/dL (ref 0.2–1.2)
Total Protein: 7.5 g/dL (ref 6.1–8.1)
eGFR: 55 mL/min/{1.73_m2} — ABNORMAL LOW (ref >=60)

## 2022-08-18 LAB — PAT ID TIQ DOC: Test Affected: 10231

## 2022-08-19 NOTE — Progress Notes (Signed)
Xray looks ok. How are you feeling today?

## 2022-08-19 NOTE — Progress Notes (Signed)
HI Rachael Hancock,  Kidney function is stable.  In fact it actually looks better than it did 3 months ago.  Electrolytes, liver function and blood count are normal as well no sign of acute infection.

## 2022-08-21 LAB — HM MAMMOGRAPHY

## 2022-08-22 ENCOUNTER — Encounter: Payer: Self-pay | Admitting: Family Medicine

## 2022-08-23 ENCOUNTER — Telehealth: Payer: Self-pay | Admitting: Cardiology

## 2022-08-23 NOTE — Telephone Encounter (Signed)
*  STAT* If patient is at the pharmacy, call can be transferred to refill team.   1. Which medications need to be refilled? (please list name of each medication and dose if known) amLODipine (NORVASC) 10 MG tablet   2. Which pharmacy/location (including street and city if local pharmacy) is medication to be sent to? [place], [address]   3. Do they need a 30 day or 90 day supply? 90 day

## 2022-08-26 ENCOUNTER — Other Ambulatory Visit: Payer: Self-pay | Admitting: Cardiology

## 2022-08-26 DIAGNOSIS — I10 Essential (primary) hypertension: Secondary | ICD-10-CM

## 2022-08-28 ENCOUNTER — Encounter: Payer: Self-pay | Admitting: Family Medicine

## 2022-08-28 ENCOUNTER — Telehealth: Payer: Self-pay | Admitting: Cardiology

## 2022-08-28 DIAGNOSIS — I10 Essential (primary) hypertension: Secondary | ICD-10-CM

## 2022-08-28 DIAGNOSIS — R600 Localized edema: Secondary | ICD-10-CM

## 2022-08-28 DIAGNOSIS — R931 Abnormal findings on diagnostic imaging of heart and coronary circulation: Secondary | ICD-10-CM

## 2022-08-28 MED ORDER — FUROSEMIDE 20 MG PO TABS: 20.0000 mg | ORAL_TABLET | Freq: Every day | ORAL | 2 refills | Status: DC | PRN

## 2022-08-28 MED ORDER — AMLODIPINE BESYLATE 10 MG PO TABS: 10.0000 mg | ORAL_TABLET | Freq: Every day | ORAL | 2 refills | Status: DC

## 2022-08-28 MED ORDER — ROSUVASTATIN CALCIUM 20 MG PO TABS: 20.0000 mg | ORAL_TABLET | Freq: Every day | ORAL | 2 refills | Status: DC

## 2022-08-28 MED ORDER — LISINOPRIL 40 MG PO TABS: 40.0000 mg | ORAL_TABLET | Freq: Every day | ORAL | 3 refills | Status: DC

## 2022-08-28 NOTE — Telephone Encounter (Signed)
Pt c/o medication issue:  1. Name of Medication: amLODipine (NORVASC) 10 MG tablet    2. How are you currently taking this medication (dosage and times per day)? Take 1 tablet (10 mg total) by mouth daily.  3. Are you having a reaction (difficulty breathing--STAT)? No   4. What is your medication issue? Pt requested a refill on 08/23/22 at  [place], [place] and the pt said that [facility] never received the request for the prescription. The pt stated that they only have one tablet left. It looks like the Amlodipine was taken off of the pt's medication list, but pt states they are still taking this medication.. Pt then requested for a refill on their rosuvastatin    *STAT* If patient is at the pharmacy, call can be transferred to refill team.   1. Which medications need to be refilled? (please list name of each medication and dose if known) rosuvastatin (CRESTOR) 20 MG tablet TAKE 1 TABLET BY MOUTH EVERY DAY   2. Which pharmacy/location (including street and city if local pharmacy) is medication to be sent to?[place], [address]    3. Do they need a 30 day or 90 day supply? 90 day

## 2022-08-28 NOTE — Telephone Encounter (Signed)
Ok to refill amlodipine Rachael Hancock   'Refill sent to patient preferred pharmacy

## 2022-08-28 NOTE — Telephone Encounter (Signed)
Received call from patient who states that she needs refills on her Amlodipine '10mg'$  daily and rosuvastatin '20mg'$  (confirmed with patient that she has been taking these daily). Per chart review- amlodipine was removed (expired)  Advised patient I refilled crestor '20mg'$  daily and will confirm with Dr. Stanford Breed if okay to add back amlodipine '10mg'$  daily.

## 2022-08-30 ENCOUNTER — Ambulatory Visit (INDEPENDENT_AMBULATORY_CARE_PROVIDER_SITE_OTHER): Payer: Medicare Other

## 2022-08-30 ENCOUNTER — Ambulatory Visit (INDEPENDENT_AMBULATORY_CARE_PROVIDER_SITE_OTHER): Payer: Medicare Other | Admitting: Sports Medicine

## 2022-08-30 DIAGNOSIS — M19012 Primary osteoarthritis, left shoulder: Secondary | ICD-10-CM | POA: Diagnosis not present

## 2022-08-30 DIAGNOSIS — M160 Bilateral primary osteoarthritis of hip: Secondary | ICD-10-CM | POA: Diagnosis not present

## 2022-08-30 DIAGNOSIS — M1811 Unilateral primary osteoarthritis of first carpometacarpal joint, right hand: Secondary | ICD-10-CM

## 2022-08-30 MED ORDER — TRAMADOL HCL 50 MG PO TABS: 50.0000 mg | ORAL_TABLET | Freq: Three times a day (TID) | ORAL | 0 refills | Status: DC | PRN

## 2022-08-30 MED ORDER — DICLOFENAC SODIUM 75 MG PO TBEC: 75.0000 mg | DELAYED_RELEASE_TABLET | Freq: Two times a day (BID) | ORAL | 3 refills | Status: DC

## 2022-08-30 NOTE — Assessment & Plan Note (Signed)
Pleasant 67 year old female status post right shoulder arthroplasty with Dr. Tamera Punt, increasing pain in the left shoulder, glenohumeral signs on exam. I did review a CT of her chest from prior that does show osteoarthritis in the left shoulder with cystic changes. Switching from ibuprofen to Voltaren, tramadol for breakthrough pain, x-rays and home physical therapy, return to see me in 6 weeks, injection if not better.

## 2022-08-30 NOTE — Assessment & Plan Note (Signed)
Right first CMC osteoarthritis, x-rays, Voltaren, home conditioning return in 6 weeks, injection if not better.

## 2022-08-30 NOTE — Progress Notes (Signed)
    Procedures performed today:    None.  Independent interpretation of notes and tests performed by another provider:   None.  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 Tamera Punt, increasing pain in the left shoulder, glenohumeral signs on exam. I did review a CT of her chest from prior that does show osteoarthritis in the left shoulder with cystic changes. Switching from ibuprofen to Voltaren, tramadol for breakthrough pain, x-rays and home physical therapy, return to see me in 6 weeks, injection if not better.  Primary osteoarthritis of both hips Bailey Mech also has severe hip osteoarthritis bilaterally, pain with internal rotation bilaterally, adding hip exercises, updated x-rays, Voltaren and tramadol, if no better in 6 weeks we will do bilateral hip joint injections.  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    ____________________________________________ Gwen Her. Dianah Field, M.D., ABFM., CAQSM., AME. Primary Care and Sports Medicine [facility]  Adjunct Professor of [employer]  Risk manager

## 2022-08-30 NOTE — Assessment & Plan Note (Signed)
Rachael Hancock also has severe hip osteoarthritis bilaterally, pain with internal rotation bilaterally, adding hip exercises, updated x-rays, Voltaren and tramadol, if no better in 6 weeks we will do bilateral hip joint injections.

## 2022-09-11 ENCOUNTER — Encounter: Payer: Self-pay | Admitting: Sports Medicine

## 2022-09-15 ENCOUNTER — Other Ambulatory Visit: Payer: Self-pay | Admitting: Sports Medicine

## 2022-09-15 DIAGNOSIS — M19012 Primary osteoarthritis, left shoulder: Secondary | ICD-10-CM

## 2022-10-03 ENCOUNTER — Other Ambulatory Visit: Payer: Self-pay | Admitting: Sports Medicine

## 2022-10-03 DIAGNOSIS — M19012 Primary osteoarthritis, left shoulder: Secondary | ICD-10-CM

## 2022-10-04 ENCOUNTER — Telehealth: Payer: Self-pay

## 2022-10-04 NOTE — Telephone Encounter (Signed)
Pharmacy called asking question about pt latest script on on pain medication they are requesting a call back

## 2022-10-10 NOTE — Telephone Encounter (Signed)
Dr. Benjamin Stain sent this in for her.

## 2022-10-11 ENCOUNTER — Ambulatory Visit (INDEPENDENT_AMBULATORY_CARE_PROVIDER_SITE_OTHER): Payer: Medicare Other | Admitting: Sports Medicine

## 2022-10-11 ENCOUNTER — Other Ambulatory Visit (INDEPENDENT_AMBULATORY_CARE_PROVIDER_SITE_OTHER): Payer: Medicare Other

## 2022-10-11 DIAGNOSIS — M160 Bilateral primary osteoarthritis of hip: Secondary | ICD-10-CM

## 2022-10-11 DIAGNOSIS — M19012 Primary osteoarthritis, left shoulder: Secondary | ICD-10-CM | POA: Diagnosis not present

## 2022-10-11 DIAGNOSIS — M1811 Unilateral primary osteoarthritis of first carpometacarpal joint, right hand: Secondary | ICD-10-CM | POA: Diagnosis not present

## 2022-10-11 NOTE — Assessment & Plan Note (Signed)
Severe hip osteoarthritis on x-rays, increasing pain right hip, unable to tolerate therapy. Today we injected her right hip, return to see me in 6 weeks.

## 2022-10-11 NOTE — Progress Notes (Signed)
    Procedures performed today:    Procedure: Real-time Ultrasound Guided injection of the right hip joint Device: Samsung HS60  Verbal informed consent obtained.  Time-out conducted.  Noted no overlying erythema, induration, or other signs of local infection.  Skin prepped in a sterile fashion.  Local anesthesia: Topical Ethyl chloride.  With sterile technique and under real time ultrasound guidance: Arthritic joint noted, 1 cc Kenalog 40, 2 cc lidocaine, 2 cc bupivacaine injected easily Completed without difficulty  Advised to call if fevers/chills, erythema, induration, drainage, or persistent bleeding.  Images permanently stored and available for review in PACS.  Impression: Technically successful ultrasound guided injection.  Independent interpretation of notes and tests performed by another provider:   None.  Brief History, Exam, Impression, and Recommendations:    Primary osteoarthritis of both hips Severe hip osteoarthritis on x-rays, increasing pain right hip, unable to tolerate therapy. Today we injected her right hip, return to see me in 6 weeks.  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 Ave Filter, left shoulder does have osteoarthritis based on his CT chest from prior, Voltaren, tramadol and home physical therapy seems to have helped her symptoms significantly, if she does have a recurrence we will consider glenohumeral joint injection.  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    ____________________________________________ Ihor Austin. Benjamin Stain, M.D., ABFM., CAQSM., AME. Primary Care and Sports Medicine [facility]  Adjunct Professor of Family Medicine  [employer] [facility] [employer]  Restaurant manager, fast food

## 2022-10-11 NOTE — Assessment & Plan Note (Signed)
Right first CMC osteoarthritis, doing better with Voltaren and home PT, injection in the future if not better or worsens, return as needed for this.

## 2022-10-11 NOTE — Assessment & Plan Note (Signed)
History of right shoulder arthroplasty with Dr. Ave Filter, left shoulder does have osteoarthritis based on his CT chest from prior, Voltaren, tramadol and home physical therapy seems to have helped her symptoms significantly, if she does have a recurrence we will consider glenohumeral joint injection.

## 2022-10-24 ENCOUNTER — Other Ambulatory Visit: Payer: Self-pay | Admitting: Sports Medicine

## 2022-10-24 DIAGNOSIS — M19012 Primary osteoarthritis, left shoulder: Secondary | ICD-10-CM

## 2022-11-05 ENCOUNTER — Telehealth: Payer: Self-pay | Admitting: Family Medicine

## 2022-11-05 NOTE — Telephone Encounter (Signed)
Called patient to schedule Medicare Annual Wellness Visit (AWV). Left message for patient to call back and schedule Medicare Annual Wellness Visit (AWV).  Last date of AWV: Never  Please schedule an appointment at any time with PCP for welcome to Medicare.  If any questions, please contact me at [phone].  Thank you ,  [name]

## 2022-11-26 ENCOUNTER — Ambulatory Visit: Payer: Medicare Other | Admitting: Sports Medicine

## 2022-12-13 ENCOUNTER — Encounter: Payer: Self-pay | Admitting: Physician Assistant

## 2022-12-13 ENCOUNTER — Ambulatory Visit (INDEPENDENT_AMBULATORY_CARE_PROVIDER_SITE_OTHER): Payer: Medicare Other | Admitting: Physician Assistant

## 2022-12-13 VITALS — BP 138/66 | HR 80 | Ht 68.0 in | Wt 320.0 lb

## 2022-12-13 DIAGNOSIS — R300 Dysuria: Secondary | ICD-10-CM | POA: Diagnosis not present

## 2022-12-13 DIAGNOSIS — N898 Other specified noninflammatory disorders of vagina: Secondary | ICD-10-CM

## 2022-12-13 DIAGNOSIS — L290 Pruritus ani: Secondary | ICD-10-CM | POA: Diagnosis not present

## 2022-12-13 DIAGNOSIS — K649 Unspecified hemorrhoids: Secondary | ICD-10-CM

## 2022-12-13 LAB — POCT URINALYSIS DIP (CLINITEK)
Blood, UA: NEGATIVE
Glucose, UA: NEGATIVE mg/dL
Ketones, POC UA: NEGATIVE mg/dL
Leukocytes, UA: NEGATIVE
Nitrite, UA: NEGATIVE
POC PROTEIN,UA: 30 — AB
Spec Grav, UA: >=1.03 — AB (ref 1.010–1.025)
Urobilinogen, UA: 0.2 E.U./dL
pH, UA: 5.5 (ref 5.0–8.0)

## 2022-12-13 LAB — WET PREP FOR TRICH, YEAST, CLUE
MICRO NUMBER:: 15112190
Specimen Quality: ADEQUATE

## 2022-12-13 MED ORDER — FLUCONAZOLE 150 MG PO TABS: 150.0000 mg | ORAL_TABLET | Freq: Once | ORAL | 0 refills | Status: AC

## 2022-12-13 MED ORDER — NYSTATIN 100000 UNIT/GM EX CREA: TOPICAL_CREAM | Freq: Two times a day (BID) | CUTANEOUS | 1 refills | Status: DC

## 2022-12-13 MED ORDER — HYDROCORTISONE (PERIANAL) 2.5 % EX CREA: 1.0000 | TOPICAL_CREAM | Freq: Two times a day (BID) | CUTANEOUS | 1 refills | Status: DC

## 2022-12-13 MED ORDER — HYDROCORTISONE ACETATE 25 MG RE SUPP: 25.0000 mg | Freq: Two times a day (BID) | RECTAL | 2 refills | Status: DC | PRN

## 2022-12-13 NOTE — Patient Instructions (Addendum)
Intertrigo Intertrigo is skin irritation or inflammation (dermatitis) that occurs when folds of skin rub together. The irritation can cause a rash and make skin raw and itchy. This condition mostly occurs in the skin folds of these areas: Armpits. Under the breasts. Under the abdomen. Groin. Buttocks. Toes. Intertrigo is not passed from person to person (is not contagious). What are the causes? This condition is caused by heat, moisture, rubbing (friction), and not enough air circulation. The condition can be made worse by: Sweat. Bacteria. A fungus, such as yeast. What increases the risk? This condition is more likely to occur if you have moisture in your skin folds. You are more likely to develop this condition if you: Are not able to move around or are not active. Live in a warm and moist climate. Are not able to control your bowels or bladder (have incontinence). Wear splints, braces, or other medical devices. Are overweight. Have diabetes. What are the signs or symptoms? Symptoms of this condition include: A pink or red skin rash in a skin fold or near a skin fold. Raw or scaly skin. Itchiness or burning. Bleeding. Leaking fluid. A bad smell. How is this diagnosed? This condition is diagnosed with a medical history and physical exam. You may also have a skin swab to test for bacteria or fungus. How is this treated? This condition may be treated by: Cleaning and drying your skin. Taking an antibiotic medicine or using an antibiotic skin cream for a bacterial infection. Using an antifungal cream on your skin or taking pills for an infection that was caused by a fungus, such as yeast. Using a steroid ointment to relieve itchiness and irritation. Separating the skin fold with a clean cotton cloth to absorb moisture and allow air to flow into the area. Follow these instructions at home: Keep the affected area clean and dry. Do not scratch your skin. Stay in a cool  environment as much as possible. Use an air conditioner or fan, if available. Apply over-the-counter and prescription medicines only as told by your health care provider. If you were prescribed antibiotics, use them as told by your health care provider. Do not stop using the antibiotic even if you start to feel better. Keep all follow-up visits. Your health care provider may need to check how well your skin is responding to the treatment. How is this prevented? Shower and dry yourself well after activity or exercise. Use a hair dryer on a cool setting to dry between skin folds, especially after you bathe. Do not wear tight clothes. Wear clothes that are loose, absorbent, and made of cotton. Wear a bra that gives good support, if needed. Protect the skin around your groin and buttocks, especially if you have incontinence. Skin protection includes: Following a regular cleaning routine. Using skin protectant creams, powders, or ointments. Changing protection pads frequently. Maintain a healthy weight. Take care of your feet, especially if you have diabetes. Foot care includes: Wearing shoes that fit well. Keeping your feet dry. Wearing clean, breathable socks. If you have diabetes, keep your blood sugar under control. Contact a health care provider if: Your symptoms do not improve with treatment. Your symptoms get worse or they spread. You notice increased redness and warmth. You have a fever. This information is not intended to replace advice given to you by your health care provider. Make sure you discuss any questions you have with your health care provider. Document Revised: 11/01/2021 Document Reviewed: 11/01/2021 Elsevier Patient Education  2024 Elsevier  Inc. Vaginal Yeast Infection, Adult  Vaginal yeast infection is a condition that causes vaginal discharge as well as soreness, swelling, and redness (inflammation) of the vagina. This is a common condition. Some women get this  infection frequently. What are the causes? This condition is caused by a change in the normal balance of the yeast (Candida) and normal bacteria that live in the vagina. This change causes an overgrowth of yeast, which causes the inflammation. What increases the risk? The condition is more likely to develop in women who: Take antibiotic medicines. Have diabetes. Take birth control pills. Are pregnant. Douche often. Have a weak body defense system (immune system). Have been taking steroid medicines for a long time. Frequently wear tight clothing. What are the signs or symptoms? Symptoms of this condition include: White, thick, creamy vaginal discharge. Swelling, itching, redness, and irritation of the vagina. The lips of the vagina (labia) may be affected as well. Pain or a burning feeling while urinating. Pain during sex. How is this diagnosed? This condition is diagnosed based on: Your medical history. A physical exam. A pelvic exam. Your health care provider will examine a sample of your vaginal discharge under a microscope. Your health care provider may send this sample for testing to confirm the diagnosis. How is this treated? This condition is treated with medicine. Medicines may be over-the-counter or prescription. You may be told to use one or more of the following: Medicine that is taken by mouth (orally). Medicine that is applied as a cream (topically). Medicine that is inserted directly into the vagina (suppository). Follow these instructions at home: Take or apply over-the-counter and prescription medicines only as told by your health care provider. Do not use tampons until your health care provider approves. Do not have sex until your infection has cleared. Sex can prolong or worsen your symptoms of infection. Ask your health care provider when it is safe to resume sexual activity. Keep all follow-up visits. This is important. How is this prevented?  Do not wear tight  clothes, such as pantyhose or tight pants. Wear breathable cotton underwear. Do not use douches, perfumed soap, creams, or powders. Wipe from front to back after using the toilet. If you have diabetes, keep your blood sugar levels under control. Ask your health care provider for other ways to prevent yeast infections. Contact a health care provider if: You have a fever. Your symptoms go away and then return. Your symptoms do not get better with treatment. Your symptoms get worse. You have new symptoms. You develop blisters in or around your vagina. You have blood coming from your vagina and it is not your menstrual period. You develop pain in your abdomen. Summary Vaginal yeast infection is a condition that causes discharge as well as soreness, swelling, and redness (inflammation) of the vagina. This condition is treated with medicine. Medicines may be over-the-counter or prescription. Take or apply over-the-counter and prescription medicines only as told by your health care provider. Do not douche. Resume sexual activity or use of tampons as instructed by your health care provider. Contact a health care provider if your symptoms do not get better with treatment or your symptoms go away and then return. This information is not intended to replace advice given to you by your health care provider. Make sure you discuss any questions you have with your health care provider. Document Revised: 08/28/2020 Document Reviewed: 08/28/2020 Elsevier Patient Education  2024 [employer].

## 2022-12-13 NOTE — Progress Notes (Signed)
Normal wet prep. Treatment plan stays the same.

## 2022-12-13 NOTE — Progress Notes (Signed)
Acute Office Visit  Subjective:     Patient ID: Rachael Hancock, female    DOB: August 31, 1955, 67 y.o.   MRN: [id number]  Chief Complaint  Patient presents with   Vaginal Itching    Patient with PMH of external hemorrhoids presents with vaginal and perianal itching x1wk. It is constant and "consumes me." She states it awakes her at night and worsens every time she urinates when it starts to burn. She has tried witch hazel, epsom salt baths, preparation H, and monistat over her vaginal and anal areas which help only for a short time. She states the area is now raw, so she has stopped using anything. She has had something similar occur before. She denies constipation, pain with BM, hematochezia, hematuria, and changes in vaginal discharge.   .. 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 01/08/2018   Mid back pain on left side 01/08/2019   Subclinical hyperthyroidism 06/09/2019   Left shoulder pain 06/09/2019   Adenomatous colon polyp 08/13/2013   Kidney stone on left side 04/08/2016   Left ureteral stone 03/26/2016   Aortic atherosclerosis (HCC) 03/10/2020   Epigastric abdominal tenderness without rebound tenderness 07/24/2020   Left upper quadrant abdominal tenderness without rebound tenderness 07/24/2020   Rib pain on left side 07/24/2020   Hematuria 07/24/2020   Migraine with aura 02/16/2021    Trochanteric bursitis, left hip 02/12/2022   Hemorrhoids 04/24/2022   Acute non-recurrent maxillary sinusitis 06/19/2022   Antibiotic-induced yeast infection 06/19/2022   Primary osteoarthritis of both hips 08/30/2022   Primary osteoarthritis of first carpometacarpal joint of right hand 08/30/2022   Resolved Ambulatory Problems    Diagnosis Date Noted   LOM 07/14/2009   LEG PAIN 05/23/2009   HTN (hypertension) 08/13/2013   Past Medical History:  Diagnosis Date   Anxiety    Arthritis    Complication of anesthesia    Depression    GERD (gastroesophageal reflux disease)    Headache    Hypertension    Reflux    Sleep apnea      Review of Systems  Constitutional:  Negative for chills and fever.  Gastrointestinal:  Negative for blood in stool, constipation, diarrhea and nausea.  Genitourinary:  Positive for dysuria and urgency. Negative for flank pain, frequency and hematuria.       Denies vaginal odor, erythema, changes in discharge.  Skin:  Positive for itching. Negative for rash.  Neurological:  Negative for tingling and sensory change.        Objective:    BP 138/66 (BP Location: Right Arm, Patient Position: Sitting, Cuff Size: Large)   Pulse 80   Ht 5\' 8"  (1.727 m)   Wt (!) 320 lb (145.2 kg)   SpO2 94%   BMI 48.66 kg/m  BP Readings from Last 3 Encounters:  12/13/22 138/66  08/15/22 137/63  07/18/22 134/64   Wt Readings from Last 3 Encounters:  12/13/22 (!) 320 lb (  145.2 kg)  08/15/22 (!) 336 lb (152.4 kg)  07/18/22 (!) 331 lb 0.6 oz (150.2 kg)      Physical Exam Constitutional:      Appearance: Normal appearance. She is obese.  Genitourinary:    Comments: Multiple, nonbloody external hemorrhoids noted in the anus. Some patches of thickened skin around the anus. No erythema, blood, discharge, or lesions.  Vaginal opening, vulva, and urethra unable to be visualized due to patient mobility.  Musculoskeletal:        General: Tenderness present. No  swelling.  Skin:    Findings: No bruising, erythema, lesion or rash.  Neurological:     General: No focal deficit present.     Mental Status: She is alert and oriented to person, place, and time.         Assessment & Plan:  Marland KitchenMarland KitchenEmerie was seen today for vaginal itching.  Diagnoses and all orders for this visit:  Vaginal itching -     POCT URINALYSIS DIP (CLINITEK) -     Cancel: WET PREP FOR TRICH, YEAST, CLUE -     Cancel: Urine Culture -     fluconazole (DIFLUCAN) 150 MG tablet; Take 1 tablet (150 mg total) by mouth once for 1 dose. Repeat in 48-72 hours as symptoms persist. -     nystatin cream (MYCOSTATIN); Apply topically 2 (two) times daily. -     WET PREP FOR TRICH, YEAST, CLUE  Anal itching -     POCT URINALYSIS DIP (CLINITEK) -     Cancel: WET PREP FOR TRICH, YEAST, CLUE -     Cancel: Urine Culture -     hydrocortisone (ANUSOL-HC) 2.5 % rectal cream; Place 1 Application rectally 2 (two) times daily. For hemorrhoids. -     hydrocortisone (ANUSOL-HC) 25 MG suppository; Place 1 suppository (25 mg total) rectally 2 (two) times daily as needed for hemorrhoids. -     fluconazole (DIFLUCAN) 150 MG tablet; Take 1 tablet (150 mg total) by mouth once for 1 dose. Repeat in 48-72 hours as symptoms persist. -     nystatin cream (MYCOSTATIN); Apply topically 2 (two) times daily. -     WET PREP FOR TRICH, YEAST, CLUE  Dysuria -     POCT URINALYSIS DIP (CLINITEK) -     Cancel: WET PREP FOR TRICH, YEAST, CLUE -     Cancel: Urine Culture -     WET PREP FOR TRICH, YEAST, CLUE  Hemorrhoids, unspecified hemorrhoid type -     hydrocortisone (ANUSOL-HC) 2.5 % rectal cream; Place 1 Application rectally 2 (two) times daily. For hemorrhoids. -     hydrocortisone (ANUSOL-HC) 25 MG suppository; Place 1 suppository (25 mg total) rectally 2 (two) times daily as needed for hemorrhoids.    Discussed yeast as the likely culprit with contribution from hemorrhoids Rx'd oral and topical antifungals  for vaginal and perianal areas Rx'd hemorrhoid cream and suppository Suggested icing the area to relieve itching. Discouraged using heat or warm water. Consider gold bond to keep groin and private areas dry Wet prep ordered today UA is negative for blood, nitrites, leukocytes.   Return if symptoms worsen or fail to improve.  Tandy Gaw, PA-C

## 2022-12-16 ENCOUNTER — Ambulatory Visit (INDEPENDENT_AMBULATORY_CARE_PROVIDER_SITE_OTHER): Payer: Medicare Other | Admitting: Sports Medicine

## 2022-12-16 DIAGNOSIS — J301 Allergic rhinitis due to pollen: Secondary | ICD-10-CM | POA: Diagnosis not present

## 2022-12-16 DIAGNOSIS — M160 Bilateral primary osteoarthritis of hip: Secondary | ICD-10-CM

## 2022-12-16 DIAGNOSIS — M19012 Primary osteoarthritis, left shoulder: Secondary | ICD-10-CM | POA: Diagnosis not present

## 2022-12-16 MED ORDER — TRAMADOL HCL 50 MG PO TABS: 50.0000 mg | ORAL_TABLET | Freq: Three times a day (TID) | ORAL | 0 refills | Status: DC

## 2022-12-16 MED ORDER — IPRATROPIUM BROMIDE 0.06 % NA SOLN
2.0000 | Freq: Four times a day (QID) | NASAL | 11 refills | Status: DC
Start: 2022-12-16 — End: 2023-07-14

## 2022-12-16 NOTE — Progress Notes (Signed)
    Procedures performed today:    None.  Independent interpretation of notes and tests performed by another provider:   None.  Brief History, Exam, Impression, and Recommendations:    Primary osteoarthritis of both hips Known bilateral hip osteoarthritis, right worse than left, last hip joint injection on the right was about 2 months ago, recurrence of pain, refilling tramadol, she can do diclofenac twice daily and Tylenol arthritis strength 3 times daily, I would like her to touch base with Dr. Luiz Blare for consideration of arthroplasty.  Allergic rhinitis Adding nasal Atrovent, follow this up with PCP    ____________________________________________ Ihor Austin. Benjamin Stain, M.D., ABFM., CAQSM., AME. Primary Care and Sports Medicine [facility]  Adjunct Professor of Family Medicine  [employer] [employer]  Restaurant manager, fast food

## 2022-12-16 NOTE — Assessment & Plan Note (Addendum)
Adding nasal Atrovent, follow this up with PCP

## 2022-12-16 NOTE — Assessment & Plan Note (Signed)
Known bilateral hip osteoarthritis, right worse than left, last hip joint injection on the right was about 2 months ago, recurrence of pain, refilling tramadol, she can do diclofenac twice daily and Tylenol arthritis strength 3 times daily, I would like her to touch base with Dr. Luiz Blare for consideration of arthroplasty.

## 2022-12-31 ENCOUNTER — Other Ambulatory Visit: Payer: Self-pay | Admitting: Sports Medicine

## 2022-12-31 DIAGNOSIS — M19012 Primary osteoarthritis, left shoulder: Secondary | ICD-10-CM

## 2023-01-02 ENCOUNTER — Encounter: Payer: Self-pay | Admitting: Family Medicine

## 2023-01-02 DIAGNOSIS — R202 Paresthesia of skin: Secondary | ICD-10-CM

## 2023-01-02 DIAGNOSIS — I10 Essential (primary) hypertension: Secondary | ICD-10-CM

## 2023-01-03 NOTE — Telephone Encounter (Signed)
Pt called again. Following up.

## 2023-01-03 NOTE — Telephone Encounter (Signed)
I would recommend to  call the pharmacy and see if they still have the old generic in stock and if they might be able to fill that for you if we send a new prescription.  I am wondering if it is the new medication it may not have the same type of coating on it and it may be creating that sensation on her tongue.  We can also have you come in for labs and check for B1, B6, and  B12 deficiency if she would like.  If you notice any tongue swelling then I need to know immediately and you need to STOP the medication. Marland Kitchen

## 2023-01-04 ENCOUNTER — Other Ambulatory Visit: Payer: Self-pay | Admitting: Family Medicine

## 2023-01-04 DIAGNOSIS — R600 Localized edema: Secondary | ICD-10-CM

## 2023-01-04 DIAGNOSIS — I10 Essential (primary) hypertension: Secondary | ICD-10-CM

## 2023-01-06 MED ORDER — LISINOPRIL 40 MG PO TABS
40.0000 mg | ORAL_TABLET | Freq: Every day | ORAL | 0 refills | Status: DC
Start: 2023-01-06 — End: 2023-01-29

## 2023-01-06 NOTE — Addendum Note (Signed)
Addended by: Chalmers Cater on: 01/06/2023 10:11 AM   Modules accepted: Orders

## 2023-01-06 NOTE — Addendum Note (Signed)
Addended by: Chalmers Cater on: 01/06/2023 10:16 AM   Modules accepted: Orders

## 2023-01-08 ENCOUNTER — Other Ambulatory Visit: Payer: Self-pay | Admitting: Family Medicine

## 2023-01-10 ENCOUNTER — Encounter: Payer: Self-pay | Admitting: Sports Medicine

## 2023-01-11 LAB — VITAMIN B6: Vitamin B6: 4.9 ng/mL (ref 2.1–21.7)

## 2023-01-13 NOTE — Progress Notes (Signed)
Hi Halyn, your vitamin B6 looks great.

## 2023-01-19 ENCOUNTER — Encounter: Payer: Self-pay | Admitting: Family Medicine

## 2023-01-21 NOTE — Telephone Encounter (Signed)
Attempted call to patient. Left a voice mail message to return our call.

## 2023-01-21 NOTE — Telephone Encounter (Signed)
Patient informed. 

## 2023-01-21 NOTE — Telephone Encounter (Signed)
That would be awesome to let her know about the info session

## 2023-01-21 NOTE — Telephone Encounter (Signed)
Lets see how far [facility] [place] and if they take medicare?

## 2023-01-29 ENCOUNTER — Other Ambulatory Visit: Payer: Self-pay | Admitting: Family Medicine

## 2023-01-29 DIAGNOSIS — I10 Essential (primary) hypertension: Secondary | ICD-10-CM

## 2023-02-11 ENCOUNTER — Encounter: Payer: Self-pay | Admitting: Family Medicine

## 2023-02-11 ENCOUNTER — Ambulatory Visit (INDEPENDENT_AMBULATORY_CARE_PROVIDER_SITE_OTHER): Payer: Medicare Other | Admitting: Family Medicine

## 2023-02-11 VITALS — BP 135/64 | HR 66 | Ht 68.0 in | Wt 315.0 lb

## 2023-02-11 DIAGNOSIS — I700 Atherosclerosis of aorta: Secondary | ICD-10-CM

## 2023-02-11 DIAGNOSIS — I10 Essential (primary) hypertension: Secondary | ICD-10-CM | POA: Diagnosis not present

## 2023-02-11 DIAGNOSIS — E0590 Thyrotoxicosis, unspecified without thyrotoxic crisis or storm: Secondary | ICD-10-CM

## 2023-02-11 DIAGNOSIS — R200 Anesthesia of skin: Secondary | ICD-10-CM

## 2023-02-11 DIAGNOSIS — E559 Vitamin D deficiency, unspecified: Secondary | ICD-10-CM

## 2023-02-11 MED ORDER — LEVOTHYROXINE SODIUM 25 MCG PO TABS: 25.0000 ug | ORAL_TABLET | Freq: Every day | ORAL | 1 refills | Status: DC

## 2023-02-11 NOTE — Assessment & Plan Note (Signed)
No chest pain

## 2023-02-11 NOTE — Patient Instructions (Signed)
Plan to recheck labs in 6 weeks after starting thyroid medication.

## 2023-02-11 NOTE — Assessment & Plan Note (Signed)
To check TSH and free T4 today and we will start a low-dose of levothyroxine.  Plan to recheck labs in 6 weeks.  I do think this will assist with weight loss.

## 2023-02-11 NOTE — Assessment & Plan Note (Signed)
They have send in a vitamin D supplement for her to start her recent labs were low.

## 2023-02-11 NOTE — Assessment & Plan Note (Signed)
Pressures just a little elevated today.  Will recheck again before she leaves.

## 2023-02-11 NOTE — Progress Notes (Signed)
Established Patient Office Visit  Subjective   Patient ID: Rachael Hancock, female    DOB: Mar 06, 1956  Age: 67 y.o. MRN: [id number]  Chief Complaint  Patient presents with   Hypertension   Hypothyroidism    HPI  Follow-up on thyroid-she recently had labs with [facility].  And her TSH was elevated around 6 they recommended that she follow-up with [place].  She does have a history of subclinical hypothyroidism in the last we checked her TSH was around 5 but it has been over a year.  Hypertension- Pt denies chest pain, SOB, dizziness, or heart palpitations.  Taking meds as directed w/o problems.  Denies medication side effects.    She needs hip replacement and so the orthopedist wants her BMI down to 40.  It is currently 61.  So that is in part why she is also working with the clinic to get to her goal for hip replacement.     ROS    Objective:     BP 135/64   Pulse 66   Ht 5\' 8"  (1.727 m)   Wt (!) 315 lb (142.9 kg)   SpO2 98%   BMI 47.90 kg/m    Physical Exam Vitals and nursing note reviewed.  Constitutional:      Appearance: She is well-developed.  HENT:     Head: Normocephalic and atraumatic.  Cardiovascular:     Rate and Rhythm: Normal rate and regular rhythm.     Heart sounds: Normal heart sounds.  Pulmonary:     Effort: Pulmonary effort is normal.     Breath sounds: Normal breath sounds.  Skin:    General: Skin is warm and dry.  Neurological:     Mental Status: She is alert and oriented to person, place, and time.  Psychiatric:        Behavior: Behavior normal.      No results found for any visits on 02/11/23.    The ASCVD Risk score (Arnett DK, et al., 2019) failed to calculate for the following reasons:   The valid total cholesterol range is 130 to 320 mg/dL    Assessment & Plan:   Problem List Items Addressed This Visit       Cardiovascular and Mediastinum   Essential hypertension, benign    Pressures just a little  elevated today.  Will recheck again before she leaves.      Aortic atherosclerosis (HCC)    No chest pain.       Relevant Orders   TSH + free T4   Lipid panel     Endocrine   Subclinical hyperthyroidism - Primary    To check TSH and free T4 today and we will start a low-dose of levothyroxine.  Plan to recheck labs in 6 weeks.  I do think this will assist with weight loss.      Relevant Medications   levothyroxine (SYNTHROID) 25 MCG tablet   Other Relevant Orders   TSH + free T4     Other   Vitamin D deficiency    They have send in a vitamin D supplement for her to start her recent labs were low.      Other Visit Diagnoses     Numbness of tongue       Relevant Orders   TSH + free T4   Lipid panel      Numbness of tongue seems to have resolved with changing the brand of lisinopril. The coating on the new  one was causing the sensation.   Return for Hypertension.    [name], MD

## 2023-02-12 LAB — LIPID PANEL
Chol/HDL Ratio: 2.4 ratio (ref 0.0–4.4)
Cholesterol, Total: 134 mg/dL (ref 100–199)
HDL: 55 mg/dL (ref >39)
LDL Chol Calc (NIH): 64 mg/dL (ref 0–99)
Triglycerides: 76 mg/dL (ref 0–149)
VLDL Cholesterol Cal: 15 mg/dL (ref 5–40)

## 2023-02-12 LAB — TSH+FREE T4
Free T4: 1.27 ng/dL (ref 0.82–1.77)
TSH: 6.73 u[IU]/mL — ABNORMAL HIGH (ref 0.450–4.500)

## 2023-02-12 NOTE — Progress Notes (Signed)
Hi Rachael Hancock, repeat thyroid was 6.7 similar to what they got.  The T4 is still technically normal.  So it still consistent with subclinical.  But I did go ahead and send over the levothyroxine 25 mcg to start taking once daily the plan will be to recheck your level again in 6 to 8 weeks and we can make any adjustments needed with the medication at that time.  Cholesterol panel looks good.

## 2023-02-13 ENCOUNTER — Encounter (INDEPENDENT_AMBULATORY_CARE_PROVIDER_SITE_OTHER): Payer: Self-pay | Admitting: Physician Assistant

## 2023-02-13 ENCOUNTER — Telehealth: Payer: Self-pay | Admitting: Family Medicine

## 2023-02-13 MED ORDER — LISINOPRIL 40 MG PO TABS
40.0000 mg | ORAL_TABLET | Freq: Every day | ORAL | 3 refills | Status: DC
Start: 2023-02-13 — End: 2024-02-16

## 2023-02-13 NOTE — Telephone Encounter (Signed)
Patient informed of results.  

## 2023-02-13 NOTE — Telephone Encounter (Signed)
Pt returned your call.  

## 2023-02-17 ENCOUNTER — Encounter: Payer: Self-pay | Admitting: Family Medicine

## 2023-02-17 ENCOUNTER — Other Ambulatory Visit: Payer: Self-pay | Admitting: Family Medicine

## 2023-02-17 DIAGNOSIS — I10 Essential (primary) hypertension: Secondary | ICD-10-CM

## 2023-02-17 DIAGNOSIS — R600 Localized edema: Secondary | ICD-10-CM

## 2023-02-17 DIAGNOSIS — E0590 Thyrotoxicosis, unspecified without thyrotoxic crisis or storm: Secondary | ICD-10-CM

## 2023-02-19 MED ORDER — SYNTHROID 25 MCG PO TABS: 25.0000 ug | ORAL_TABLET | Freq: Every day | ORAL | 1 refills | Status: DC

## 2023-02-19 NOTE — Telephone Encounter (Signed)
Meds ordered this encounter  Medications   SYNTHROID 25 MCG tablet    Sig: Take 1 tablet (25 mcg total) by mouth daily before breakfast.    Dispense:  30 tablet    Refill:  1

## 2023-02-20 ENCOUNTER — Other Ambulatory Visit: Payer: Self-pay | Admitting: Sports Medicine

## 2023-02-20 DIAGNOSIS — M19012 Primary osteoarthritis, left shoulder: Secondary | ICD-10-CM

## 2023-03-25 ENCOUNTER — Encounter: Payer: Self-pay | Admitting: Family Medicine

## 2023-03-25 ENCOUNTER — Ambulatory Visit (INDEPENDENT_AMBULATORY_CARE_PROVIDER_SITE_OTHER): Payer: Medicare Other | Admitting: Family Medicine

## 2023-03-25 VITALS — BP 131/67 | HR 82 | Ht 68.0 in | Wt 307.0 lb

## 2023-03-25 DIAGNOSIS — I10 Essential (primary) hypertension: Secondary | ICD-10-CM

## 2023-03-25 DIAGNOSIS — E559 Vitamin D deficiency, unspecified: Secondary | ICD-10-CM | POA: Diagnosis not present

## 2023-03-25 DIAGNOSIS — Z23 Encounter for immunization: Secondary | ICD-10-CM | POA: Diagnosis not present

## 2023-03-25 DIAGNOSIS — Z6841 Body Mass Index (BMI) 40.0 and over, adult: Secondary | ICD-10-CM | POA: Insufficient documentation

## 2023-03-25 DIAGNOSIS — E038 Other specified hypothyroidism: Secondary | ICD-10-CM | POA: Diagnosis not present

## 2023-03-25 DIAGNOSIS — E0590 Thyrotoxicosis, unspecified without thyrotoxic crisis or storm: Secondary | ICD-10-CM

## 2023-03-25 MED ORDER — LEVOTHYROXINE SODIUM 25 MCG PO TABS
25.0000 ug | ORAL_TABLET | Freq: Every day | ORAL | 1 refills | Status: DC
Start: 2023-03-25 — End: 2023-07-14

## 2023-03-25 NOTE — Assessment & Plan Note (Addendum)
She is down 13 lbs on her own. She was given topamax and phentermine for weight loss but hasn't started it yet.  We discussed the medication and potential side effects.  I would recommend that she start with a half a tab of the phentermine and see how she tolerates that monitoring her blood pressure and pulse at home.  This she could then add on the Topamax.  Continue with healthy diet, increased vegetable intake to help her feel more full and staying as active as she can.  Goal is to get her BMI under 40 for surgery.

## 2023-03-25 NOTE — Assessment & Plan Note (Signed)
Plan to recheck Vitamin D.

## 2023-03-25 NOTE — Assessment & Plan Note (Signed)
The thyroid medication that she picked up did have red dye in it so we will need to updated prescription to the pharmacy but we also discussed that if she wanted to hold off on taking it she certainly could I just thought that it might help with her weight loss goals even though she is subclinical with so we did discuss pros and cons of the medication.

## 2023-03-25 NOTE — Assessment & Plan Note (Signed)
Well controlled. Continue current regimen. Follow up in  6 mo  

## 2023-03-25 NOTE — Progress Notes (Addendum)
Established Patient Office Visit  Subjective   Patient ID: Rachael Hancock, female    DOB: July 22, 1955  Age: 67 y.o. MRN: [id number]  Chief Complaint  Patient presents with   Hypertension    HPI  F/U subclinical hypothyroidism - we opted to do a trial of levo to assist with her weight loss.  She is also working with healthy weight and wellness.  She is not on biotin or lithium.  She decided not start the medication yet.  Lab Results  Component Value Date   TSH 6.730 (H) 02/11/2023   She is still working on weight loss she has been able to get off about 13 pounds on her own and did go for consultation [facility]ariat[facility] they suggested putting her on Topamax and phentermine to help her get to her goal more quickly so that she can have surgery.  She had some questions about the medication before starting it.    ROS    Objective:     BP 131/67   Pulse 82   Ht 5\' 8"  (1.727 m)   Wt (!) 307 lb (139.3 kg)   SpO2 96%   BMI 46.68 kg/m    Physical Exam Vitals and nursing note reviewed.  Constitutional:      Appearance: Normal appearance.  HENT:     Head: Normocephalic and atraumatic.  Eyes:     Conjunctiva/sclera: Conjunctivae normal.  Cardiovascular:     Rate and Rhythm: Normal rate and regular rhythm.  Pulmonary:     Effort: Pulmonary effort is normal.     Breath sounds: Normal breath sounds.  Skin:    General: Skin is warm and dry.  Neurological:     Mental Status: She is alert.  Psychiatric:        Mood and Affect: Mood normal.     No results found for any visits on 03/25/23.    The 10-year ASCVD risk score (Arnett DK, et al., 2019) is: 7.2%    Assessment & Plan:   Problem List Items Addressed This Visit       Cardiovascular and Mediastinum   Essential hypertension, benign - Primary    Well controlled. Continue current regimen. Follow up in  16mo       Relevant Orders   B12   VITAMIN D 25 Hydroxy (Vit-D Deficiency, Fractures)    CMP14+EGFR     Endocrine   Subclinical hyperthyroidism    The thyroid medication that she picked up did have red dye in it so we will need to updated prescription to the pharmacy but we also discussed that if she wanted to hold off on taking it she certainly could I just thought that it might help with her weight loss goals even though she is subclinical with so we did discuss pros and cons of the medication.      Relevant Medications   levothyroxine (SYNTHROID) 25 MCG tablet     Other   Vitamin D deficiency    Plan to recheck Vitamin D.       Relevant Orders   B12   VITAMIN D 25 Hydroxy (Vit-D Deficiency, Fractures)   CMP14+EGFR   BMI 45.0-49.9, adult (HCC)    She is down 13 lbs on her own. She was given topamax and phentermine for weight loss but hasn't started it yet.  We discussed the medication and potential side effects.  I would recommend that she start with a half a tab of the phentermine and see how  she tolerates that monitoring her blood pressure and pulse at home.  This she could then add on the Topamax.  Continue with healthy diet, increased vegetable intake to help her feel more full and staying as active as she can.  Goal is to get her BMI under 40 for surgery.      Other Visit Diagnoses     Subclinical hypothyroidism       Relevant Medications   levothyroxine (SYNTHROID) 25 MCG tablet   Other Relevant Orders   B12   VITAMIN D 25 Hydroxy (Vit-D Deficiency, Fractures)   CMP14+EGFR   Encounter for immunization       Relevant Orders   Flu Vaccine Trivalent High Dose (Fluad) (Completed)       Return in about 4 months (around 07/26/2023) for thyroid .    [name], MD

## 2023-03-25 NOTE — Addendum Note (Signed)
Addended by: [name] D on: 03/25/2023 10:35 AM   Modules accepted: Orders

## 2023-03-26 LAB — CMP14+EGFR
ALT: 27 [IU]/L (ref 0–32)
AST: 26 [IU]/L (ref 0–40)
Albumin: 4.4 g/dL (ref 3.9–4.9)
Alkaline Phosphatase: 114 [IU]/L (ref 44–121)
BUN/Creatinine Ratio: 17 (ref 12–28)
BUN: 14 mg/dL (ref 8–27)
Bilirubin Total: 0.9 mg/dL (ref 0.0–1.2)
CO2: 22 mmol/L (ref 20–29)
Calcium: 9.7 mg/dL (ref 8.7–10.3)
Chloride: 104 mmol/L (ref 96–106)
Creatinine, Ser: 0.81 mg/dL (ref 0.57–1.00)
Globulin, Total: 2.7 g/dL (ref 1.5–4.5)
Glucose: 91 mg/dL (ref 70–99)
Potassium: 3.5 mmol/L (ref 3.5–5.2)
Sodium: 143 mmol/L (ref 134–144)
Total Protein: 7.1 g/dL (ref 6.0–8.5)
eGFR: 80 mL/min/{1.73_m2}

## 2023-03-26 LAB — VITAMIN B12: Vitamin B-12: 344 pg/mL (ref 232–1245)

## 2023-03-26 LAB — VITAMIN D 25 HYDROXY (VIT D DEFICIENCY, FRACTURES): Vit D, 25-Hydroxy: 38.2 ng/mL (ref 30.0–100.0)

## 2023-03-26 NOTE — Progress Notes (Signed)
HI Rachael Hancock,  Vitamin B12 looks better at 344.  Vitamin D also looks much better it is up to 38 we want a try to get that above 40 if possible.  Metabolic panel including kidney function looks great.  The kidney function does look a little better compared to last time.  Please schedule your Medicare wellness exam with our Medicare wellness nurse if you have not had a chance to.

## 2023-03-28 ENCOUNTER — Other Ambulatory Visit: Payer: Self-pay | Admitting: Sports Medicine

## 2023-03-28 DIAGNOSIS — M19012 Primary osteoarthritis, left shoulder: Secondary | ICD-10-CM

## 2023-03-31 ENCOUNTER — Ambulatory Visit: Payer: Medicare Other

## 2023-04-09 ENCOUNTER — Other Ambulatory Visit: Payer: Self-pay | Admitting: Family Medicine

## 2023-04-09 DIAGNOSIS — I10 Essential (primary) hypertension: Secondary | ICD-10-CM

## 2023-04-09 DIAGNOSIS — R600 Localized edema: Secondary | ICD-10-CM

## 2023-04-13 ENCOUNTER — Encounter: Payer: Self-pay | Admitting: Family Medicine

## 2023-04-15 ENCOUNTER — Encounter: Payer: Self-pay | Admitting: Family Medicine

## 2023-04-15 ENCOUNTER — Telehealth: Payer: Self-pay | Admitting: Family Medicine

## 2023-04-15 ENCOUNTER — Telehealth: Payer: Self-pay | Admitting: Sports Medicine

## 2023-04-15 DIAGNOSIS — M19012 Primary osteoarthritis, left shoulder: Secondary | ICD-10-CM

## 2023-04-15 DIAGNOSIS — M160 Bilateral primary osteoarthritis of hip: Secondary | ICD-10-CM

## 2023-04-15 MED ORDER — TRAMADOL HCL 50 MG PO TABS
50.0000 mg | ORAL_TABLET | Freq: Three times a day (TID) | ORAL | 0 refills | Status: AC
Start: 2023-04-15 — End: ?

## 2023-04-15 NOTE — Telephone Encounter (Signed)
I will do a single additional refill to hold her over in the meantime.

## 2023-04-15 NOTE — Telephone Encounter (Signed)
Patient's care was transferred to Dr. Luiz Blare with orthopedic surgery, I have nothing else to offer her, and not just refilling pain medication.

## 2023-04-15 NOTE — Telephone Encounter (Signed)
Prescription Request  04/15/2023  LOV: 12/16/2022  What is the name of the medication or equipment? traMADol (ULTRAM) 50 MG tablet   Have you contacted your pharmacy to request a refill? Yes   Which pharmacy would you like this sent to?   [facility] - [place], [place] - [address] [place] [address] Phone: [phone] Fax: [phone]   Phone: [phone] Fax: [phone]    Patient notified that their request is being sent to the clinical staff for review and that they should receive a response within 2 business days.   Please advise at [facility] [phone]

## 2023-04-15 NOTE — Telephone Encounter (Signed)
Patient states that she needs a letter for medical excuse from jury duty

## 2023-04-15 NOTE — Telephone Encounter (Signed)
Spoke with patient. She had reached out to Dr. Luiz Blare  through the pharmacy refill request but has not had a response from Dr. Luiz Blare office. She was told that she must lose weight before surgery can be done - she will reach out directly to DR. Graves office to see what can be done for this refill. She is out of medication and in a lot of pain .

## 2023-04-15 NOTE — Telephone Encounter (Signed)
K for letter

## 2023-04-16 NOTE — Telephone Encounter (Signed)
Attempted call to patient. Left voice mail message requesting a return call.  

## 2023-04-17 NOTE — Telephone Encounter (Signed)
Patient informed. States she saw Dr. Luiz Blare this morning and he is prescribing the medication for her.

## 2023-04-17 NOTE — Telephone Encounter (Signed)
Attempted call to patient. Left a voice mail message requesting a return call.  

## 2023-04-26 ENCOUNTER — Other Ambulatory Visit: Payer: Self-pay | Admitting: Sports Medicine

## 2023-04-26 DIAGNOSIS — M19012 Primary osteoarthritis, left shoulder: Secondary | ICD-10-CM

## 2023-05-08 ENCOUNTER — Encounter: Payer: Self-pay | Admitting: Family Medicine

## 2023-05-08 ENCOUNTER — Ambulatory Visit (INDEPENDENT_AMBULATORY_CARE_PROVIDER_SITE_OTHER): Payer: Medicare Other | Admitting: Family Medicine

## 2023-05-08 VITALS — BP 132/75 | HR 90 | Ht 68.0 in | Wt 298.0 lb

## 2023-05-08 DIAGNOSIS — M5416 Radiculopathy, lumbar region: Secondary | ICD-10-CM | POA: Diagnosis not present

## 2023-05-08 DIAGNOSIS — M25551 Pain in right hip: Secondary | ICD-10-CM

## 2023-05-08 DIAGNOSIS — J029 Acute pharyngitis, unspecified: Secondary | ICD-10-CM | POA: Diagnosis not present

## 2023-05-08 LAB — POCT RAPID STREP A (OFFICE): Rapid Strep A Screen: NEGATIVE

## 2023-05-08 NOTE — Progress Notes (Signed)
Established Patient Office Visit  Subjective   Patient ID: Helene Labo, female    DOB: Dec 25, 1955  Age: 67 y.o. MRN: [id number]  No chief complaint on file.   HPI  ST x 3 days with bad taste in her mouth. Wanted to make sure not strep. No sinus congestion. Some drainage.  Dry mouth at night  Still having a lot of right hip pain but for the last week has been hurting in her right low back as well.    Working hard to get her weight and BMI down for right hip surgery. Has lost about 20 lbs so far. Working with bariatric solutions with [facility].     ROS    Objective:     BP 132/75   Pulse 90   Ht 5\' 8"  (1.727 m)   Wt 298 lb (135.2 kg)   SpO2 99%   BMI 45.31 kg/m    Physical Exam Vitals and nursing note reviewed.  Constitutional:      Appearance: Normal appearance.  HENT:     Head: Normocephalic and atraumatic.     Mouth/Throat:     Comments: OP with mild erythema and cobblestoning.  Eyes:     Conjunctiva/sclera: Conjunctivae normal.  Cardiovascular:     Rate and Rhythm: Normal rate and regular rhythm.  Pulmonary:     Effort: Pulmonary effort is normal.     Breath sounds: Normal breath sounds.  Skin:    General: Skin is warm and dry.  Neurological:     Mental Status: She is alert.  Psychiatric:        Mood and Affect: Mood normal.     Results for orders placed or performed in visit on 05/08/23  POCT rapid strep A  Result Value Ref Range   Rapid Strep A Screen Negative Negative      The 10-year ASCVD risk score (Arnett DK, et al., 2019) is: 7.3%    Assessment & Plan:   Problem List Items Addressed This Visit   None Visit Diagnoses     Right hip pain    -  Primary   Sore throat       Relevant Orders   POCT rapid strep A (Completed)   Acute radicular low back pain           Acute radicular low back pain-suspect contributing to some of her right lateral hip pain.  This is just new over the last week.  She says she feels better if she  sitting and resting it is worse if she tries to lay down.  She just finished a round of steroids about 3 weeks ago some hesitant to put her on another round but if she is not improving we could do that.  Recommend a trial of a lighted Derm patch at least on that hip.  Discussed that it probably would not do a lot for her back.  Okay to take the extra tab of tramadol at lunchtime if needed.  And recommend adding Tylenol arthritis on top of the Voltaren tabs that she is taking twice a day for improved pain relief.  Is really doing fantastic with her weight loss and she is down another 9 pounds since she was last here.  Goal is to get her BMI down to 40 so that she can have right hip replacement.  Sore throat-she does have a little bit of mild cobblestoning posteriorly.  Strep test negative.  Call if not improving, getting worse or developing  new or worsening symptoms over the next week.  No follow-ups on file.    [name], MD

## 2023-05-13 ENCOUNTER — Encounter: Payer: Self-pay | Admitting: Family Medicine

## 2023-05-13 DIAGNOSIS — J029 Acute pharyngitis, unspecified: Secondary | ICD-10-CM

## 2023-05-15 ENCOUNTER — Other Ambulatory Visit: Payer: Self-pay | Admitting: Cardiology

## 2023-05-16 NOTE — Progress Notes (Signed)
HI Rachael Hancock,  So far negative for CMV.  Still awaiting the EBV test.  Keep try to use your Tylenol and salt water gargles.  You can also use a nasal saline rinse just to clear out any extra drainage that might be causing some irritation if as it is going down the back of your throat.  If you have any swollen lymph nodes or anything let me know.  Your blood count looks normal and that is reassuring as well.

## 2023-05-17 LAB — CBC WITH DIFFERENTIAL/PLATELET
Basophils Absolute: 0.1 10*3/uL (ref 0.0–0.2)
Basos: 1 %
EOS (ABSOLUTE): 0.2 10*3/uL (ref 0.0–0.4)
Eos: 3 %
Hematocrit: 43 % (ref 34.0–46.6)
Hemoglobin: 14.2 g/dL (ref 11.1–15.9)
Immature Grans (Abs): 0 10*3/uL (ref 0.0–0.1)
Immature Granulocytes: 0 %
Lymphocytes Absolute: 2.6 10*3/uL (ref 0.7–3.1)
Lymphs: 38 %
MCH: 29.7 pg (ref 26.6–33.0)
MCHC: 33 g/dL (ref 31.5–35.7)
MCV: 90 fL (ref 79–97)
Monocytes Absolute: 0.5 10*3/uL (ref 0.1–0.9)
Monocytes: 8 %
Neutrophils Absolute: 3.5 10*3/uL (ref 1.4–7.0)
Neutrophils: 50 %
Platelets: 299 10*3/uL (ref 150–450)
RBC: 4.78 x10E6/uL (ref 3.77–5.28)
RDW: 13.7 % (ref 11.7–15.4)
WBC: 6.9 10*3/uL (ref 3.4–10.8)

## 2023-05-17 LAB — CMV ABS, IGG+IGM (CYTOMEGALOVIRUS)
CMV Ab - IgG: 6.2 U/mL — ABNORMAL HIGH (ref 0.00–0.59)
CMV IgM Ser EIA-aCnc: <30 [AU]/ml (ref 0.0–29.9)

## 2023-05-17 LAB — EBV AB TO VIRAL CAPSID AG PNL, IGG+IGM
EBV VCA IgG: >600 U/mL — ABNORMAL HIGH (ref 0.0–17.9)
EBV VCA IgM: <36 U/mL (ref 0.0–35.9)

## 2023-05-19 NOTE — Progress Notes (Signed)
HI Rachael Hancock, you are negative for CMV you do have antibodies which means that you have been exposed at some point in your lifetime.  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  Your blood count looks normal and reassuring are you feeling any better?  Or is your throat really still bothering you?

## 2023-05-20 MED ORDER — AZITHROMYCIN 250 MG PO TABS: ORAL_TABLET | ORAL | 0 refills | Status: AC

## 2023-05-20 NOTE — Addendum Note (Signed)
Addended by: [name] D on: 05/20/2023 06:01 PM   Modules accepted: Orders

## 2023-05-20 NOTE — Progress Notes (Signed)
Going to go ahead and send over a Z-Pak.  If she is not better after the round of antibiotics then please let us know.

## 2023-05-26 ENCOUNTER — Other Ambulatory Visit: Payer: Self-pay | Admitting: Sports Medicine

## 2023-05-26 DIAGNOSIS — M19012 Primary osteoarthritis, left shoulder: Secondary | ICD-10-CM

## 2023-05-29 ENCOUNTER — Other Ambulatory Visit: Payer: Self-pay | Admitting: Cardiology

## 2023-05-29 DIAGNOSIS — R931 Abnormal findings on diagnostic imaging of heart and coronary circulation: Secondary | ICD-10-CM

## 2023-06-20 NOTE — Progress Notes (Signed)
 HPI: FU CP and palpitations. Previous monitors showed sinus with PACs and PVCs. Last echocardiogram April 2021 showed normal LV function, mild left ventricular enlargement, grade 1 diastolic dysfunction. Calcium  score April 2021 12 which was 68 percentile.  Lower extremity venous Doppler September 2021 showed no DVT in the left lower extremity.  CTA November 2023 showed no pulmonary embolus.  Since last seen, she denies chest pain, dyspnea, palpitations or syncope.  Current Outpatient Medications  Medication Sig Dispense Refill   AMBULATORY NON FORMULARY MEDICATION oral appliance therapy - mouthpiece for cpap machine. 1 each 0   amLODipine  (NORVASC ) 10 MG tablet Take 1 tablet by mouth once daily 30 tablet 1   diclofenac  (VOLTAREN ) 75 MG EC tablet Take 1 tablet by mouth twice daily 60 tablet 0   furosemide  (LASIX ) 20 MG tablet TAKE 1 TABLET BY MOUTH ONCE DAILY AS NEEDED FOR EDEMA 30 tablet 3   hydrocortisone  (ANUSOL -HC) 2.5 % rectal cream Place 1 Application rectally 2 (two) times daily. For hemorrhoids. 28 g 1   hydrocortisone  (ANUSOL -HC) 25 MG suppository Place 1 suppository (25 mg total) rectally 2 (two) times daily as needed for hemorrhoids. 24 suppository 2   ipratropium (ATROVENT ) 0.06 % nasal spray Place 2 sprays into both nostrils 4 (four) times daily. 15 mL 11   levothyroxine  (SYNTHROID ) 25 MCG tablet Take 1 tablet (25 mcg total) by mouth daily before breakfast. 30 tablet 1   lisinopril  (ZESTRIL ) 40 MG tablet Take 1 tablet (40 mg total) by mouth daily. Manufacturer Lynne only please 90 tablet 3   loratadine (CLARITIN) 10 MG tablet Take 10 mg by mouth daily.     nystatin  cream (MYCOSTATIN ) Apply topically 2 (two) times daily. 30 g 1   omeprazole  (PRILOSEC) 40 MG capsule TAKE 1 CAPSULE BY MOUTH EVERY DAY 90 capsule 3   polyethylene glycol (MIRALAX / GLYCOLAX) 17 g packet Take 17 g by mouth daily.     rosuvastatin  (CRESTOR ) 20 MG tablet Take 1 tablet by mouth once daily 90 tablet 0    traMADol  (ULTRAM ) 50 MG tablet Take 1 tablet (50 mg total) by mouth in the morning, at noon, and at bedtime. 90 tablet 0   triamcinolone  cream (KENALOG ) 0.5 % Apply 1 Application topically 2 (two) times daily as needed. 30 g 2   No current facility-administered medications for this visit.     Past Medical History:  Diagnosis Date   Anxiety    Arthritis    Complication of anesthesia    HA  WITH SPINAL, WISDOM TO AWHILE TO WAKE UP WHEN YOUNGER-    Depression    GERD (gastroesophageal reflux disease)    Headache    MIGRAINE   Hypertension    Morbid obesity (HCC)    Palpitations    Reflux    Sleep apnea    BORDERLINE -  UNABLE TO USE CPAP 4-5 YRS AGO    Past Surgical History:  Procedure Laterality Date   ANKLE FRACTURE SURGERY     cyst removed     KIDNEY STONE SURGERY     NOVASURE ABLATION     TOTAL SHOULDER ARTHROPLASTY Right 01/08/2018   Procedure: RIGHT TOTAL SHOULDER ARTHROPLASTY;  Surgeon: Dozier Soulier, MD;  Location: [facility] OR;  Service: Orthopedics;  Laterality: Right;   WISDOM TOOTH EXTRACTION      Social History   Socioeconomic History   Marital status: Married    Spouse name: Tim   Number of children: 2   Years of  education: 3 YRS COLL   Highest education level: Not on file  Occupational History   Occupation: CUSTOMER SERVICE REP    Employer: SELF    Comment: [employer]  Tobacco Use   Smoking status: Never   Smokeless tobacco: Never  Vaping Use   Vaping status: Never Used  Substance and Sexual Activity   Alcohol use: Yes    Alcohol/week: 0.0 standard drinks of alcohol    Comment: Rare   Drug use: No   Sexual activity: Not on file  Other Topics Concern   Not on file  Social History Narrative   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[employer]   3 YRS COLLEGE   MARRIED TO TIM   1 DAUGHTER   Social Drivers of Health   Financial Resource Strain: Patient Declined (02/05/2023)   Received from [facility]   Overall Financial Resource  Strain (CARDIA)    Difficulty of Paying Living Expenses: Patient declined  Food Insecurity: Patient Declined (02/05/2023)   Received from [facility]   Hunger Vital Sign    Worried About Running Out of Food in the Last Year: Patient declined    Ran Out of Food in the Last Year: Patient declined  Transportation Needs: Patient Declined (02/05/2023)   Received from [facility] - Transportation    Lack of Transportation (Medical): Patient declined    Lack of Transportation (Non-Medical): Patient declined  Physical Activity: Not on file  Stress: Not on file  Social Connections: Unknown (11/02/2021)   Received from [facility]   Social Network    Social Network: Not on file  Intimate Partner Violence: Unknown (09/25/2021)   Received from [employer], [facility]   HITS    Physically Hurt: Not on file    Insult or Talk Down To: Not on file    Threaten Physical Harm: Not on file    Scream or Curse: Not on file    Family History  Problem Relation Age of Onset   Heart attack Father        at age 63   Cancer Paternal Grandmother    Coronary artery disease Other    Arrhythmia Mother        pacemaker insertion   Diabetes Maternal Aunt     ROS: Hip pain but no fevers or chills, productive cough, hemoptysis, dysphasia, odynophagia, melena, hematochezia, dysuria, hematuria, rash, seizure activity, orthopnea, PND, pedal edema, claudication. Remaining systems are negative.  Physical Exam: Well-developed obese in no acute distress.  Skin is warm and dry.  HEENT is normal.  Neck is supple.  Chest is clear to auscultation with normal expansion.  Cardiovascular exam is regular rate and rhythm.  Abdominal exam nontender or distended. No masses palpated. Extremities show no edema. neuro grossly intact  EKG Interpretation Date/Time:  Wednesday July 02 2023 09:40:05 EST Ventricular Rate:  89 PR Interval:  146 QRS Duration:  80 QT Interval:  348 QTC  Calculation: 423 R Axis:   65  Text Interpretation: Sinus rhythm with Premature supraventricular complexes When compared with ECG of 01-Jan-2018 15:20, Premature supraventricular complexes are now Present T wave amplitude has decreased in Anterior leads Confirmed by Pietro Rogue ([id number]) on 07/02/2023 9:40:57 AM    A/P  1 history of chest pain-no recent exertional symptoms.  2 history of mildly elevated calcium  score-continue statin.  3 hypertension-blood pressure controlled.  Continue present medications.  4 history of palpitations-no recent symptoms.  Will consider beta-blocker in the future  if necessary.  5 morbid obesity-needs weight loss.  Redell Shallow, MD

## 2023-06-21 ENCOUNTER — Other Ambulatory Visit: Payer: Self-pay | Admitting: Sports Medicine

## 2023-06-21 DIAGNOSIS — M19012 Primary osteoarthritis, left shoulder: Secondary | ICD-10-CM

## 2023-07-01 ENCOUNTER — Encounter: Payer: Self-pay | Admitting: Family Medicine

## 2023-07-01 ENCOUNTER — Ambulatory Visit (INDEPENDENT_AMBULATORY_CARE_PROVIDER_SITE_OTHER): Payer: Medicare Other | Admitting: Family Medicine

## 2023-07-01 VITALS — BP 114/62 | HR 89

## 2023-07-01 DIAGNOSIS — M7989 Other specified soft tissue disorders: Secondary | ICD-10-CM

## 2023-07-01 DIAGNOSIS — M25512 Pain in left shoulder: Secondary | ICD-10-CM

## 2023-07-01 NOTE — Progress Notes (Signed)
   Acute Office Visit  Subjective:     Patient ID: Rachael Hancock, female    DOB: Jun 20, 1956, 68 y.o.   MRN: [id number]  Chief Complaint  Patient presents with   Cyst    HPI Patient is in today for lump on the top of her left shoulder.  She noticed it this week and about a week ago noticed a lump on her left anterior wrist.  She says that she does not member any specific injury or trauma she knows she has arthritis in that shoulder in fact she has an x-ray on file from March 2023.  She does not feel that the pain in her shoulder is different than her usual pain.  ROS      Objective:    BP 114/62   Pulse 89   SpO2 99%    Physical Exam Vitals reviewed.  Constitutional:      Appearance: Normal appearance.  HENT:     Head: Normocephalic.  Pulmonary:     Effort: Pulmonary effort is normal.  Musculoskeletal:     Comments: She has a nodule on the top of the left shoulder near the [facility] joint.  I do feel like it is very firm but possibly cystic.  Unclear if it subcutaneous as it is not mobile it could be connected to the joint space.  Neurological:     Mental Status: She is alert and oriented to person, place, and time.  Psychiatric:        Mood and Affect: Mood normal.        Behavior: Behavior normal.     No results found for any visits on 07/01/23.      Assessment & Plan:   Problem List Items Addressed This Visit       Other   Left shoulder pain - Primary   Relevant Orders   US  LT UPPER EXTREM LTD SOFT TISSUE NON VASCULAR   Other Visit Diagnoses       Mass of soft tissue of shoulder       Relevant Orders   US  LT UPPER EXTREM LTD SOFT TISSUE NON VASCULAR      Mass on top of left shoulder-consider synovial cyst versus ganglion cyst versus subcutaneous fat cyst.  Get miscellaneous ultrasound for further workup again she had an  x-ray done in March so not going to repeat that today.  Cyst on her left wrist is most consistent with a ganglion cyst we  discussed that these can be treated with aspiration but can also just be left alone often times they will get a little smaller on their own.  No orders of the defined types were placed in this encounter.   No follow-ups on file.  Dorothyann Byars, MD

## 2023-07-02 ENCOUNTER — Ambulatory Visit: Payer: Medicare Other

## 2023-07-02 ENCOUNTER — Ambulatory Visit: Payer: Medicare Other | Attending: Cardiology | Admitting: Cardiology

## 2023-07-02 ENCOUNTER — Encounter: Payer: Self-pay | Admitting: Cardiology

## 2023-07-02 VITALS — BP 118/72 | HR 89 | Ht 68.0 in | Wt 287.0 lb

## 2023-07-02 DIAGNOSIS — R002 Palpitations: Secondary | ICD-10-CM | POA: Diagnosis present

## 2023-07-02 DIAGNOSIS — R931 Abnormal findings on diagnostic imaging of heart and coronary circulation: Secondary | ICD-10-CM

## 2023-07-02 DIAGNOSIS — M25512 Pain in left shoulder: Secondary | ICD-10-CM | POA: Diagnosis not present

## 2023-07-02 DIAGNOSIS — M7989 Other specified soft tissue disorders: Secondary | ICD-10-CM

## 2023-07-02 DIAGNOSIS — R072 Precordial pain: Secondary | ICD-10-CM

## 2023-07-02 DIAGNOSIS — I10 Essential (primary) hypertension: Secondary | ICD-10-CM

## 2023-07-02 MED ORDER — AMLODIPINE BESYLATE 10 MG PO TABS: 10.0000 mg | ORAL_TABLET | Freq: Every day | ORAL | 3 refills | Status: DC

## 2023-07-02 NOTE — Patient Instructions (Signed)
   Follow-Up: At [facility], you and your health needs are our priority.  As part of our continuing mission to provide you with exceptional heart care, we have created designated Provider Care Teams.  These Care Teams include your primary Cardiologist (physician) and Advanced Practice Providers (APPs -  Physician Assistants and Nurse Practitioners) who all work together to provide you with the care you need, when you need it.  We recommend signing up for the patient portal called "MyChart".  Sign up information is provided on this After Visit Summary.  MyChart is used to connect with patients for Virtual Visits (Telemedicine).  Patients are able to view lab/test results, encounter notes, upcoming appointments, etc.  Non-urgent messages can be sent to your provider as well.   To learn more about what you can do with MyChart, go to [email or web address].    Your next appointment:   12 month(s)  Provider:   Olga Millers, MD

## 2023-07-07 ENCOUNTER — Other Ambulatory Visit: Payer: Self-pay | Admitting: Family Medicine

## 2023-07-11 NOTE — Progress Notes (Signed)
Hi [name], it does show a superficial complex cyst.  To try to see if I can get Dr. Karie Schwalbe to look at it to see what he thinks.  I will let you know.

## 2023-07-14 ENCOUNTER — Encounter: Payer: Self-pay | Admitting: Family Medicine

## 2023-07-14 ENCOUNTER — Telehealth: Payer: Self-pay | Admitting: Family Medicine

## 2023-07-14 ENCOUNTER — Ambulatory Visit (INDEPENDENT_AMBULATORY_CARE_PROVIDER_SITE_OTHER): Payer: Medicare Other | Admitting: Family Medicine

## 2023-07-14 VITALS — BP 125/76 | HR 99 | Ht 68.0 in | Wt 287.0 lb

## 2023-07-14 DIAGNOSIS — R7309 Other abnormal glucose: Secondary | ICD-10-CM

## 2023-07-14 DIAGNOSIS — G4733 Obstructive sleep apnea (adult) (pediatric): Secondary | ICD-10-CM | POA: Diagnosis not present

## 2023-07-14 DIAGNOSIS — I10 Essential (primary) hypertension: Secondary | ICD-10-CM | POA: Diagnosis not present

## 2023-07-14 DIAGNOSIS — M160 Bilateral primary osteoarthritis of hip: Secondary | ICD-10-CM

## 2023-07-14 DIAGNOSIS — Z6841 Body Mass Index (BMI) 40.0 and over, adult: Secondary | ICD-10-CM

## 2023-07-14 LAB — POCT GLYCOSYLATED HEMOGLOBIN (HGB A1C): Hemoglobin A1C: 5.4 % (ref 4.0–5.6)

## 2023-07-14 MED ORDER — TIRZEPATIDE-WEIGHT MANAGEMENT 2.5 MG/0.5ML ~~LOC~~ SOLN: 2.5000 mg | SUBCUTANEOUS | 0 refills | Status: DC

## 2023-07-14 NOTE — Telephone Encounter (Signed)
To see if we can get stepdown approved for diagnosis of BMI 44, sleep apnea, hypertension, primary OA of the hips needing surgery and weight loss reduction to be able to be a candidate for surgery, hyperlipidemia.

## 2023-07-14 NOTE — Progress Notes (Signed)
Established Patient Office Visit  Subjective  Patient ID: Rachael Hancock, female    DOB: [birth year]  Age: 68 y.o. MRN: [id number]  Chief Complaint  Patient presents with   Obesity    HPI  Here to discuss wt mgt, she needs to have hip surgery. She has bilat Hip OSA . She ic currently following with Wynona Luna at [facility] and is taking topamax and phentermine.  He has been successfully losing weight on this combination and in fact had lost 20 pounds since September.  She is continuing to work on diet and exercise.  But unfortunately her hips are continuing to become more painful and more bothersome it is really impacted her mobility and she is having a lot of pain at night so she really wants to try to get to her goal more quickly to be able to have surgery.  Her BMI is now down to 43 goal is to get down to under 40 if possible.  Rachael Hancock had significant difficulty even getting in the office today because of her hip pain.  We ended up having to get a wheelchair to assist her down the long haul.    ROS    Objective:     BP 125/76   Pulse 99   Ht 5\' 8"  (1.727 m)   Wt 287 lb (130.2 kg)   SpO2 97%   BMI 43.64 kg/m    Physical Exam Vitals reviewed.  Constitutional:      Appearance: Normal appearance.  HENT:     Head: Normocephalic.  Pulmonary:     Effort: Pulmonary effort is normal.  Neurological:     Mental Status: She is alert and oriented to person, place, and time.  Psychiatric:        Mood and Affect: Mood normal.        Behavior: Behavior normal.      Results for orders placed or performed in visit on 07/14/23  POCT HgB A1C  Result Value Ref Range   Hemoglobin A1C 5.4 4.0 - 5.6 %   HbA1c POC (<> result, manual entry)     HbA1c, POC (prediabetic range)     HbA1c, POC (controlled diabetic range)        The 10-year ASCVD risk score (Arnett DK, et al., 2019) is: 6.5%    Assessment & Plan:   Problem List Items Addressed This Visit        Cardiovascular and Mediastinum   Essential hypertension, benign   Relevant Medications   tirzepatide (ZEPBOUND) 2.5 MG/0.5ML injection vial     Respiratory   OSA (obstructive sleep apnea)   Sleep study on 10/17/11 with mild OSA, AHI of 5.2 and desat to 84%. CPAP set to 7. Followed at [facility].   She has several conditions that would benefit from significant weight reduction.  Like to move forward with seeing we can get a GLP-1 approved for her sleep apnea, hypertension, and arthritis of the hips.      Relevant Medications   tirzepatide (ZEPBOUND) 2.5 MG/0.5ML injection vial     Musculoskeletal and Integument   Primary osteoarthritis of both hips   Relevant Medications   tirzepatide (ZEPBOUND) 2.5 MG/0.5ML injection vial     Other   BMI 40.0-44.9, adult (HCC)   Relevant Medications   tirzepatide (ZEPBOUND) 2.5 MG/0.5ML injection vial   Other Visit Diagnoses       Abnormal glucose    -  Primary   Relevant Medications  tirzepatide (ZEPBOUND) 2.5 MG/0.5ML injection vial   Other Relevant Orders   POCT HgB A1C (Completed)       Like to see if we can get a GLP-1 covered for her with her current insurance.  She is though she really is doing great on a combination of Topamax and phentermine.  She has been making great progress though not as quickly as she would like to be able to get her surgery more quickly.  Goal would be to get her BMI down to 40.  No follow-ups on file.    Rachael Gasser, MD

## 2023-07-14 NOTE — Assessment & Plan Note (Signed)
Sleep study on 10/17/11 with mild OSA, AHI of 5.2 and desat to 84%. CPAP set to 7. Followed at [facility].   She has several conditions that would benefit from significant weight reduction.  Like to move forward with seeing we can get a GLP-1 approved for her sleep apnea, hypertension, and arthritis of the hips.

## 2023-07-15 ENCOUNTER — Encounter: Payer: Self-pay | Admitting: Family Medicine

## 2023-07-15 NOTE — Progress Notes (Signed)
HI Rachael Hancock, I spoke with Dr. Karie Schwalbe and he took a look at your images.  He feels like it is a cyst directly off of the joint space.  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  He says he feels pretty confident that he could drain it for you if you would like.  Just let us know when would be convenient.  I do not know if you want a wait until after your surgery or if you want to try to get that done before that certainly up to you.

## 2023-07-16 ENCOUNTER — Telehealth: Payer: Self-pay | Admitting: Family Medicine

## 2023-07-16 MED ORDER — MUPIROCIN 2 % EX OINT: TOPICAL_OINTMENT | Freq: Two times a day (BID) | CUTANEOUS | 0 refills | Status: DC

## 2023-07-16 NOTE — Addendum Note (Signed)
Addended by: [name] D on: 07/16/2023 08:10 AM   Modules accepted: Orders

## 2023-07-16 NOTE — Telephone Encounter (Signed)
Meds ordered this encounter  Medications   mupirocin ointment (BACTROBAN) 2 %    Sig: Apply topically 2 (two) times daily. To area on hand    Dispense:  15 g    Refill:  0

## 2023-07-16 NOTE — Telephone Encounter (Signed)
Copied from CRM [id number]. Topic: Clinical - Prescription Issue >> Jul 16, 2023  8:58 AM Nila Nephew wrote: Reason for CRM: Bactrim - Pharmacy needs clarification on the directions for this medication for the first ten days, as it was not clarified in the prescription request.  C/B at [phone] for [facility]

## 2023-07-16 NOTE — Addendum Note (Signed)
Addended by: [name] D on: 07/16/2023 02:07 PM   Modules accepted: Orders

## 2023-07-17 NOTE — Telephone Encounter (Signed)
Per pharmacy- bactroban rx has processed without issue She also asked for dx code for zepbound - gave  Z68.41

## 2023-07-18 NOTE — Telephone Encounter (Signed)
Zepbound

## 2023-07-22 ENCOUNTER — Other Ambulatory Visit: Payer: Self-pay | Admitting: Sports Medicine

## 2023-07-22 DIAGNOSIS — M19012 Primary osteoarthritis, left shoulder: Secondary | ICD-10-CM

## 2023-07-28 ENCOUNTER — Encounter: Payer: Self-pay | Admitting: Family Medicine

## 2023-07-28 ENCOUNTER — Ambulatory Visit (INDEPENDENT_AMBULATORY_CARE_PROVIDER_SITE_OTHER): Payer: Medicare Other | Admitting: Family Medicine

## 2023-07-28 VITALS — BP 133/61 | HR 81 | Ht 68.0 in | Wt 284.0 lb

## 2023-07-28 DIAGNOSIS — Z6841 Body Mass Index (BMI) 40.0 and over, adult: Secondary | ICD-10-CM

## 2023-07-28 DIAGNOSIS — R252 Cramp and spasm: Secondary | ICD-10-CM

## 2023-07-28 DIAGNOSIS — I10 Essential (primary) hypertension: Secondary | ICD-10-CM | POA: Diagnosis not present

## 2023-07-28 NOTE — Assessment & Plan Note (Signed)
BP borderline today. Will monitor.

## 2023-07-28 NOTE — Assessment & Plan Note (Signed)
Doing well on her current combination of phentermine and Topamax.  She is just disappointed in the rate of weight loss that she really wants to be able to get in and do her surgery sooner rather than later this year.  It really been very debilitating for her.

## 2023-07-28 NOTE — Patient Instructions (Signed)
1 month after you get weight loss medication

## 2023-07-28 NOTE — Progress Notes (Signed)
   Established Patient Office Visit  Subjective  Patient ID: Rachael Hancock, female    DOB: [birth year]  Age: 68 y.o. MRN: [id number]  Chief Complaint  Patient presents with   Weight Check    HPI Waiting to hear back from PA for Zepbound.   She is still on phentermine and Topamax.  She is lost another 3 pounds since she was last here about 2 weeks ago.  She is continuing to work on limiting portions and intake.  She is unable to exercise because of her joints.  Still needs to get down to a BMI of 40 for surgery which is about 255 pounds.  Pt c/o leg cramps - mostly in lower legs and feet. Eating mustard .  No recent changes to medications etc.  She does go barefoot a lot.     ROS    Objective:     BP 133/61   Pulse 81   Ht 5\' 8"  (1.727 m)   Wt 284 lb (128.8 kg)   SpO2 96%   BMI 43.18 kg/m    Physical Exam Vitals and nursing note reviewed.  Constitutional:      Appearance: Normal appearance.  HENT:     Head: Normocephalic and atraumatic.  Eyes:     Conjunctiva/sclera: Conjunctivae normal.  Cardiovascular:     Rate and Rhythm: Normal rate and regular rhythm.  Pulmonary:     Effort: Pulmonary effort is normal.     Breath sounds: Normal breath sounds.  Skin:    General: Skin is warm and dry.  Neurological:     Mental Status: She is alert.  Psychiatric:        Mood and Affect: Mood normal.      No results found for any visits on 07/28/23.    The 10-year ASCVD risk score (Arnett DK, et al., 2019) is: 7.4%    Assessment & Plan:   Problem List Items Addressed This Visit       Cardiovascular and Mediastinum   Essential hypertension, benign   BP borderline today. Will monitor.         Other   BMI 40.0-44.9, adult (HCC) - Primary   Doing well on her current combination of phentermine and Topamax.  She is just disappointed in the rate of weight loss that she really wants to be able to get in and do her surgery sooner rather than later this year.  It  really been very debilitating for her.      Relevant Orders   TSH   Magnesium   Basic Metabolic Panel (BMET)   Other Visit Diagnoses       Leg cramps       Relevant Orders   TSH   Magnesium   Basic Metabolic Panel (BMET)       Leg Cramps-we will check for deficiencies.  Will check for thyroid abnormality.  Make sure hydrating well.  Avoid going barefoot and wear good supportive footwear throughout the day.  No follow-ups on file.    [name], MD

## 2023-07-29 LAB — BASIC METABOLIC PANEL
BUN/Creatinine Ratio: 24 (ref 12–28)
BUN: 18 mg/dL (ref 8–27)
CO2: 22 mmol/L (ref 20–29)
Calcium: 9.4 mg/dL (ref 8.7–10.3)
Chloride: 104 mmol/L (ref 96–106)
Creatinine, Ser: 0.76 mg/dL (ref 0.57–1.00)
Glucose: 94 mg/dL (ref 70–99)
Potassium: 3.3 mmol/L — ABNORMAL LOW (ref 3.5–5.2)
Sodium: 142 mmol/L (ref 134–144)
eGFR: 86 mL/min/{1.73_m2} (ref >59)

## 2023-07-29 LAB — MAGNESIUM: Magnesium: 1.8 mg/dL (ref 1.6–2.3)

## 2023-07-29 LAB — TSH: TSH: 5.05 u[IU]/mL — ABNORMAL HIGH (ref 0.450–4.500)

## 2023-07-30 ENCOUNTER — Encounter: Payer: Self-pay | Admitting: Family Medicine

## 2023-07-31 ENCOUNTER — Telehealth: Payer: Self-pay

## 2023-07-31 NOTE — Telephone Encounter (Addendum)
 Prior auth for: ZEPBOUND  2.5 MG Determination: APPROVED Auth #: [id number] Valid from: 07/31/23 - 06/23/24

## 2023-08-02 ENCOUNTER — Encounter: Payer: Self-pay | Admitting: Family Medicine

## 2023-08-04 NOTE — Telephone Encounter (Signed)
 Prior auth for: ZEPBOUND  2.5 MG Determination: APPROVED Auth #: [id number] Valid from: 07/31/23 - 06/23/24

## 2023-08-07 ENCOUNTER — Other Ambulatory Visit: Payer: Self-pay | Admitting: Family Medicine

## 2023-08-07 DIAGNOSIS — I10 Essential (primary) hypertension: Secondary | ICD-10-CM

## 2023-08-07 DIAGNOSIS — R600 Localized edema: Secondary | ICD-10-CM

## 2023-08-18 ENCOUNTER — Other Ambulatory Visit: Payer: Self-pay | Admitting: Sports Medicine

## 2023-08-18 DIAGNOSIS — M19012 Primary osteoarthritis, left shoulder: Secondary | ICD-10-CM

## 2023-08-22 ENCOUNTER — Ambulatory Visit (INDEPENDENT_AMBULATORY_CARE_PROVIDER_SITE_OTHER): Payer: Medicare Other | Admitting: Family Medicine

## 2023-08-22 ENCOUNTER — Encounter: Payer: Self-pay | Admitting: Family Medicine

## 2023-08-22 VITALS — BP 129/75 | HR 84 | Ht 68.0 in | Wt 273.0 lb

## 2023-08-22 DIAGNOSIS — J019 Acute sinusitis, unspecified: Secondary | ICD-10-CM

## 2023-08-22 DIAGNOSIS — N3000 Acute cystitis without hematuria: Secondary | ICD-10-CM | POA: Diagnosis not present

## 2023-08-22 DIAGNOSIS — H9202 Otalgia, left ear: Secondary | ICD-10-CM | POA: Diagnosis not present

## 2023-08-22 DIAGNOSIS — R8290 Unspecified abnormal findings in urine: Secondary | ICD-10-CM | POA: Diagnosis not present

## 2023-08-22 LAB — POCT URINALYSIS DIP (CLINITEK)
Glucose, UA: 100 mg/dL — AB
Nitrite, UA: NEGATIVE
POC PROTEIN,UA: 100 — AB
Spec Grav, UA: 1.025 (ref 1.010–1.025)
Urobilinogen, UA: 1 U/dL
pH, UA: 5.5 (ref 5.0–8.0)

## 2023-08-22 MED ORDER — AMOXICILLIN-POT CLAVULANATE 875-125 MG PO TABS
1.0000 | ORAL_TABLET | Freq: Two times a day (BID) | ORAL | 0 refills | Status: DC
Start: 2023-08-22 — End: 2023-09-30

## 2023-08-22 NOTE — Progress Notes (Signed)
 Acute Office Visit  Subjective:     Patient ID: Rachael Hancock, female    DOB: May 03, 1956, 68 y.o.   MRN: [id number]  Chief Complaint  Patient presents with   Ear Pain    L ear     HPI Patient is in today for Left ear pain and sinus congestion.  Started 5 days ago.       She reports that this started on Tuesday the pain is feeling like its in her teeth and down the back of her ear down into her neck. She has been using a heating pad for relief     No fever, chills or ear drainage. No hearing loss. The area behind her ear felt swollen.    She has also noticed a fishy odor when she goes to the bathroom.  No change in vaginal d/c. No dysuria or blood   ROS      Objective:    BP 129/75   Pulse 84   Ht 5\' 8"  (1.727 m)   Wt 273 lb (123.8 kg)   SpO2 97%   BMI 41.51 kg/m    Physical Exam Constitutional:      Appearance: Normal appearance.  HENT:     Head: Normocephalic and atraumatic.     Right Ear: Tympanic membrane, ear canal and external ear normal. There is no impacted cerumen.     Left Ear: Tympanic membrane, ear canal and external ear normal. There is no impacted cerumen.     Nose: Nose normal.     Mouth/Throat:     Pharynx: Oropharynx is clear.  Eyes:     Conjunctiva/sclera: Conjunctivae normal.  Cardiovascular:     Rate and Rhythm: Normal rate and regular rhythm.  Pulmonary:     Effort: Pulmonary effort is normal.     Breath sounds: Normal breath sounds.  Musculoskeletal:     Cervical back: Neck supple. No tenderness.  Lymphadenopathy:     Cervical: No cervical adenopathy.  Skin:    General: Skin is warm and dry.  Neurological:     Mental Status: She is alert and oriented to person, place, and time.  Psychiatric:        Mood and Affect: Mood normal.     Results for orders placed or performed in visit on 08/22/23  POCT URINALYSIS DIP (CLINITEK)  Result Value Ref Range   Color, UA yellow yellow   Clarity, UA cloudy (A) clear   Glucose,  UA =100 (A) negative mg/dL   Bilirubin, UA moderate (A) negative   Ketones, POC UA trace (5) (A) negative mg/dL   Spec Grav, UA 5.621 3.086 - 1.025   Blood, UA small (A) negative   pH, UA 5.5 5.0 - 8.0   POC PROTEIN,UA =100 (A) negative, trace   Urobilinogen, UA 1.0 0.2 or 1.0 E.U./dL   Nitrite, UA Negative Negative   Leukocytes, UA Moderate (2+) (A) Negative        Assessment & Plan:   Problem List Items Addressed This Visit   None Visit Diagnoses       Left ear pain    -  Primary     Abnormal urine odor       Relevant Orders   POCT URINALYSIS DIP (CLINITEK) (Completed)   WET PREP FOR TRICH, YEAST, CLUE   Urine Culture     Acute sinusitis, recurrence not specified, unspecified location       Relevant Medications   amoxicillin-clavulanate (AUGMENTIN) 875-125 MG tablet  Acute cystitis without hematuria       Relevant Medications   amoxicillin-clavulanate (AUGMENTIN) 875-125 MG tablet      Left ear pain-exam is normal and her pain is somewhat better today so encouraged her to give it through the weekend.  It could be that she truly had some inflammation from a virus or could be related from pressure in her sinus cavities causing the pain.  If not continuing to get better over the weekend then we will treat for arterial sinusitis.  Wet prep and urinalysis obtained.  Urinalysis showed cloudy urine with glucose, moderate bilirubin, small blood and moderate leukocytes as well as 100 mg of protein.  Sent for culture for further workup.  Going to go ahead and place her on macrobid.   Meds ordered this encounter  Medications   amoxicillin-clavulanate (AUGMENTIN) 875-125 MG tablet    Sig: Take 1 tablet by mouth 2 (two) times daily.    Dispense:  10 tablet    Refill:  0    Return if symptoms worsen or fail to improve.  [name], MD

## 2023-08-22 NOTE — Progress Notes (Signed)
 She reports that this started on Tuesday the pain is feeling like its in her teeth and down the back of her ear down into her neck. She has been using a heating pad for relief

## 2023-08-23 LAB — WET PREP FOR TRICH, YEAST, CLUE
Clue Cell Exam: NEGATIVE
Trichomonas Exam: NEGATIVE
Yeast Exam: POSITIVE — AB

## 2023-08-25 ENCOUNTER — Encounter: Payer: Self-pay | Admitting: Family Medicine

## 2023-08-25 LAB — URINE CULTURE

## 2023-08-25 MED ORDER — FLUCONAZOLE 150 MG PO TABS: 150.0000 mg | ORAL_TABLET | Freq: Once | ORAL | 0 refills | Status: AC

## 2023-08-25 NOTE — Addendum Note (Signed)
 Addended by: [name] D on: 08/25/2023 05:09 PM   Modules accepted: Orders

## 2023-08-25 NOTE — Progress Notes (Signed)
 Urine culture came back positive for E. coli.  I sent in a prescription over the weekend for Augmentin that should actually take care of the infection.  You are also had some excess yeast some going to send over Diflucan to clear that up as well.  Hopefully you are starting to feel better.

## 2023-08-28 ENCOUNTER — Other Ambulatory Visit: Payer: Self-pay | Admitting: Cardiology

## 2023-08-28 DIAGNOSIS — R931 Abnormal findings on diagnostic imaging of heart and coronary circulation: Secondary | ICD-10-CM

## 2023-09-08 LAB — HM COLONOSCOPY

## 2023-09-11 ENCOUNTER — Other Ambulatory Visit: Payer: Self-pay | Admitting: Family Medicine

## 2023-09-11 DIAGNOSIS — G4733 Obstructive sleep apnea (adult) (pediatric): Secondary | ICD-10-CM

## 2023-09-11 DIAGNOSIS — R7309 Other abnormal glucose: Secondary | ICD-10-CM

## 2023-09-11 DIAGNOSIS — M160 Bilateral primary osteoarthritis of hip: Secondary | ICD-10-CM

## 2023-09-11 DIAGNOSIS — Z6841 Body Mass Index (BMI) 40.0 and over, adult: Secondary | ICD-10-CM

## 2023-09-11 DIAGNOSIS — I10 Essential (primary) hypertension: Secondary | ICD-10-CM

## 2023-09-12 MED ORDER — ZEPBOUND 5 MG/0.5ML ~~LOC~~ SOAJ: 5.0000 mg | SUBCUTANEOUS | 1 refills | Status: DC

## 2023-09-12 NOTE — Telephone Encounter (Signed)
 See if she wants to go up to the next strength on the medication

## 2023-09-16 ENCOUNTER — Other Ambulatory Visit: Payer: Self-pay | Admitting: Sports Medicine

## 2023-09-16 ENCOUNTER — Other Ambulatory Visit: Payer: Self-pay | Admitting: Family Medicine

## 2023-09-16 DIAGNOSIS — I10 Essential (primary) hypertension: Secondary | ICD-10-CM

## 2023-09-16 DIAGNOSIS — R600 Localized edema: Secondary | ICD-10-CM

## 2023-09-16 DIAGNOSIS — M19012 Primary osteoarthritis, left shoulder: Secondary | ICD-10-CM

## 2023-09-17 ENCOUNTER — Other Ambulatory Visit (HOSPITAL_COMMUNITY): Payer: Self-pay

## 2023-09-30 ENCOUNTER — Encounter: Payer: Self-pay | Admitting: Family Medicine

## 2023-09-30 ENCOUNTER — Ambulatory Visit (INDEPENDENT_AMBULATORY_CARE_PROVIDER_SITE_OTHER): Admitting: Family Medicine

## 2023-09-30 VITALS — BP 113/58 | HR 83 | Ht 68.0 in | Wt 262.0 lb

## 2023-09-30 DIAGNOSIS — G43109 Migraine with aura, not intractable, without status migrainosus: Secondary | ICD-10-CM | POA: Diagnosis not present

## 2023-09-30 DIAGNOSIS — R0981 Nasal congestion: Secondary | ICD-10-CM

## 2023-09-30 DIAGNOSIS — G43901 Migraine, unspecified, not intractable, with status migrainosus: Secondary | ICD-10-CM | POA: Diagnosis not present

## 2023-09-30 MED ORDER — RIZATRIPTAN BENZOATE 10 MG PO TBDP: 10.0000 mg | ORAL_TABLET | ORAL | 3 refills | Status: AC | PRN

## 2023-09-30 NOTE — Patient Instructions (Signed)
 Call if sinuses aren't better in a couple of days.

## 2023-09-30 NOTE — Progress Notes (Signed)
 Acute Office Visit  Subjective:     Patient ID: Rachael Hancock, female    DOB: Jul 19, 1955, 68 y.o.   MRN: [id number]  Chief Complaint  Patient presents with   Headache    HPI Patient is in today for Headaches she says that unfortunately for the last 4 days she has been getting an aura and then a migraine.  She said she has had more severe migraines in the past but is just the fact that it is relentless.  She can usually lay down and get little bit of relief she is mostly just relying on Tylenol she says it has been quite a while since she has had a bad migraine.  She is not sure what may have triggered at this time maybe the weather change.  She is also had a lot of sinus pressure and some mild can congestion.  In fact she was seen at an urgent care and they felt like it was most consistent with allergies.  She has been taking an antihistamine and using her nasal spray.  ROS      Objective:    BP (!) 113/58   Pulse 83   Ht 5\' 8"  (1.727 m)   Wt 262 lb (118.8 kg)   SpO2 100%   BMI 39.84 kg/m    Physical Exam Constitutional:      Appearance: Normal appearance.  HENT:     Head: Normocephalic and atraumatic.     Right Ear: Tympanic membrane, ear canal and external ear normal. There is no impacted cerumen.     Left Ear: Tympanic membrane, ear canal and external ear normal. There is no impacted cerumen.     Nose: Nose normal.     Mouth/Throat:     Pharynx: Oropharynx is clear.  Eyes:     Conjunctiva/sclera: Conjunctivae normal.  Cardiovascular:     Rate and Rhythm: Normal rate and regular rhythm.  Pulmonary:     Effort: Pulmonary effort is normal.     Breath sounds: Normal breath sounds.  Musculoskeletal:     Cervical back: Neck supple. No tenderness.  Lymphadenopathy:     Cervical: No cervical adenopathy.  Skin:    General: Skin is warm and dry.  Neurological:     Mental Status: She is alert and oriented to person, place, and time.  Psychiatric:        Mood  and Affect: Mood normal.     Results for orders placed or performed in visit on 09/30/23  HM COLONOSCOPY  Result Value Ref Range   HM Colonoscopy See Report (in chart) See Report (in chart), Patient Reported        Assessment & Plan:   Problem List Items Addressed This Visit       Cardiovascular and Mediastinum   Migraine with aura - Primary   Given Toradol and Phenergan injection here today for acute pain relief her husband is driving her.  We did discuss doing a trial of a triptan so we will send over Maxalt melts for her to try next time she gets a migraine or a headache.  This can be taken in combination with the Tylenol if needed or if she decides to take the Tylenol first and does not get relief she can take the Maxalt.  Will keep an eye on how frequent these become hopefully to be a long time before she has another headache.      Relevant Medications   rizatriptan (MAXALT-MLT) 10 MG disintegrating  tablet   Other Visit Diagnoses       Nasal congestion         Status migrainosus       Relevant Medications   rizatriptan (MAXALT-MLT) 10 MG disintegrating tablet      Sinus pressure-recommend a trial of nasal saline irrigation given a sample bottle to try and use on her own at home if not improving over the next couple days consider treatment for bacterial sinusitis.   Meds ordered this encounter  Medications   rizatriptan (MAXALT-MLT) 10 MG disintegrating tablet    Sig: Take 1 tablet (10 mg total) by mouth as needed for migraine. May repeat in 2 hours if needed    Dispense:  10 tablet    Refill:  3    No follow-ups on file.  [name], MD

## 2023-09-30 NOTE — Assessment & Plan Note (Signed)
 Given Toradol and Phenergan injection here today for acute pain relief her husband is driving her.  We did discuss doing a trial of a triptan so we will send over Maxalt melts for her to try next time she gets a migraine or a headache.  This can be taken in combination with the Tylenol if needed or if she decides to take the Tylenol first and does not get relief she can take the Maxalt.  Will keep an eye on how frequent these become hopefully to be a long time before she has another headache.

## 2023-09-30 NOTE — Progress Notes (Signed)
 Pt reports that she has been experiencing Aura's for the past 4 days. She also c/o some sinus pressure

## 2023-10-15 ENCOUNTER — Encounter: Payer: Self-pay | Admitting: Family Medicine

## 2023-10-15 DIAGNOSIS — J019 Acute sinusitis, unspecified: Secondary | ICD-10-CM

## 2023-10-17 ENCOUNTER — Ambulatory Visit: Admitting: Family Medicine

## 2023-10-17 ENCOUNTER — Encounter: Payer: Self-pay | Admitting: Family Medicine

## 2023-10-17 ENCOUNTER — Ambulatory Visit: Payer: Self-pay | Admitting: *Deleted

## 2023-10-17 VITALS — BP 119/74 | HR 74 | Ht 68.0 in | Wt 253.0 lb

## 2023-10-17 DIAGNOSIS — J019 Acute sinusitis, unspecified: Secondary | ICD-10-CM | POA: Diagnosis not present

## 2023-10-17 DIAGNOSIS — Z7689 Persons encountering health services in other specified circumstances: Secondary | ICD-10-CM

## 2023-10-17 MED ORDER — ZEPBOUND 7.5 MG/0.5ML ~~LOC~~ SOAJ: 7.5000 mg | SUBCUTANEOUS | 0 refills | Status: DC

## 2023-10-17 MED ORDER — AMOXICILLIN-POT CLAVULANATE 875-125 MG PO TABS: 1.0000 | ORAL_TABLET | Freq: Two times a day (BID) | ORAL | 0 refills | Status: DC

## 2023-10-17 NOTE — Telephone Encounter (Signed)
  Chief Complaint: sinus drainage Symptoms: sinus symptoms- thick sinus drainage in throat, frequent migraines  Frequency: 1 month Pertinent Negatives: Patient denies fever, pain Disposition: [] ED /[] Urgent Care (no appt availability in office) / [x] Appointment(In office/virtual)/ []  [facility]/ [] Home Care/ [] Refused Recommended Disposition /[] [facility]/ []  Follow-up with PCP  Copied from CRM [id number]. Topic: Clinical - Red Word Triage >> Oct 17, 2023  9:38 AM Retta Caster wrote: Red Word that prompted transfer to Nurse Triage: Sinus Judythe Nurse congestions/Mucus heavy  worse 1-2 days Reason for Disposition  [1] Nasal discharge AND [2] present > 10 days  Answer Assessment - Initial Assessment Questions 1. LOCATION: "Where does it hurt?"      Sometimes soreness in face- patient did have back to back migraines- 2 since them  2. ONSET: "When did the sinus pain start?"  (e.g., hours, days)      Over 1 month 3. SEVERITY: "How bad is the pain?"   (Scale 1-10; mild, moderate or severe)   - MILD (1-3): doesn't interfere with normal activities    - MODERATE (4-7): interferes with normal activities (e.g., work or school) or awakens from sleep   - SEVERE (8-10): excruciating pain and patient unable to do any normal activities        Scratchy throat- dryness, patient has been using sinus rinse- nose feels clear- back of throat is clear- thick mucus, taste bad. 4. RECURRENT SYMPTOM: "Have you ever had sinus problems before?" If Yes, ask: "When was the last time?" and "What happened that time?"      Yes- allergy issues always- this is hanging on - not clearing 5. NASAL CONGESTION: "Is the nose blocked?" If Yes, ask: "Can you open it or must you breathe through your mouth?"     Nasal passage will clog- patient states nasal rinse is helping- more sinus drainage then anything 6. NASAL DISCHARGE: "Do you have discharge from your nose?" If so ask, "What color?"     clear 7. FEVER: "Do you  have a fever?" If Yes, ask: "What is it, how was it measured, and when did it start?"      no 8. OTHER SYMPTOMS: "Do you have any other symptoms?" (e.g., sore throat, cough, earache, difficulty breathing)     Migraine, heavy mucus in throat- hard to clear  Protocols used: Sinus Pain or Congestion-A-AH

## 2023-10-17 NOTE — Assessment & Plan Note (Addendum)
 Visit #:2 Starting Weight: 273 lbs   Current weight: 253 lbs  lbs  Previous weight: 262 lbs  Change in weight: down 9 lbs  Goal weight: 240 lbs  Dietary goals: Continue to work on high-protein diet and low carb intake. Exercise goals: Encouraged her to do some light weights and band exercises and even if she is sitting to try to do some chair exercises with her legs. Medication: will increase Zepbound  to 7.5 mg in 3 weeks. Follow-up and referrals: 8 weeks

## 2023-10-17 NOTE — Progress Notes (Signed)
 Acute Office Visit  Subjective:     Patient ID: Rachael Hancock, female    DOB: 08-25-1955, 68 y.o.   MRN: [id number]  Chief Complaint  Patient presents with   Sinusitis    HPI Patient is in today for sinus sxs.  Still having thick nasal drainage and mucus.  I seen her a couple of weeks ago and she had started having some symptoms at that point in time we had recommended irrigation hydration and humidifier etc.  She was taking some allergy medications.  She is here today because she is not feeling better. Sinus rinses are hurting. Throat is scratchy yin the AM but better in the afternoon.   Follow-up weight management-doing really well on tirzepatide  5mg  she really has not had a lot of nausea it has helped her decrease her appetite she is working pretty diligently to keep her protein intake up but she has not been very physically active.  She is taking an extra fiber to keep her bowels moving and that seems to be working well.  ROS      Objective:    BP 119/74 (BP Location: Left Arm, Cuff Size: Large)   Pulse 74   Ht 5\' 8"  (1.727 m)   Wt 253 lb (114.8 kg)   SpO2 99%   BMI 38.47 kg/m    Physical Exam Constitutional:      Appearance: Normal appearance.  HENT:     Head: Normocephalic and atraumatic.     Right Ear: Tympanic membrane, ear canal and external ear normal. There is no impacted cerumen.     Left Ear: Tympanic membrane, ear canal and external ear normal. There is no impacted cerumen.     Nose: Nose normal.     Mouth/Throat:     Pharynx: Oropharynx is clear.  Eyes:     Conjunctiva/sclera: Conjunctivae normal.  Cardiovascular:     Rate and Rhythm: Normal rate and regular rhythm.  Pulmonary:     Effort: Pulmonary effort is normal.     Breath sounds: Normal breath sounds.  Musculoskeletal:     Cervical back: Neck supple. No tenderness.  Lymphadenopathy:     Cervical: No cervical adenopathy.  Skin:    General: Skin is warm and dry.  Neurological:      Mental Status: She is alert and oriented to person, place, and time.  Psychiatric:        Mood and Affect: Mood normal.     No results found for any visits on 10/17/23.      Assessment & Plan:   Problem List Items Addressed This Visit       Other   Encounter for weight management - Primary   Visit #:2 Starting Weight: 273 lbs   Current weight: 253 lbs  lbs  Previous weight: 262 lbs  Change in weight: down 9 lbs  Goal weight: 240 lbs  Dietary goals: Continue to work on high-protein diet and low carb intake. Exercise goals: Encouraged her to do some light weights and band exercises and even if she is sitting to try to do some chair exercises with her legs. Medication: will increase Zepbound  to 7.5 mg in 3 weeks. Follow-up and referrals: 8 weeks        Relevant Medications   tirzepatide  (ZEPBOUND ) 7.5 MG/0.5ML Pen   Other Visit Diagnoses       Acute non-recurrent sinusitis, unspecified location       Relevant Medications   amoxicillin -clavulanate (AUGMENTIN ) 875-125 MG tablet  Meds ordered this encounter  Medications   amoxicillin -clavulanate (AUGMENTIN ) 875-125 MG tablet    Sig: Take 1 tablet by mouth 2 (two) times daily.    Dispense:  14 tablet    Refill:  0   tirzepatide  (ZEPBOUND ) 7.5 MG/0.5ML Pen    Sig: Inject 7.5 mg into the skin once a week.    Dispense:  2 mL    Refill:  0    Return in about 8 weeks (around 12/12/2023) for weight management .  Duaine German, MD

## 2023-10-21 MED ORDER — DOXYCYCLINE HYCLATE 100 MG PO TABS: 100.0000 mg | ORAL_TABLET | Freq: Two times a day (BID) | ORAL | 0 refills | Status: DC

## 2023-10-21 NOTE — Telephone Encounter (Signed)
 Meds ordered this encounter  Medications   doxycycline  (VIBRA -TABS) 100 MG tablet    Sig: Take 1 tablet (100 mg total) by mouth 2 (two) times daily.    Dispense:  14 tablet    Refill:  0    No red dye

## 2023-10-27 NOTE — Telephone Encounter (Signed)
Orders Placed This Encounter  Procedures   Ambulatory referral to ENT    Referral Priority:   Routine    Referral Type:   Consultation    Referral Reason:   Specialty Services Required    Requested Specialty:   Otolaryngology    Number of Visits Requested:   1    

## 2023-10-27 NOTE — Addendum Note (Signed)
 Addended by: Antonino Nienhuis D on: 10/27/2023 05:38 PM   Modules accepted: Orders

## 2023-11-07 ENCOUNTER — Encounter: Payer: Self-pay | Admitting: Family Medicine

## 2023-11-07 ENCOUNTER — Other Ambulatory Visit: Payer: Self-pay | Admitting: Family Medicine

## 2023-11-07 DIAGNOSIS — Z7689 Persons encountering health services in other specified circumstances: Secondary | ICD-10-CM

## 2023-11-07 MED ORDER — ZEPBOUND 7.5 MG/0.5ML ~~LOC~~ SOAJ: 7.5000 mg | SUBCUTANEOUS | 0 refills | Status: DC

## 2023-11-07 MED ORDER — ZEPBOUND 10 MG/0.5ML ~~LOC~~ SOAJ: 10.0000 mg | SUBCUTANEOUS | 1 refills | Status: DC

## 2023-11-07 NOTE — Addendum Note (Signed)
 Addended by: Doretha Ganja on: 11/07/2023 11:05 AM   Modules accepted: Orders

## 2023-11-07 NOTE — Addendum Note (Signed)
 Addended by: Arneda Sappington D on: 11/07/2023 11:29 AM   Modules accepted: Orders

## 2023-12-03 LAB — HM MAMMOGRAPHY

## 2023-12-04 ENCOUNTER — Telehealth: Payer: Self-pay | Admitting: *Deleted

## 2023-12-04 ENCOUNTER — Telehealth: Payer: Self-pay | Admitting: Cardiology

## 2023-12-04 NOTE — Telephone Encounter (Signed)
 Primary Cardiologist:Brian Audery Blazing, MD   Preoperative team, please contact this patient and set up a phone call appointment for further preoperative risk assessment. Please obtain consent and complete medication review. Thank you for your help.   I confirm that guidance regarding antiplatelet and oral anticoagulation therapy has been completed and, if necessary, noted below (none requested).  I also confirmed the patient resides in the state of [place] . As per [facility] Medical Board telemedicine laws, the patient must reside in the state in which the provider is licensed.   Gerldine Koch, NP-C  12/04/2023, 12:07 PM [address], [address] [place], [place] [address] Office [phone] Fax [phone]

## 2023-12-04 NOTE — Telephone Encounter (Signed)
Left message for the pt to call back and schedule a tele pre op appt.  

## 2023-12-04 NOTE — Telephone Encounter (Signed)
   Pre-operative Risk Assessment    Patient Name: Rachael Hancock  DOB: 1956/04/09 MRN: [id number]   Date of last office visit: 07/02/23 Date of next office visit: Not yet scheduled   Request for Surgical Clearance    Procedure:  Right total hip replacement  Date of Surgery:  Clearance TBD                                Surgeon:  Dr. Neil Balls Surgeon's Group or Practice Name:  [facility] Phone number:  [phone]  Fax number:  [phone]   Type of Clearance Requested:   - Medical  - Pharmacy:  Hold        Type of Anesthesia:  Spinal   Additional requests/questions:     SignedChrystie Crass   12/04/2023, 9:58 AM

## 2023-12-04 NOTE — Telephone Encounter (Signed)
 Pt has been scheduled tele preop appt 12/11/23 as surgeon waiting on clearance before scheduling surgery. Med rec and consent are done.

## 2023-12-04 NOTE — Telephone Encounter (Signed)
 Pt has been scheduled tele preop appt 12/11/23 as surgeon waiting on clearance before scheduling surgery. Med rec and consent are done.      Patient Consent for Virtual Visit        Rachael Hancock has provided verbal consent on 12/04/2023 for a virtual visit (video or telephone).   CONSENT FOR VIRTUAL VISIT FOR:  Rachael Hancock  By participating in this virtual visit I agree to the following:  I hereby voluntarily request, consent and authorize [facility] and its employed or contracted physicians, physician assistants, nurse practitioners or other licensed health care professionals (the Practitioner), to provide me with telemedicine health care services (the "Services) as deemed necessary by the treating Practitioner. I acknowledge and consent to receive the Services by the Practitioner via telemedicine. I understand that the telemedicine visit will involve communicating with the Practitioner through live audiovisual communication technology and the disclosure of certain medical information by electronic transmission. I acknowledge that I have been given the opportunity to request an in-person assessment or other available alternative prior to the telemedicine visit and am voluntarily participating in the telemedicine visit.  I understand that I have the right to withhold or withdraw my consent to the use of telemedicine in the course of my care at any time, without affecting my right to future care or treatment, and that the Practitioner or I may terminate the telemedicine visit at any time. I understand that I have the right to inspect all information obtained and/or recorded in the course of the telemedicine visit and may receive copies of available information for a reasonable fee.  I understand that some of the potential risks of receiving the Services via telemedicine include:  Delay or interruption in medical evaluation due to technological equipment failure or  disruption; Information transmitted may not be sufficient (e.g. poor resolution of images) to allow for appropriate medical decision making by the Practitioner; and/or  In rare instances, security protocols could fail, causing a breach of personal health information.  Furthermore, I acknowledge that it is my responsibility to provide information about my medical history, conditions and care that is complete and accurate to the best of my ability. I acknowledge that Practitioner's advice, recommendations, and/or decision may be based on factors not within their control, such as incomplete or inaccurate data provided by me or distortions of diagnostic images or specimens that may result from electronic transmissions. I understand that the practice of medicine is not an exact science and that Practitioner makes no warranties or guarantees regarding treatment outcomes. I acknowledge that a copy of this consent can be made available to me via my patient portal [facility] MyChart), or I can request a printed copy by calling the office of [facility].    I understand that my insurance will be billed for this visit.   I have read or had this consent read to me. I understand the contents of this consent, which adequately explains the benefits and risks of the Services being provided via telemedicine.  I have been provided ample opportunity to ask questions regarding this consent and the Services and have had my questions answered to my satisfaction. I give my informed consent for the services to be provided through the use of telemedicine in my medical care

## 2023-12-05 ENCOUNTER — Telehealth: Payer: Self-pay | Admitting: Family Medicine

## 2023-12-05 MED ORDER — HYDROCODONE-ACETAMINOPHEN 5-325 MG PO TABS: 1.0000 | ORAL_TABLET | Freq: Four times a day (QID) | ORAL | 0 refills | Status: DC | PRN

## 2023-12-05 NOTE — Telephone Encounter (Signed)
 Patient returned my call. Her weight is 238.1. She would also like to know which medication does she need to stop, a week before her surgery? She also would like for you to send something a little stronger than Tramadol  to her pharmacy.

## 2023-12-05 NOTE — Telephone Encounter (Signed)
 She is kicking butt on the weight loss!!!!!  That is awesome!!   She will need to stop her diclofenac  and any aleve  or Ibuprofen at least 5 days before surgery.  No aspirin  on supplements either.  She will need to skip her Zepboud dose that week before as well. Can restart after surgery but they recommend holding the week before surgery.  If her surgery is on Wed for example and she give her shot on Sunday, then she would just skip the Sunday before surgery    Meds ordered this encounter  Medications   HYDROcodone -acetaminophen  (NORCO/VICODIN) 5-325 MG tablet    Sig: Take 1 tablet by mouth every 6 (six) hours as needed for moderate pain (pain score 4-6).    Dispense:  15 tablet    Refill:  0

## 2023-12-05 NOTE — Telephone Encounter (Signed)
 Patient informed.

## 2023-12-05 NOTE — Telephone Encounter (Signed)
 Please call patient: I know they are getting ready to schedule her surgery in the next month.  We received paperwork to complete on RNs and I know cardiology is gena get her scheduled for her preop for them.  If she could do me a favor and give us  her updated weight that would be wonderful.  Not sure if she is weight at home.  Also her husband was here the other day and let us  know that she is in a lot of pain.  I know she has used tramadol  before which she like a refill or does she feel like she might need something a little stronger?  Especially since she will need to stop all anti-inflammatories a week before her surgery.

## 2023-12-09 ENCOUNTER — Ambulatory Visit: Payer: Self-pay

## 2023-12-09 ENCOUNTER — Encounter: Payer: Self-pay | Admitting: Family Medicine

## 2023-12-09 NOTE — Telephone Encounter (Signed)
 Copied from CRM [id number]. Topic: Clinical - Medication Question >> Dec 09, 2023 12:26 PM Karole Pacer C wrote: Reason for CRM:  Alston Jerry from [facility] would like a call back at [phone]  to discuss a few questions they have regarding the patients medication: HYDROcodone -acetaminophen  (NORCO/VICODIN) 5-325 MG tablet

## 2023-12-09 NOTE — Telephone Encounter (Signed)
 Called pt's pharmacy about the hydrocodone -acetaminophen . Katie wanted to know if pt will be switching from tramadol  to hydrocodone -acetaminophen  as they had an Rx ready for that. Told Katie to put back the tramadol  and only fill the hydrocodone -acetaminophen  as pt was requesting a different med. Katie verbalized understanding. Nothing further needed.

## 2023-12-10 NOTE — Progress Notes (Unsigned)
 Virtual Visit via Telephone Note   Because of Rachael Hancock co-morbid illnesses, she is at least at moderate risk for complications without adequate follow up.  This format is felt to be most appropriate for this patient at this time.  Due to technical limitations with video connection (technology), today's appointment will be conducted as an audio only telehealth visit, and Rachael Hancock verbally agreed to proceed in this manner.   All issues noted in this document were discussed and addressed.  No physical exam could be performed with this format.  Evaluation Performed:  Preoperative cardiovascular risk assessment _____________   Date:  12/10/2023   Patient ID:  Rachael Hancock, DOB 21-Sep-1955, MRN [id number] Patient Location:  Home Provider location:   Office  Primary Care Provider:  Cydney Draft, MD Primary Cardiologist:  Rachael Angel, MD  Chief Complaint / Patient Profile   68 y.o. y/o female with a h/o hypertension, aortic atherosclerosis, palpitations, elevated coronary calcium  score who is pending right total hip arthroplasty and presents today for telephonic preoperative cardiovascular risk assessment.  History of Present Illness    Rachael Hancock is a 68 y.o. female who presents via audio/video conferencing for a telehealth visit today.  Pt was last seen in cardiology clinic on 07/02/2023 by Dr. Audery Hancock.  At that time Rachael Hancock was doing well .  The patient is now pending procedure as outlined above. Since her last visit, she continues to be stable from a cardiac standpoint.  Today she denies chest pain, shortness of breath, lower extremity edema, fatigue, palpitations, melena, hematuria, hemoptysis, diaphoresis, weakness, presyncope, syncope, orthopnea, and PND.   Past Medical History    Past Medical History:  Diagnosis Date   Anxiety    Arthritis    Complication of anesthesia    HA  WITH SPINAL, WISDOM TO AWHILE TO  WAKE UP WHEN YOUNGER-    Depression    GERD (gastroesophageal reflux disease)    Headache    MIGRAINE   Hypertension    Morbid obesity (HCC)    Palpitations    Reflux    Sleep apnea    BORDERLINE -  UNABLE TO USE CPAP 4-5 YRS AGO   Past Surgical History:  Procedure Laterality Date   ANKLE FRACTURE SURGERY     cyst removed     KIDNEY STONE SURGERY     NOVASURE ABLATION     TOTAL SHOULDER ARTHROPLASTY Right 01/08/2018   Procedure: RIGHT TOTAL SHOULDER ARTHROPLASTY;  Surgeon: Sammye Cristal, MD;  Location: [facility] OR;  Service: Orthopedics;  Laterality: Right;   WISDOM TOOTH EXTRACTION      Allergies  Allergies  Allergen Reactions   Red Dye Other (See Comments)   Verapamil Other (See Comments)    Constipation.     Home Medications    Prior to Admission medications   Medication Sig Start Date End Date Taking? Authorizing Provider  amLODipine  (NORVASC ) 10 MG tablet Take 1 tablet (10 mg total) by mouth daily. 07/02/23   Lenise Quince, MD  diclofenac  (VOLTAREN ) 75 MG EC tablet Take 1 tablet (75 mg total) by mouth 2 (two) times daily. NEEDS APPOINTMENT FOR FURTHER REFILLS 09/18/23   Gean Keels, MD  furosemide  (LASIX ) 20 MG tablet TAKE 1 TABLET BY MOUTH ONCE DAILY AS NEEDED FOR EDEMA 09/16/23   Rachael Draft, MD  HYDROcodone -acetaminophen  (NORCO/VICODIN) 5-325 MG tablet Take 1 tablet by mouth every 6 (six) hours as needed for moderate pain (pain score 4-6).  12/05/23   Rachael Draft, MD  lisinopril  (ZESTRIL ) 40 MG tablet Take 1 tablet (40 mg total) by mouth daily. Manufacturer Susette Erb only please 02/13/23   Rachael Draft, MD  loratadine (CLARITIN) 10 MG tablet Take 10 mg by mouth daily.    [provider]  omeprazole  (PRILOSEC) 40 MG capsule Take 1 capsule by mouth once daily 07/07/23   Rachael Draft, MD  rizatriptan  (MAXALT -MLT) 10 MG disintegrating tablet Take 1 tablet (10 mg total) by mouth as needed for migraine. May repeat in 2 hours  if needed 09/30/23   Rachael Draft, MD  rosuvastatin  (CRESTOR ) 20 MG tablet TAKE 1 TABLET BY MOUTH ONCE DAILY . APPOINTMENT REQUIRED FOR FUTURE REFILLS 08/28/23   Lenise Quince, MD  tirzepatide  (ZEPBOUND ) 10 MG/0.5ML Pen Inject 10 mg into the skin once a week. 11/07/23   Rachael Draft, MD  traMADol  (ULTRAM ) 50 MG tablet Take 1 tablet (50 mg total) by mouth in the morning, at noon, and at bedtime. 04/15/23   Gean Keels, MD    Physical Exam    Vital Signs:  Rachael Hancock does not have vital signs available for review today.  Given telephonic nature of communication, physical exam is limited. AAOx3. NAD. Normal affect.  Speech and respirations are unlabored.  Accessory Clinical Findings    None  Assessment & Plan    1.  Preoperative Cardiovascular Risk Assessment:Procedure:  Right total hip replacement   Date of Surgery:  Clearance TBD                                  Surgeon:  Dr. Neil Hancock Surgeon's Group or Practice Name:  [facility] Phone number:  [phone]  Fax number:  [phone]      Primary Cardiologist: Rachael Angel, MD  Chart reviewed as part of pre-operative protocol coverage. Given past medical history and time since last visit, based on ACC/AHA guidelines, Rachael Hancock would be at acceptable risk for the planned procedure without further cardiovascular testing.   Her RCRI is low risk, 0.9% risk of major cardiac event.  She is able to complete greater than 4 METS of physical activity.  Patient was advised that if she develops new symptoms prior to surgery to contact our office to arrange a follow-up appointment.  He verbalized understanding.  She is not taking anticoagulant therapy or antiplatelet therapy.  I will route this recommendation to the requesting party via Epic fax function and remove from pre-op pool.       Time:   Today, I have spent 5 minutes with the patient with telehealth  technology discussing medical history, symptoms, and management plan.  I spent 10 minutes reviewing patient's past cardiac history and cardiac medications.    Rachael Charity, NP  12/10/2023, 1:03 PM

## 2023-12-11 ENCOUNTER — Ambulatory Visit: Attending: Cardiovascular Disease

## 2023-12-11 DIAGNOSIS — Z01810 Encounter for preprocedural cardiovascular examination: Secondary | ICD-10-CM | POA: Diagnosis not present

## 2024-01-02 ENCOUNTER — Other Ambulatory Visit: Payer: Self-pay | Admitting: Family Medicine

## 2024-01-07 ENCOUNTER — Encounter: Payer: Self-pay | Admitting: Family Medicine

## 2024-01-09 NOTE — Telephone Encounter (Signed)
 It looks like Maxalt  has refills so she should just pick 1 of those up to have with her.  And if she needs a refill on her oxycodone  before surgery please let me know I am happy to send over.

## 2024-02-16 ENCOUNTER — Other Ambulatory Visit: Payer: Self-pay | Admitting: Family Medicine

## 2024-02-16 DIAGNOSIS — I10 Essential (primary) hypertension: Secondary | ICD-10-CM

## 2024-02-19 ENCOUNTER — Telehealth: Payer: Self-pay

## 2024-02-19 ENCOUNTER — Telehealth: Payer: Self-pay | Admitting: Cardiology

## 2024-02-19 NOTE — Telephone Encounter (Signed)
 Appointment is scheduled for 04/26/2024 @ 2:40, as patient requested, as she plans on scheduling procedure mid November.

## 2024-02-19 NOTE — Telephone Encounter (Signed)
   Name: Rachael Hancock  DOB: Jan 25, 1956  MRN: [id number]  Primary Cardiologist: Redell Shallow, MD   Preoperative team, please contact this patient and set up a phone call appointment for further preoperative risk assessment. Please obtain consent and complete medication review. Thank you for your help.  I confirm that guidance regarding antiplatelet and oral anticoagulation therapy has been completed and, if necessary, noted below.  None  I also confirmed the patient resides in the state of [place] . As per [facility] [employer] telemedicine laws, the patient must reside in the state in which the provider is licensed.   Wyn Raddle, Jackee Shove, NP 02/19/2024, 2:51 PM [facility]

## 2024-02-19 NOTE — Telephone Encounter (Signed)
  Patient Consent for Virtual Visit         Rachael Hancock has provided verbal consent on 02/19/2024 for a virtual visit (video or telephone).  Appointment is scheduled for 04/26/2024 @ 2:40, as patient requested, as she plans on scheduling procedure mid November.    CONSENT FOR VIRTUAL VISIT FOR:  Rachael Hancock  By participating in this virtual visit I agree to the following:  I hereby voluntarily request, consent and authorize [facility] and its employed or contracted physicians, physician assistants, nurse practitioners or other licensed health care professionals (the Practitioner), to provide me with telemedicine health care services (the "Services) as deemed necessary by the treating Practitioner. I acknowledge and consent to receive the Services by the Practitioner via telemedicine. I understand that the telemedicine visit will involve communicating with the Practitioner through live audiovisual communication technology and the disclosure of certain medical information by electronic transmission. I acknowledge that I have been given the opportunity to request an in-person assessment or other available alternative prior to the telemedicine visit and am voluntarily participating in the telemedicine visit.  I understand that I have the right to withhold or withdraw my consent to the use of telemedicine in the course of my care at any time, without affecting my right to future care or treatment, and that the Practitioner or I may terminate the telemedicine visit at any time. I understand that I have the right to inspect all information obtained and/or recorded in the course of the telemedicine visit and may receive copies of available information for a reasonable fee.  I understand that some of the potential risks of receiving the Services via telemedicine include:  Delay or interruption in medical evaluation due to technological equipment failure or  disruption; Information transmitted may not be sufficient (e.g. poor resolution of images) to allow for appropriate medical decision making by the Practitioner; and/or  In rare instances, security protocols could fail, causing a breach of personal health information.  Furthermore, I acknowledge that it is my responsibility to provide information about my medical history, conditions and care that is complete and accurate to the best of my ability. I acknowledge that Practitioner's advice, recommendations, and/or decision may be based on factors not within their control, such as incomplete or inaccurate data provided by me or distortions of diagnostic images or specimens that may result from electronic transmissions. I understand that the practice of medicine is not an exact science and that Practitioner makes no warranties or guarantees regarding treatment outcomes. I acknowledge that a copy of this consent can be made available to me via my patient portal [facility] MyChart), or I can request a printed copy by calling the office of [facility].    I understand that my insurance will be billed for this visit.   I have read or had this consent read to me. I understand the contents of this consent, which adequately explains the benefits and risks of the Services being provided via telemedicine.  I have been provided ample opportunity to ask questions regarding this consent and the Services and have had my questions answered to my satisfaction. I give my informed consent for the services to be provided through the use of telemedicine in my medical care

## 2024-02-19 NOTE — Telephone Encounter (Signed)
   Pre-operative Risk Assessment    Patient Name: Rachael Hancock  DOB: [birth year] MRN: [id number]   Date of last office visit: 07/02/2023 Date of next office visit: TBD   Request for Surgical Clearance    Procedure:  Left total hip replacement   Date of Surgery:  Clearance TBD                                Surgeon: Dr. Norleen Gavel Surgeon's Group or Practice Name:  [facility]  Phone number:  [phone] Fax number:  [phone]   Type of Clearance Requested:   - Medical  - Pharmacy:  Hold TBD by Card      Type of Anesthesia:  Spinal   Additional requests/questions:    SignedBernarda JONETTA Fireman   02/19/2024, 2:36 PM

## 2024-02-20 ENCOUNTER — Other Ambulatory Visit: Payer: Self-pay | Admitting: Family Medicine

## 2024-02-24 ENCOUNTER — Encounter: Payer: Self-pay | Admitting: Sports Medicine

## 2024-02-25 ENCOUNTER — Telehealth: Payer: Self-pay | Admitting: Family Medicine

## 2024-02-25 NOTE — Telephone Encounter (Signed)
 Pls call to schedule for preop appt.  Haven't seen her since April

## 2024-02-26 ENCOUNTER — Telehealth: Payer: Self-pay

## 2024-02-26 NOTE — Telephone Encounter (Signed)
 Spoke with michelle at [employer]-  Informed her of upcoming appt sched for 09/232025 She states cardiology could not schedule her until nov 3-  She is going to speak with judy the surgery coordinator and will see if this will be too long between these appts  and if one would need to be changed. She will reach back out to us  to let us  know if this is the case.

## 2024-02-26 NOTE — Telephone Encounter (Signed)
 Apppt that was schld for 03/16/2024 was showing  LT leg spot needs to be looked at /Meds refill as reason for visit -  I added the preop clearance.  Can both of these be addressed on same day?

## 2024-02-26 NOTE — Telephone Encounter (Signed)
 Spoke again with Rosaline  Was told that the clearances are good for 90 days.

## 2024-02-26 NOTE — Telephone Encounter (Signed)
 Copied from CRM #[id number]. Topic: General - Other >> Feb 26, 2024  3:38 PM Miquel SAILOR wrote: Reason for CRM: Rosaline  from [facility]/ [phone] fax: [phone]-Calling due to needs recent office and lab orders faxed over for clearance of surgery. They needs this before they schedule that with PT. Date should be after 10/23. Needs call back on update

## 2024-02-26 NOTE — Telephone Encounter (Signed)
 I guess so . Is there room to make it a 40 min?

## 2024-02-27 NOTE — Telephone Encounter (Signed)
 There is no room to make her a 40 min. Her appointment is at 810 that morning and there are no openings.

## 2024-03-02 ENCOUNTER — Ambulatory Visit: Payer: Self-pay | Admitting: Physician Assistant

## 2024-03-02 ENCOUNTER — Ambulatory Visit (INDEPENDENT_AMBULATORY_CARE_PROVIDER_SITE_OTHER): Admitting: Physician Assistant

## 2024-03-02 ENCOUNTER — Ambulatory Visit (INDEPENDENT_AMBULATORY_CARE_PROVIDER_SITE_OTHER)

## 2024-03-02 VITALS — BP 107/57 | HR 90 | Ht 68.0 in | Wt 221.0 lb

## 2024-03-02 DIAGNOSIS — M25551 Pain in right hip: Secondary | ICD-10-CM

## 2024-03-02 DIAGNOSIS — R109 Unspecified abdominal pain: Secondary | ICD-10-CM | POA: Diagnosis not present

## 2024-03-02 DIAGNOSIS — R0781 Pleurodynia: Secondary | ICD-10-CM | POA: Diagnosis not present

## 2024-03-02 DIAGNOSIS — R1012 Left upper quadrant pain: Secondary | ICD-10-CM | POA: Insufficient documentation

## 2024-03-02 LAB — POCT URINALYSIS DIP (CLINITEK)
Blood, UA: NEGATIVE
Glucose, UA: 100 mg/dL — AB
Leukocytes, UA: NEGATIVE
Nitrite, UA: NEGATIVE
POC PROTEIN,UA: 100 — AB
Spec Grav, UA: >=1.03 — AB (ref 1.010–1.025)
Urobilinogen, UA: 0.2 U/dL
pH, UA: 5.5 (ref 5.0–8.0)

## 2024-03-02 NOTE — Progress Notes (Unsigned)
   Acute Office Visit  Subjective:     Patient ID: Rachael Hancock, female    DOB: [birth year], 68 y.o.   MRN: [id number]  Chief Complaint  Patient presents with   Hip Pain    HPI Patient is in today for left sided flank pain that began 3 days ago on Saturday. The pain is radiating around to the her right front just under ribs. She has tried drinking lots of water to relieve the pain which has caused her to have frequent urination, but she denies dysuria, urinary frequency, and hematuria. Sitting in the car seems to make the pain worse, and it is relieved by laying down. She also tried Tylenol  for pain relief, but she is unsure if it provided any alleviation of the pain.  She is s/p right hip replacement 5 weeks ago. She states she still has some numbness on the side of her right leg, and she also has chronic pain in her left hip as well, but she is able to ambulate with a walker.     ROS      Objective:    BP (!) 107/57   Pulse 90   Ht 5' 8 (1.727 m)   Wt 100.2 kg   SpO2 99%   BMI 33.60 kg/m  {Vitals History (Optional):23777}  Physical Exam  No results found for any visits on 03/02/24.      Assessment & Plan: Rachael Hancock was seen today for hip pain.  Diagnoses and all orders for this visit:  Left flank pain -     POCT URINALYSIS DIP (CLINITEK) -     Urine Culture -     Lipase -     CBC w/Diff/Platelet -     CMP14+EGFR  Left upper quadrant abdominal pain -     Lipase -     CBC w/Diff/Platelet -     CMP14+EGFR  Rib pain on left side -     DG Ribs Unilateral W/Chest Left; Future  Left rib and flank pain of unknown etiology. The possible causes are as follows:  MSK etiology: Likely due to tenderness to palpation over the lowest ribs on the left side, but also unlikely due to lack of reproducible pain with movement  Rib fracture: Less likely due to lack of trauma or injury and lack of reproducible pain  Pancreatitis: Less likely due to lack of  nausea, vomiting, and epigastric pain, but somewhat likely due possible adverse effect of Zepbound  therapy and small amounts of bilirubin on UA  Nephrolithiasis: Less likely due to negative blood on UA and lack of flank tenderness today in office  Acute cystitis: Less likely due to negative nitrites and leukocytes     Problem List Items Addressed This Visit   None Visit Diagnoses       Right hip pain    -  Primary       No orders of the defined types were placed in this encounter.   No follow-ups on file.  [address] [place], Student-PA

## 2024-03-02 NOTE — Progress Notes (Signed)
 Rachael Hancock,   No rib fracture or pneumonia. Chest xray looks good.  Arthritis in the left shoulder.

## 2024-03-02 NOTE — Patient Instructions (Signed)
 Get labs today Get xray of chest/ribs down stairs Sent off for culture Keep drinking water Use heating pad over ribs

## 2024-03-03 ENCOUNTER — Encounter: Payer: Self-pay | Admitting: Physician Assistant

## 2024-03-03 LAB — CBC WITH DIFFERENTIAL/PLATELET
Basophils Absolute: 0 x10E3/uL (ref 0.0–0.2)
Basos: 1 %
EOS (ABSOLUTE): 0.2 x10E3/uL (ref 0.0–0.4)
Eos: 3 %
Hematocrit: 39.4 % (ref 34.0–46.6)
Hemoglobin: 12.9 g/dL (ref 11.1–15.9)
Immature Grans (Abs): 0 x10E3/uL (ref 0.0–0.1)
Immature Granulocytes: 0 %
Lymphocytes Absolute: 2.3 x10E3/uL (ref 0.7–3.1)
Lymphs: 38 %
MCH: 30.9 pg (ref 26.6–33.0)
MCHC: 32.7 g/dL (ref 31.5–35.7)
MCV: 95 fL (ref 79–97)
Monocytes Absolute: 0.5 x10E3/uL (ref 0.1–0.9)
Monocytes: 8 %
Neutrophils Absolute: 3.1 x10E3/uL (ref 1.4–7.0)
Neutrophils: 50 %
Platelets: 302 x10E3/uL (ref 150–450)
RBC: 4.17 x10E6/uL (ref 3.77–5.28)
RDW: 13.5 % (ref 11.7–15.4)
WBC: 6 x10E3/uL (ref 3.4–10.8)

## 2024-03-03 LAB — CMP14+EGFR
ALT: 13 IU/L (ref 0–32)
AST: 16 IU/L (ref 0–40)
Albumin: 4.1 g/dL (ref 3.9–4.9)
Alkaline Phosphatase: 112 IU/L (ref 44–121)
BUN/Creatinine Ratio: 14 (ref 12–28)
BUN: 12 mg/dL (ref 8–27)
Bilirubin Total: 0.7 mg/dL (ref 0.0–1.2)
CO2: 22 mmol/L (ref 20–29)
Calcium: 9.6 mg/dL (ref 8.7–10.3)
Chloride: 99 mmol/L (ref 96–106)
Creatinine, Ser: 0.83 mg/dL (ref 0.57–1.00)
Globulin, Total: 2.5 g/dL (ref 1.5–4.5)
Glucose: 96 mg/dL (ref 70–99)
Potassium: 3.5 mmol/L (ref 3.5–5.2)
Sodium: 137 mmol/L (ref 134–144)
Total Protein: 6.6 g/dL (ref 6.0–8.5)
eGFR: 77 mL/min/1.73 (ref >59)

## 2024-03-03 LAB — LIPASE: Lipase: 22 U/L (ref 14–72)

## 2024-03-03 NOTE — Progress Notes (Signed)
 [name],   Lipase normal. No concern with inflammation of pancreas.  Kidney, liver, glucose looks good.  Normal WBC. No signs of infection.   Labs look great.

## 2024-03-05 LAB — URINE CULTURE

## 2024-03-05 NOTE — Progress Notes (Signed)
 Urine culture shows no abnormal bacteria growth. Do you have any tramadol  or Norco left from what has been given in the past?

## 2024-03-16 ENCOUNTER — Encounter: Payer: Self-pay | Admitting: Family Medicine

## 2024-03-16 ENCOUNTER — Ambulatory Visit (INDEPENDENT_AMBULATORY_CARE_PROVIDER_SITE_OTHER): Admitting: Family Medicine

## 2024-03-16 VITALS — BP 123/64 | HR 85 | Ht 68.0 in | Wt 216.1 lb

## 2024-03-16 DIAGNOSIS — Z23 Encounter for immunization: Secondary | ICD-10-CM

## 2024-03-16 DIAGNOSIS — J0100 Acute maxillary sinusitis, unspecified: Secondary | ICD-10-CM

## 2024-03-16 DIAGNOSIS — R7309 Other abnormal glucose: Secondary | ICD-10-CM

## 2024-03-16 DIAGNOSIS — Z01818 Encounter for other preprocedural examination: Secondary | ICD-10-CM

## 2024-03-16 LAB — POCT GLYCOSYLATED HEMOGLOBIN (HGB A1C): Hemoglobin A1C: 4.7 % (ref 4.0–5.6)

## 2024-03-16 MED ORDER — AMOXICILLIN 875 MG PO TABS: 875.0000 mg | ORAL_TABLET | Freq: Two times a day (BID) | ORAL | 0 refills | Status: DC

## 2024-03-16 NOTE — Progress Notes (Unsigned)
 Established Patient Office Visit  Subjective  Patient ID: Rachael Hancock, female    DOB: 1955-11-05  Age: 68 y.o. MRN: [id number]  Chief Complaint  Patient presents with   Pre-op Exam    HPI  Here for Pre-op exam for left total hip arthroplasty.  She is doing well. Still having a lot of numbness on her right upper leg and thigh.  No recent CP or SOB. She had Cardiac clearance earlier this summer for surgery.  She has done well with surgery in the past.  Using Tramadol  for pain.    Husband in the hospital last week and he is now home.  That has been stressful.   She also reports that for the last 5 days she has had intense sinus pressure and congestion.  No fevers or chills she was in the hospital visiting her husband.   ROS    Objective:     BP 123/64   Pulse 85   Ht 5' 8 (1.727 m)   Wt 216 lb 1.3 oz (98 kg)   SpO2 99%   BMI 32.85 kg/m     Physical Exam Vitals and nursing note reviewed.  Constitutional:      Appearance: Normal appearance.  HENT:     Head: Normocephalic and atraumatic.     Right Ear: Tympanic membrane, ear canal and external ear normal.     Left Ear: Tympanic membrane, ear canal and external ear normal.     Nose: Nose normal.     Mouth/Throat:     Pharynx: Oropharynx is clear.  Eyes:     Extraocular Movements: Extraocular movements intact.     Conjunctiva/sclera: Conjunctivae normal.     Pupils: Pupils are equal, round, and reactive to light.  Neck:     Thyroid : No thyromegaly.  Cardiovascular:     Rate and Rhythm: Normal rate and regular rhythm.  Pulmonary:     Effort: Pulmonary effort is normal.     Breath sounds: Normal breath sounds.  Abdominal:     General: Bowel sounds are normal.     Palpations: Abdomen is soft.     Tenderness: There is no abdominal tenderness.  Musculoskeletal:        General: No swelling.     Cervical back: Neck supple.  Skin:    General: Skin is warm and dry.  Neurological:     Mental Status: She is  alert and oriented to person, place, and time.  Psychiatric:        Mood and Affect: Mood normal.        Behavior: Behavior normal.      Results for orders placed or performed in visit on 03/16/24  POCT HgB A1C  Result Value Ref Range   Hemoglobin A1C 4.7 4.0 - 5.6 %   HbA1c POC (<> result, manual entry)     HbA1c, POC (prediabetic range)     HbA1c, POC (controlled diabetic range)         The 10-year ASCVD risk score (Arnett DK, et al., 2019) is: 7.2%    Assessment & Plan:   Problem List Items Addressed This Visit   None Visit Diagnoses       Preop examination    -  Primary   Relevant Orders   POCT HgB A1C (Completed)   EKG 12-Lead     Abnormal glucose       Relevant Orders   POCT HgB A1C (Completed)     Acute non-recurrent maxillary sinusitis  Relevant Medications   amoxicillin  (AMOXIL ) 875 MG tablet     Encounter for immunization       Relevant Orders   Flu vaccine HIGH DOSE PF(Fluzone Trivalent) (Completed)     Encounter for immunization       Relevant Orders   Pfizer Comirnaty Covid-19 Vaccine 24yrs & older (Completed)      Preop exam  She is cleared from medical standpoint.  EKG shows rate of 75 bpm, normal sinus rhythm with no acute ST-T wave changes. - BP well controled.   Sinusitis - will tx with amoxicillin . Call if nto better in one week. Recommend nasal saline spray.   Return in about 6 months (around 09/13/2024) for Hypertension.   I spent 35 minutes on the day of the encounter to include pre-visit record review, face-to-face time with the patient and post visit ordering of test.  I spent 30 minutes on the day of the encounter to include pre-visit record review, face-to-face time with the patient and post visit ordering of test.   Dorothyann Byars, MD

## 2024-03-19 ENCOUNTER — Other Ambulatory Visit: Payer: Self-pay | Admitting: Family Medicine

## 2024-03-22 ENCOUNTER — Encounter: Payer: Self-pay | Admitting: Family Medicine

## 2024-03-22 MED ORDER — PREDNISONE 20 MG PO TABS: 40.0000 mg | ORAL_TABLET | Freq: Every day | ORAL | 0 refills | Status: DC

## 2024-04-24 ENCOUNTER — Other Ambulatory Visit: Payer: Self-pay | Admitting: Family Medicine

## 2024-04-26 ENCOUNTER — Ambulatory Visit: Attending: Cardiovascular Disease | Admitting: Cardiology

## 2024-04-26 DIAGNOSIS — Z01810 Encounter for preprocedural cardiovascular examination: Secondary | ICD-10-CM

## 2024-04-26 DIAGNOSIS — Z01818 Encounter for other preprocedural examination: Secondary | ICD-10-CM | POA: Diagnosis present

## 2024-04-26 NOTE — Progress Notes (Signed)
 Virtual Visit via Telephone Note   Because of Rachael Hancock co-morbid illnesses, she is at least at moderate risk for complications without adequate follow up.  This format is felt to be most appropriate for this patient at this time.  Due to technical limitations with video connection (technology), today's appointment will be conducted as an audio only telehealth visit, and [name] verbally agreed to proceed in this manner.   All issues noted in this document were discussed and addressed.  No physical exam could be performed with this format.  Evaluation Performed:  Preoperative cardiovascular risk assessment _____________   Date:  04/26/2024   Patient ID:  Rachael Hancock, DOB 08-Mar-1956, MRN [id number] Patient Location:  Home Provider location:   Office  Primary Care Provider:  Alvan Dorothyann BIRCH, MD Primary Cardiologist:  Redell Shallow, MD  Chief Complaint / Patient Profile   68 y.o. y/o female with a h/o palpitations/PACs/PVCs, elevated coronary artery calcium  score in 2021, hypertension who is pending left total hip date TBD, and presents today for telephonic preoperative cardiovascular risk assessment.  History of Present Illness    Rachael Hancock is a 68 y.o. female who presents via audio/video conferencing for a telehealth visit today.  Pt was last seen in cardiology clinic on 07/02/2023 by Dr. Redell Shallow.  At that time [facility] was doing well, blood pressure was controlled, palpitations were quiescent, no changes made to medications or plan of care and she would follow-up in 12 months.  The patient is now pending procedure as outlined above. Since her last visit, she has been doing well over all. She underwent right hip replacement over the summer and she is doing well from that perspective. She denies chest pain, palpitations, dyspnea, pnd, orthopnea, n, v, dizziness, syncope, edema, weight gain, or early satiety.    Past  Medical History    Past Medical History:  Diagnosis Date   Anxiety    Arthritis    Complication of anesthesia    HA  WITH SPINAL, WISDOM TO AWHILE TO WAKE UP WHEN YOUNGER-    Depression    GERD (gastroesophageal reflux disease)    Headache    MIGRAINE   Hypertension    Morbid obesity (HCC)    Palpitations    Reflux    Sleep apnea    BORDERLINE -  UNABLE TO USE CPAP 4-5 YRS AGO   Past Surgical History:  Procedure Laterality Date   ANKLE FRACTURE SURGERY     cyst removed     KIDNEY STONE SURGERY     NOVASURE ABLATION     TOTAL SHOULDER ARTHROPLASTY Right 01/08/2018   Procedure: RIGHT TOTAL SHOULDER ARTHROPLASTY;  Surgeon: Dozier Soulier, MD;  Location: [facility] OR;  Service: Orthopedics;  Laterality: Right;   WISDOM TOOTH EXTRACTION      Allergies  Allergies  Allergen Reactions   Red Dye Other (See Comments)   Verapamil Other (See Comments)    Constipation.     Home Medications    Prior to Admission medications   Medication Sig Start Date End Date Taking? Authorizing Provider  amLODipine  (NORVASC ) 10 MG tablet Take 1 tablet (10 mg total) by mouth daily. 07/02/23   Shallow Redell RAMAN, MD  amoxicillin  (AMOXIL ) 875 MG tablet Take 1 tablet (875 mg total) by mouth 2 (two) times daily. 03/16/24   Alvan Dorothyann BIRCH, MD  diclofenac  (VOLTAREN ) 75 MG EC tablet Take 1 tablet (75 mg total) by mouth 2 (two) times daily.  NEEDS APPOINTMENT FOR FURTHER REFILLS 09/18/23   Curtis Debby PARAS, MD  lisinopril  (ZESTRIL ) 40 MG tablet Take 1 tablet by mouth once daily 02/16/24   Metheney, Catherine D, MD  loratadine (CLARITIN) 10 MG tablet Take 10 mg by mouth daily.    [provider]  omeprazole  (PRILOSEC) 40 MG capsule Take 1 capsule by mouth once daily 07/07/23   Alvan Dorothyann BIRCH, MD  predniSONE  (DELTASONE ) 20 MG tablet Take 2 tablets (40 mg total) by mouth daily with breakfast. 03/22/24   Alvan Dorothyann BIRCH, MD  rizatriptan  (MAXALT -MLT) 10 MG disintegrating tablet Take 1  tablet (10 mg total) by mouth as needed for migraine. May repeat in 2 hours if needed 09/30/23   Alvan Dorothyann BIRCH, MD  rosuvastatin  (CRESTOR ) 20 MG tablet TAKE 1 TABLET BY MOUTH ONCE DAILY . APPOINTMENT REQUIRED FOR FUTURE REFILLS 08/28/23   Pietro Redell RAMAN, MD  tiZANidine (ZANAFLEX) 2 MG tablet Take 2 mg by mouth every 8 (eight) hours as needed. 01/01/24   [provider]  traMADol  (ULTRAM ) 50 MG tablet Take 1 tablet (50 mg total) by mouth in the morning, at noon, and at bedtime. 04/15/23   Curtis Debby PARAS, MD  ZEPBOUND  10 MG/0.5ML Pen INJECT 10 MG SUBCUTANEOUSLY ONCE A WEEK 03/25/24   Alvan Dorothyann BIRCH, MD    Physical Exam    Vital Signs: Patient took her blood pressure today in preparation for this visit and reports it is 107/71.  Given telephonic nature of communication, physical exam is limited. AAOx3. NAD. Normal affect.  Speech and respirations are unlabored.  Accessory Clinical Findings    None  Assessment & Plan    1.  Preoperative Cardiovascular Risk Assessment: According to the Revised Cardiac Risk Index (RCRI), her Perioperative Risk of Major Cardiac Event is (%): 0.4 Her Functional Capacity in METs is: 5.38 according to the Duke Activity Status Index (DASI). Therefore, based on ACC/AHA guidelines, patient would be at acceptable risk for the planned procedure without further cardiovascular testing. I will route this recommendation to the requesting party via Epic fax function.   The patient was advised that if she develops new symptoms prior to surgery to contact our office to arrange for a follow-up visit, and she verbalized understanding.    A copy of this note will be routed to requesting surgeon.  Time:   Today, I have spent 10 minutes with the patient with telehealth technology discussing medical history, symptoms, and management plan.     Delon JAYSON Hoover, NP  04/26/2024, 9:10 AM

## 2024-04-28 ENCOUNTER — Encounter: Payer: Self-pay | Admitting: Family Medicine

## 2024-05-14 ENCOUNTER — Other Ambulatory Visit: Payer: Self-pay | Admitting: Cardiology

## 2024-05-14 DIAGNOSIS — R931 Abnormal findings on diagnostic imaging of heart and coronary circulation: Secondary | ICD-10-CM

## 2024-05-31 ENCOUNTER — Ambulatory Visit: Admitting: Family Medicine

## 2024-05-31 ENCOUNTER — Encounter: Payer: Self-pay | Admitting: Family Medicine

## 2024-05-31 VITALS — BP 95/50 | HR 98 | Ht 68.0 in | Wt 207.0 lb

## 2024-05-31 DIAGNOSIS — L290 Pruritus ani: Secondary | ICD-10-CM

## 2024-05-31 DIAGNOSIS — K146 Glossodynia: Secondary | ICD-10-CM

## 2024-05-31 DIAGNOSIS — M79605 Pain in left leg: Secondary | ICD-10-CM | POA: Diagnosis not present

## 2024-05-31 DIAGNOSIS — K649 Unspecified hemorrhoids: Secondary | ICD-10-CM

## 2024-05-31 DIAGNOSIS — J029 Acute pharyngitis, unspecified: Secondary | ICD-10-CM

## 2024-05-31 MED ORDER — NYSTATIN 100000 UNIT/ML MT SUSP: 6.0000 mL | Freq: Four times a day (QID) | OROMUCOSAL | 0 refills | Status: AC

## 2024-05-31 MED ORDER — GABAPENTIN 100 MG PO CAPS: 100.0000 mg | ORAL_CAPSULE | Freq: Every day | ORAL | 3 refills | Status: DC

## 2024-05-31 MED ORDER — HYDROCORTISONE (PERIANAL) 2.5 % EX CREA: 1.0000 | TOPICAL_CREAM | Freq: Two times a day (BID) | CUTANEOUS | 1 refills | Status: AC

## 2024-05-31 NOTE — Progress Notes (Signed)
 Acute Office Visit  Patient ID: Rachael Hancock, female    DOB: 03-14-1956, 68 y.o.   MRN: [id number]  PCP: Alvan Rachael BIRCH, MD  Chief Complaint  Patient presents with   Sore Throat    Subjective:     HPI  Discussed the use of AI scribe software for clinical note transcription with the patient, who gave verbal consent to proceed.  History of Present Illness Rachael Hancock Catheryn is a 68 year old female who presents with burning pain and numbness in the leg post joint replacement surgery.  Postoperative lower extremity neuropathic pain - Severe burning pain in the upper leg following joint replacement surgery - Pain is worse compared to previous joint replacement experience - Burning sensation is intense and prevents lying comfortably in bed; currently sleeping in a chair - Current leg is less numb than the first leg, but burning is more severe  Oral burning and dysgeusia - Burning sensation in mouth and tongue, similar to a burnt tongue - Altered taste perception; everything tastes unusual, including water - Burning sensation triggered by soda, tea, and orange juice - Sensation of coating in mouth and throat, described as aggravating  Medication use and adverse effects - Currently taking hydrocodone ; last dose at 10 AM today - Oxycodone  previously caused significant hypotension, so she avoids it  Hydration and oral intake difficulties - Difficulty maintaining hydration due to altered taste and oral burning - Switched toothpaste due to suspected sensitivity, but burning persists  Postoperative supplementation - Currently taking vitamin C as previously recommended post-surgery   ROS     Objective:    BP (!) 95/50   Pulse 98   Ht 5' 8 (1.727 m)   Wt 207 lb (93.9 kg)   SpO2 100%   BMI 31.47 kg/m    Physical Exam Vitals reviewed.  Constitutional:      Appearance: Normal appearance.  HENT:     Head: Normocephalic.     Mouth/Throat:      Mouth: Mucous membranes are pale and dry. No oral lesions.     Pharynx: No oropharyngeal exudate or posterior oropharyngeal erythema.  Pulmonary:     Effort: Pulmonary effort is normal.  Neurological:     Mental Status: She is alert and oriented to person, place, and time.  Psychiatric:        Mood and Affect: Mood normal.        Behavior: Behavior normal.       No results found for any visits on 05/31/24.     Assessment & Plan:   Problem List Items Addressed This Visit       Cardiovascular and Mediastinum   Hemorrhoids   Relevant Medications   aspirin  EC 325 MG tablet   hydrocortisone  (ANUSOL -HC) 2.5 % rectal cream   Other Visit Diagnoses       Sore throat    -  Primary   Relevant Medications   nystatin  (MYCOSTATIN ) 100000 UNIT/ML suspension   Other Relevant Orders   Vitamin B1   Vitamin B6   B12     Tongue burning sensation       Relevant Medications   nystatin  (MYCOSTATIN ) 100000 UNIT/ML suspension   Other Relevant Orders   Vitamin B1   Vitamin B6   B12     Left leg pain       Relevant Medications   gabapentin  (NEURONTIN ) 100 MG capsule   Other Relevant Orders   Vitamin B1   Vitamin B6  B12     Anal itching       Relevant Medications   hydrocortisone  (ANUSOL -HC) 2.5 % rectal cream       Assessment and Plan Assessment & Plan Postoperative neuropathic pain of lower extremity Postoperative neuropathic pain in the right upper leg and thigh, likely due to nerve stretching during surgery. Current pain management with hydrocodone  is insufficient for nerve pain. - Initiated gabapentin  with a tapering schedule: 100 mg at bedtime tonight, then 100 mg in the morning and 100 mg at lunch tomorrow, and 200 mg at bedtime. Adjust as needed, with a maximum of 300 mg at bedtime. - Continue hydrocodone  as needed, but monitor for sedation and hypotension.  Oral candidiasis (suspected) and burning mouth syndrome Suspected oral candidiasis with symptoms of burning  tongue and altered taste, possibly exacerbated by recent anesthesia. Differential includes burning mouth syndrome, potentially related to vitamin deficiencies. - Prescribed nystatin  swish and swallow for suspected oral candidiasis. - Ordered B vitamin level tests to assess for deficiencies. - Advised to stop vitamin C temporarily to assess impact on symptoms.  Hemorrhoids - Refilled cortisone cream for hemorrhoid management.    Meds ordered this encounter  Medications   nystatin  (MYCOSTATIN ) 100000 UNIT/ML suspension    Sig: Take 6 mLs (600,000 Units total) by mouth 4 (four) times daily. X 1 week. Swish and hold in mouth for at least 2 minutes and then swallow.    Dispense:  473 mL    Refill:  0   gabapentin  (NEURONTIN ) 100 MG capsule    Sig: Take 1-3 capsules (100-300 mg total) by mouth at bedtime.    Dispense:  90 capsule    Refill:  3   hydrocortisone  (ANUSOL -HC) 2.5 % rectal cream    Sig: Place 1 Application rectally 2 (two) times daily. For hemorrhoids.    Dispense:  28 g    Refill:  1    No follow-ups on file.  Rachael Byars, MD [facility] Medicine at [facility]

## 2024-06-02 ENCOUNTER — Encounter: Payer: Self-pay | Admitting: Family Medicine

## 2024-06-02 DIAGNOSIS — M79605 Pain in left leg: Secondary | ICD-10-CM

## 2024-06-02 MED ORDER — GABAPENTIN 300 MG PO CAPS: 300.0000 mg | ORAL_CAPSULE | Freq: Three times a day (TID) | ORAL | 1 refills | Status: AC

## 2024-06-07 ENCOUNTER — Ambulatory Visit: Payer: Self-pay | Admitting: Family Medicine

## 2024-06-07 LAB — VITAMIN B6: Vitamin B6: 4.8 ug/L (ref 3.4–65.2)

## 2024-06-07 LAB — VITAMIN B12: Vitamin B-12: 388 pg/mL (ref 232–1245)

## 2024-06-07 LAB — VITAMIN B1: Thiamine: 77.6 nmol/L (ref 66.5–200.0)

## 2024-06-07 NOTE — Progress Notes (Signed)
 Vitamin B1 looks great.  Vitamin B6 is on the low end so I would recommend picking up some B6 and starting to take that daily.  B12 level is also on the low end of normal so to also start some B12.  Do think both of these will help with your energy level.  And my hope is that it will help with the tongue burning as well.

## 2024-06-11 ENCOUNTER — Ambulatory Visit: Payer: Self-pay

## 2024-06-11 NOTE — Telephone Encounter (Signed)
 Patient is scheduled for Monday 06/14/2024 at 9:10am with Dr. Alvan . -  is this too long to wait? Should patient be seen at urgent care ? Patient is post surgery and already taking cephalaxin 500mg  .

## 2024-06-11 NOTE — Telephone Encounter (Signed)
 FYI Only or Action Required?: Action required by provider: request for appointment.  Patient was last seen in primary care on 05/31/2024 by Alvan Dorothyann BIRCH, MD.  Called Nurse Triage reporting Sinusitis.  Symptoms began several days ago.  Interventions attempted: Prescription medications: Cephala.  Symptoms are: stable.  Triage Disposition: See Physician Within 24 Hours  Patient/caregiver understands and will follow disposition?: Yes    Copied from CRM #[id number]. Topic: Clinical - Red Word Triage >> Jun 11, 2024  9:25 AM Willma SAUNDERS wrote: Red Word that prompted transfer to Nurse Triage: Patient thinks she has a sinus infection, has a lot of congestion and drainage. Patient also states she had hips surgery a few weeks ago and has swelling in both legs, also has been having muscle cramps and spasms.      Reason for Disposition  Earache  Answer Assessment - Initial Assessment Questions Patient says she's started having sinus congestion on Wednesday, clear drainage, nasal is blocked at night but not so much during the day. She's used warm compresses on the face to help with the congestion at night, not able to sleep well due to this, slight sore throat yesterday not today, headache today, no facial soreness or tenderness. Swelling to both legs, muscle spasms/cramping, nerve pain, unable to sleep at night, being followed by ortho post surgery. She's currently on Cephalaxin 500 mg Q6h prescribed by ortho PA due to drawing off fluid from leg on last Friday, started med on 12/15. She says she would like to see Dr. Alvan or someone at the practice today if possible so that she doesn't go through the weekend without anything. Advised earliest is on Monday 12/22. Advised appointment with Jade at 1620. She says she will call and try to change her therapy appointment and call the office back to schedule. She hung up before providing care advice, but patient is agreeable to a Monday  appointment.   1. LOCATION: Where does it hurt?      Headache  2. ONSET: When did the sinus pain start?  (e.g., hours, days)      Wednesday  3. SEVERITY: How bad is the pain?   (Scale 0-10; or none, mild, moderate or severe)     3   5. NASAL CONGESTION: Is the nose blocked? If Yes, ask: Can you open it or must you breathe through your mouth?     At night nose is blocked, sleep with mouth open  6. NASAL DISCHARGE: Do you have discharge from your nose? If so ask, What color?     Clear  7. FEVER: Do you have a fever? If Yes, ask: What is it, how was it measured, and when did it start?      No  8. OTHER SYMPTOMS: Do you have any other symptoms? (e.g., sore throat, cough, earache, difficulty breathing)     Sore throat yesterday not today  Protocols used: Sinus Pain or Congestion-A-AH

## 2024-06-14 ENCOUNTER — Ambulatory Visit: Admitting: Family Medicine

## 2024-06-14 ENCOUNTER — Encounter: Payer: Self-pay | Admitting: Family Medicine

## 2024-06-14 VITALS — BP 130/60 | HR 83 | Ht 68.0 in | Wt 207.0 lb

## 2024-06-14 DIAGNOSIS — K146 Glossodynia: Secondary | ICD-10-CM | POA: Diagnosis not present

## 2024-06-14 DIAGNOSIS — M7989 Other specified soft tissue disorders: Secondary | ICD-10-CM

## 2024-06-14 DIAGNOSIS — M62838 Other muscle spasm: Secondary | ICD-10-CM

## 2024-06-14 DIAGNOSIS — R0981 Nasal congestion: Secondary | ICD-10-CM

## 2024-06-14 NOTE — Progress Notes (Signed)
 "  Acute Office Visit  Patient ID: Rachael Hancock, female    DOB: 08-17-1955, 68 y.o.   MRN: [id number]  PCP: Alvan Dorothyann BIRCH, MD  Chief Complaint  Patient presents with   Sinusitis    X4 days.    Subjective:     HPI  Discussed the use of AI scribe software for clinical note transcription with the patient, who gave verbal consent to proceed.  History of Present Illness Rachael Hancock is a 68 year old female who presents with sinus congestion and sleep disturbances.  Sinus congestion and associated symptoms - Significant sinus congestion described as feeling 'stopped up' and unable to breathe at night - Accompanied by hoarseness and headaches - Currently taking cephalexin , but symptoms have not resolved  Sleep disturbances and neuromuscular symptoms - Sleep disturbances characterized by being jerked awake at night - Uncertain if episodes are due to muscle or nerve issues - Alternates sleeping in chair, couch, and bed, but continues to be woken by muscle contractions - Regularly taking gabapentin  for nerve pain - Hydrocodone  available for pain relief, but not used frequently - Methocarbamol  and tizanidine used as needed for muscle relaxation  Lower extremity edema - Significant edema, particularly on top of left ant thigh near groin crease, described as 'hard as a rock' - Presence of a knot behind the knee that was unsuccessfully drained at ortho office - Edema is persistent and impacting mobility - Able to shower and move around with some assistance  Burning tongue and vitamin deficiency - History of burning tongue related to B12 and B6 deficiency - Currently taking supplements for B12 and B6 - Reports improvement in symptoms, but still experiences some discomfort   ROS     Objective:    BP 130/60   Pulse 83   Ht 5' 8 (1.727 m)   Wt 207 lb (93.9 kg)   SpO2 100%   BMI 31.47 kg/m    Physical Exam Vitals and nursing note reviewed.   Constitutional:      Appearance: Normal appearance.  HENT:     Head: Normocephalic and atraumatic.     Right Ear: Tympanic membrane, ear canal and external ear normal. There is no impacted cerumen.     Left Ear: Tympanic membrane, ear canal and external ear normal. There is no impacted cerumen.     Nose: Nose normal.     Mouth/Throat:     Pharynx: Oropharynx is clear.  Eyes:     Conjunctiva/sclera: Conjunctivae normal.  Cardiovascular:     Rate and Rhythm: Normal rate and regular rhythm.  Pulmonary:     Effort: Pulmonary effort is normal.     Breath sounds: Normal breath sounds.  Musculoskeletal:     Cervical back: Neck supple. No tenderness.  Lymphadenopathy:     Cervical: No cervical adenopathy.  Skin:    General: Skin is warm and dry.  Neurological:     Mental Status: She is alert and oriented to person, place, and time.  Psychiatric:        Mood and Affect: Mood normal.       No results found for any visits on 06/14/24.     Assessment & Plan:   Problem List Items Addressed This Visit   None Visit Diagnoses       Nasal congestion    -  Primary     Leg muscle spasm         Leg swelling  Tongue burning sensation           Assessment and Plan Assessment & Plan Allergic rhinitis Nasal congestion and hoarseness likely due to allergies, not sinus infection. - Use Flonase  or Nasonex nasal spray, two sprays in each nostril once daily. - Perform saline nasal spray before using Flonase  to clear nasal passages. - Monitor symptoms and contact provider if no improvement after completing antibiotics.  Chronic pain with muscle spasm Chronic pain with muscle spasms possibly due to nerve irritation. Gabapentin  used for nerve pain. Muscle relaxers used as needed. - Continue gabapentin  three times daily. - Take two tizanidine tablets at bedtime tonight. - Consider methocarbamol  during the day if needed. - Perform gentle stretching exercises before  bed.  Localized edema of lower extremity Localized edema possibly positional with a hard knot behind the knee. Edema may persist post-surgery. - Elevate leg to the height of the head for 20 minutes during late morning, after lunch, and late afternoon. - Perform gentle massage on the affected area to help break up the knot. - Continue mobility exercises and avoid prolonged standing.  Vitamin B12 and B6 deficiency Improvement in symptoms with supplementation. - Continue taking B12 and B6 supplements.    No orders of the defined types were placed in this encounter.   No follow-ups on file.  Dorothyann Byars, MD [facility] Primary Care & Sports Medicine at [facility]   "

## 2024-06-25 ENCOUNTER — Other Ambulatory Visit: Payer: Self-pay | Admitting: Cardiology

## 2024-06-26 ENCOUNTER — Other Ambulatory Visit: Payer: Self-pay | Admitting: Family Medicine

## 2024-07-08 ENCOUNTER — Other Ambulatory Visit: Payer: Self-pay | Admitting: Family Medicine

## 2024-07-12 ENCOUNTER — Ambulatory Visit: Admitting: Family Medicine

## 2024-07-12 ENCOUNTER — Encounter: Payer: Self-pay | Admitting: Family Medicine

## 2024-07-12 VITALS — BP 87/60 | HR 60 | Ht 68.0 in | Wt 207.0 lb

## 2024-07-12 DIAGNOSIS — M5136 Other intervertebral disc degeneration, lumbar region with discogenic back pain only: Secondary | ICD-10-CM

## 2024-07-12 DIAGNOSIS — M545 Low back pain, unspecified: Secondary | ICD-10-CM

## 2024-07-12 MED ORDER — TIZANIDINE HCL 2 MG PO TABS: 2.0000 mg | ORAL_TABLET | Freq: Three times a day (TID) | ORAL | 0 refills | Status: AC | PRN

## 2024-07-12 MED ORDER — ACETAMINOPHEN-CODEINE 300-30 MG PO TABS: 1.0000 | ORAL_TABLET | Freq: Four times a day (QID) | ORAL | 0 refills | Status: AC | PRN

## 2024-07-12 NOTE — Progress Notes (Addendum)
 "  Acute Office Visit  Patient ID: Rachael Hancock, female    DOB: 07/02/1955, 69 y.o.   MRN: [id number]  PCP: Alvan Dorothyann BIRCH, MD  Chief Complaint  Patient presents with   Hospitalization Follow-up    Subjective:     HPI  Discussed the use of AI scribe software for clinical note transcription with the patient, who gave verbal consent to proceed.  History of Present Illness Rachael Hancock Rachael Hancock is a 69 year old female with severe disc degeneration and facet arthrosis who presents with severe back pain.  Acute severe back pain - Onset Saturday morning with severe pain localized to the left side of the back - Pain described as a 'vice' that grips when attempting movement, especially when getting up or rolling over in bed - Movement is difficult and causes her to catch her breath - Requires support to stand due to pain - No prior history of disc issues, but previously informed of arthritis in the back  Spinal degeneration and arthrosis - CT scan and prior imaging show severe disc degeneration at T11-12, L1-2, and L5-S1 - Moderate facet arthrosis present in the lumbar spine  Renal calculus - CT scan confirmed non-obstructive kidney stone in the left kidney  Constipation - Constipation noted during clinic visit - Has taken a laxative for symptom management  Pain management and medication effects - Using Tylenol  with codeine  for pain control, which aids sleep - Experienced low blood pressure after taking pain medication  Impaired mobility and hip history - History of hip surgery with intraoperative fracture discovered - Currently undergoing physical therapy for hips - Hip pain and prior fracture affect current mobility and pain tolerance   ROS     Objective:    BP (!) 87/60   Pulse 60   Ht 5' 8 (1.727 m)   Wt 207 lb (93.9 kg)   SpO2 99%   BMI 31.47 kg/m    Physical Exam Vitals reviewed.  Constitutional:      Appearance: Normal appearance.   HENT:     Head: Normocephalic.  Pulmonary:     Effort: Pulmonary effort is normal.  Musculoskeletal:     Comments: Nontender over the lumbar spine, tight paraspinous muscles over the low back. Nontender over the SI joints.  Pain with rotation of lumbar spine.  Pain with extension. Pain with going from sitting to standing.   Neurological:     Mental Status: She is alert and oriented to person, place, and time.  Psychiatric:        Mood and Affect: Mood normal.        Behavior: Behavior normal.       No results found for any visits on 07/12/24.     Assessment & Plan:   Problem List Items Addressed This Visit   None Visit Diagnoses       Acute midline low back pain without sciatica    -  Primary   Relevant Medications   acetaminophen -codeine  (TYLENOL  #3) 300-30 MG tablet   tiZANidine  (ZANAFLEX ) 2 MG tablet   Other Relevant Orders   Ambulatory referral to Physical Therapy     Degeneration of intervertebral disc of lumbar region with discogenic back pain       Relevant Medications   acetaminophen -codeine  (TYLENOL  #3) 300-30 MG tablet   tiZANidine  (ZANAFLEX ) 2 MG tablet       Assessment and Plan Assessment & Plan Lumbar disc degeneration with facet arthrosis Severe disc degeneration at T11-T12, L1-2,  and L5-S1 with moderate facet arthrosis bilaterally. Pain likely due to nerve impingement, causing severe pain and muscle spasms. - Prescribed tizanidine  2 mg for muscle relaxation. - Continue Tylenol  with codeine  for pain. - Initiated physical therapy including stretches and ultrasound. - Advised to avoid prolonged sitting or standing; move every hour. - Encouraged upper body stretches.  Constipation Moderate stool volume in colon likely contributing to back pain by pressing on spine. Symptoms include infrequent bowel movements and sensation of incomplete evacuation. - Continue MiraLAX and Citrucel. - Increase water intake. - Encouraged regular bowel  movements.  Nonobstructive kidney stone Left kidney stone not causing pain as it is nonobstructive. History of painful kidney stone episodes. - Consider lemon juice intake to reduce stone size.  History of hip fracture and hip arthroplasty Persistent swelling and discomfort post-arthroplasty. Concerns about bone brittleness and potential need for osteoporosis medication. - Discuss bone health and osteoporosis medication with orthopedic surgeon in February. - Continue physical therapy for hip. - Use walker or cane for mobility; avoid walking alone.    Meds ordered this encounter  Medications   acetaminophen -codeine  (TYLENOL  #3) 300-30 MG tablet    Sig: Take 1-2 tablets by mouth every 6 (six) hours as needed for up to 5 days for moderate pain (pain score 4-6).    Dispense:  20 tablet    Refill:  0   tiZANidine  (ZANAFLEX ) 2 MG tablet    Sig: Take 1 tablet (2 mg total) by mouth every 8 (eight) hours as needed.    Dispense:  30 tablet    Refill:  0    No follow-ups on file.  Dorothyann Byars, MD [facility]   "

## 2024-07-22 ENCOUNTER — Encounter: Payer: Self-pay | Admitting: Family Medicine

## 2024-07-22 ENCOUNTER — Other Ambulatory Visit (HOSPITAL_COMMUNITY): Payer: Self-pay

## 2024-07-22 NOTE — Telephone Encounter (Signed)
Does it need a PA?

## 2024-07-22 NOTE — Telephone Encounter (Signed)
 Pharmacy Patient Advocate Encounter   Received notification from Physician's Office that prior authorization for ZEP[employer]OUND  is required/requested.   Insurance verification completed.   The patient is insured through [employer].   Per test claim: The current 28 day co-pay is, $725.51.  No PA needed at this time. This test claim was processed through [facility]- copay amounts may vary at other pharmacies due to pharmacy/plan contracts, or as the patient moves through the different stages of their insurance plan.

## 2024-09-13 ENCOUNTER — Ambulatory Visit: Admitting: Family Medicine

## 2024-09-13 ENCOUNTER — Ambulatory Visit: Admitting: Cardiology
